# Patient Record
Sex: Male | Born: 1970 | Race: Black or African American | Hispanic: No | Marital: Single | Smoking: Former smoker
Health system: Southern US, Community
[De-identification: ages and names within clinical notes are randomized; demographics above are authoritative.]

## PROBLEM LIST (undated history)

## (undated) ENCOUNTER — Emergency Department (HOSPITAL_COMMUNITY): Admission: EM | Payer: MEDICAID

---

## 1997-06-27 ENCOUNTER — Emergency Department (HOSPITAL_COMMUNITY): Admission: EM | Admit: 1997-06-27 | Discharge: 1997-06-27 | Payer: Self-pay | Admitting: Emergency Medicine

## 1999-05-25 ENCOUNTER — Encounter: Payer: Self-pay | Admitting: Emergency Medicine

## 1999-05-25 ENCOUNTER — Emergency Department (HOSPITAL_COMMUNITY): Admission: EM | Admit: 1999-05-25 | Discharge: 1999-05-25 | Payer: Self-pay | Admitting: Emergency Medicine

## 2000-07-24 ENCOUNTER — Emergency Department (HOSPITAL_COMMUNITY): Admission: EM | Admit: 2000-07-24 | Discharge: 2000-07-24 | Payer: Self-pay | Admitting: Emergency Medicine

## 2000-09-01 ENCOUNTER — Emergency Department (HOSPITAL_COMMUNITY): Admission: EM | Admit: 2000-09-01 | Discharge: 2000-09-01 | Payer: Self-pay | Admitting: Internal Medicine

## 2000-10-16 ENCOUNTER — Emergency Department (HOSPITAL_COMMUNITY): Admission: EM | Admit: 2000-10-16 | Discharge: 2000-10-17 | Payer: Self-pay

## 2001-07-22 ENCOUNTER — Emergency Department (HOSPITAL_COMMUNITY): Admission: EM | Admit: 2001-07-22 | Discharge: 2001-07-22 | Payer: Self-pay | Admitting: Emergency Medicine

## 2002-07-22 ENCOUNTER — Inpatient Hospital Stay (HOSPITAL_COMMUNITY): Admission: EM | Admit: 2002-07-22 | Discharge: 2002-07-23 | Payer: Self-pay | Admitting: Emergency Medicine

## 2002-07-22 ENCOUNTER — Encounter: Payer: Self-pay | Admitting: Emergency Medicine

## 2002-08-06 ENCOUNTER — Emergency Department (HOSPITAL_COMMUNITY): Admission: EM | Admit: 2002-08-06 | Discharge: 2002-08-06 | Payer: Self-pay | Admitting: Emergency Medicine

## 2007-06-12 ENCOUNTER — Encounter: Admission: RE | Admit: 2007-06-12 | Discharge: 2007-06-12 | Payer: Self-pay | Admitting: Internal Medicine

## 2020-05-24 ENCOUNTER — Emergency Department (HOSPITAL_COMMUNITY): Payer: Medicaid Other

## 2020-05-24 ENCOUNTER — Other Ambulatory Visit: Payer: Self-pay

## 2020-05-24 ENCOUNTER — Inpatient Hospital Stay (HOSPITAL_COMMUNITY)
Admission: EM | Admit: 2020-05-24 | Discharge: 2020-06-09 | DRG: 871 | Disposition: A | Discharge status: Home Health | Payer: Medicaid Other | Attending: Internal Medicine | Admitting: Internal Medicine

## 2020-05-24 DIAGNOSIS — J180 Bronchopneumonia, unspecified organism: Secondary | ICD-10-CM | POA: Diagnosis present

## 2020-05-24 DIAGNOSIS — N179 Acute kidney failure, unspecified: Secondary | ICD-10-CM

## 2020-05-24 DIAGNOSIS — A4101 Sepsis due to Methicillin susceptible Staphylococcus aureus: Principal | ICD-10-CM | POA: Diagnosis present

## 2020-05-24 DIAGNOSIS — J869 Pyothorax without fistula: Secondary | ICD-10-CM | POA: Diagnosis present

## 2020-05-24 DIAGNOSIS — E43 Unspecified severe protein-calorie malnutrition: Secondary | ICD-10-CM | POA: Diagnosis present

## 2020-05-24 DIAGNOSIS — Z9889 Other specified postprocedural states: Secondary | ICD-10-CM

## 2020-05-24 DIAGNOSIS — L03114 Cellulitis of left upper limb: Secondary | ICD-10-CM | POA: Diagnosis present

## 2020-05-24 DIAGNOSIS — R509 Fever, unspecified: Secondary | ICD-10-CM

## 2020-05-24 DIAGNOSIS — J918 Pleural effusion in other conditions classified elsewhere: Secondary | ICD-10-CM | POA: Diagnosis present

## 2020-05-24 DIAGNOSIS — J9601 Acute respiratory failure with hypoxia: Secondary | ICD-10-CM

## 2020-05-24 DIAGNOSIS — I76 Septic arterial embolism: Secondary | ICD-10-CM | POA: Diagnosis present

## 2020-05-24 DIAGNOSIS — R6520 Severe sepsis without septic shock: Secondary | ICD-10-CM

## 2020-05-24 DIAGNOSIS — E861 Hypovolemia: Secondary | ICD-10-CM | POA: Diagnosis present

## 2020-05-24 DIAGNOSIS — F17290 Nicotine dependence, other tobacco product, uncomplicated: Secondary | ICD-10-CM | POA: Diagnosis present

## 2020-05-24 DIAGNOSIS — Z4682 Encounter for fitting and adjustment of non-vascular catheter: Secondary | ICD-10-CM

## 2020-05-24 DIAGNOSIS — J15211 Pneumonia due to Methicillin susceptible Staphylococcus aureus: Secondary | ICD-10-CM | POA: Diagnosis present

## 2020-05-24 DIAGNOSIS — E876 Hypokalemia: Secondary | ICD-10-CM | POA: Diagnosis not present

## 2020-05-24 DIAGNOSIS — I82612 Acute embolism and thrombosis of superficial veins of left upper extremity: Secondary | ICD-10-CM | POA: Diagnosis present

## 2020-05-24 DIAGNOSIS — R0902 Hypoxemia: Secondary | ICD-10-CM

## 2020-05-24 DIAGNOSIS — J90 Pleural effusion, not elsewhere classified: Secondary | ICD-10-CM

## 2020-05-24 DIAGNOSIS — Z20822 Contact with and (suspected) exposure to covid-19: Secondary | ICD-10-CM | POA: Diagnosis present

## 2020-05-24 DIAGNOSIS — D649 Anemia, unspecified: Secondary | ICD-10-CM | POA: Diagnosis present

## 2020-05-24 DIAGNOSIS — N1832 Chronic kidney disease, stage 3b: Secondary | ICD-10-CM | POA: Diagnosis present

## 2020-05-24 DIAGNOSIS — F419 Anxiety disorder, unspecified: Secondary | ICD-10-CM | POA: Diagnosis present

## 2020-05-24 DIAGNOSIS — I2690 Septic pulmonary embolism without acute cor pulmonale: Secondary | ICD-10-CM | POA: Diagnosis present

## 2020-05-24 DIAGNOSIS — D631 Anemia in chronic kidney disease: Secondary | ICD-10-CM | POA: Diagnosis present

## 2020-05-24 DIAGNOSIS — E872 Acidosis: Secondary | ICD-10-CM | POA: Diagnosis present

## 2020-05-24 DIAGNOSIS — R7881 Bacteremia: Secondary | ICD-10-CM

## 2020-05-24 DIAGNOSIS — Z681 Body mass index (BMI) 19 or less, adult: Secondary | ICD-10-CM

## 2020-05-24 DIAGNOSIS — E871 Hypo-osmolality and hyponatremia: Secondary | ICD-10-CM | POA: Diagnosis not present

## 2020-05-24 DIAGNOSIS — A419 Sepsis, unspecified organism: Secondary | ICD-10-CM

## 2020-05-24 DIAGNOSIS — I079 Rheumatic tricuspid valve disease, unspecified: Secondary | ICD-10-CM | POA: Diagnosis present

## 2020-05-24 DIAGNOSIS — D696 Thrombocytopenia, unspecified: Secondary | ICD-10-CM | POA: Diagnosis present

## 2020-05-24 DIAGNOSIS — R0602 Shortness of breath: Secondary | ICD-10-CM | POA: Diagnosis present

## 2020-05-24 DIAGNOSIS — B9561 Methicillin susceptible Staphylococcus aureus infection as the cause of diseases classified elsewhere: Secondary | ICD-10-CM

## 2020-05-24 DIAGNOSIS — Z9689 Presence of other specified functional implants: Secondary | ICD-10-CM

## 2020-05-24 DIAGNOSIS — R6521 Severe sepsis with septic shock: Secondary | ICD-10-CM | POA: Diagnosis present

## 2020-05-24 DIAGNOSIS — I39 Endocarditis and heart valve disorders in diseases classified elsewhere: Secondary | ICD-10-CM | POA: Diagnosis present

## 2020-05-24 LAB — RESP PANEL BY RT-PCR (FLU A&B, COVID) ARPGX2
Influenza A by PCR: NEGATIVE
Influenza B by PCR: NEGATIVE
SARS Coronavirus 2 by RT PCR: NEGATIVE

## 2020-05-24 LAB — COMPREHENSIVE METABOLIC PANEL
ALT: 24 U/L (ref 0–44)
AST: 35 U/L (ref 15–41)
Albumin: 2.6 g/dL — ABNORMAL LOW (ref 3.5–5.0)
Alkaline Phosphatase: 56 U/L (ref 38–126)
Anion gap: 13 (ref 5–15)
BUN: 25 mg/dL — ABNORMAL HIGH (ref 6–20)
CO2: 25 mmol/L (ref 22–32)
Calcium: 7.8 mg/dL — ABNORMAL LOW (ref 8.9–10.3)
Chloride: 97 mmol/L — ABNORMAL LOW (ref 98–111)
Creatinine, Ser: 2.68 mg/dL — ABNORMAL HIGH (ref 0.61–1.24)
GFR, Estimated: 28 mL/min — ABNORMAL LOW (ref >60)
Glucose, Bld: 127 mg/dL — ABNORMAL HIGH (ref 70–99)
Potassium: 3.9 mmol/L (ref 3.5–5.1)
Sodium: 135 mmol/L (ref 135–145)
Total Bilirubin: 1.2 mg/dL (ref 0.3–1.2)
Total Protein: 6 g/dL — ABNORMAL LOW (ref 6.5–8.1)

## 2020-05-24 LAB — I-STAT VENOUS BLOOD GAS, ED
Acid-Base Excess: 1 mmol/L (ref 0.0–2.0)
Bicarbonate: 25.1 mmol/L (ref 20.0–28.0)
Calcium, Ion: 0.89 mmol/L — CL (ref 1.15–1.40)
HCT: 49 % (ref 39.0–52.0)
Hemoglobin: 16.7 g/dL (ref 13.0–17.0)
O2 Saturation: 57 %
Potassium: 4.1 mmol/L (ref 3.5–5.1)
Sodium: 131 mmol/L — ABNORMAL LOW (ref 135–145)
TCO2: 26 mmol/L (ref 22–32)
pCO2, Ven: 39 mmHg — ABNORMAL LOW (ref 44.0–60.0)
pH, Ven: 7.417 (ref 7.250–7.430)
pO2, Ven: 29 mmHg — CL (ref 32.0–45.0)

## 2020-05-24 LAB — CBC WITH DIFFERENTIAL/PLATELET
Abs Immature Granulocytes: 0 10*3/uL (ref 0.00–0.07)
Basophils Absolute: 0 10*3/uL (ref 0.0–0.1)
Basophils Relative: 0 %
Eosinophils Absolute: 0 10*3/uL (ref 0.0–0.5)
Eosinophils Relative: 0 %
HCT: 48 % (ref 39.0–52.0)
Hemoglobin: 16.8 g/dL (ref 13.0–17.0)
Lymphocytes Relative: 4 %
Lymphs Abs: 0.3 10*3/uL — ABNORMAL LOW (ref 0.7–4.0)
MCH: 33.3 pg (ref 26.0–34.0)
MCHC: 35 g/dL (ref 30.0–36.0)
MCV: 95.2 fL (ref 80.0–100.0)
Monocytes Absolute: 0.3 10*3/uL (ref 0.1–1.0)
Monocytes Relative: 5 %
Neutro Abs: 5.8 10*3/uL (ref 1.7–7.7)
Neutrophils Relative %: 91 %
Platelets: 207 10*3/uL (ref 150–400)
RBC: 5.04 MIL/uL (ref 4.22–5.81)
RDW: 13.1 % (ref 11.5–15.5)
WBC: 6.4 10*3/uL (ref 4.0–10.5)
nRBC: 0 % (ref 0.0–0.2)
nRBC: 0 /100 WBC

## 2020-05-24 LAB — D-DIMER, QUANTITATIVE: D-Dimer, Quant: 4.66 ug/mL-FEU — ABNORMAL HIGH (ref 0.00–0.50)

## 2020-05-24 LAB — LACTIC ACID, PLASMA
Lactic Acid, Venous: 3.2 mmol/L (ref 0.5–1.9)
Lactic Acid, Venous: 3.7 mmol/L (ref 0.5–1.9)
Lactic Acid, Venous: 3.1 mmol/L (ref 0.5–1.9)

## 2020-05-24 LAB — APTT: aPTT: 28 seconds (ref 24–36)

## 2020-05-24 LAB — TROPONIN I (HIGH SENSITIVITY)
Troponin I (High Sensitivity): 7 ng/L (ref <18)
Troponin I (High Sensitivity): 8 ng/L (ref <18)

## 2020-05-24 LAB — PROTIME-INR
INR: 1.1 (ref 0.8–1.2)
Prothrombin Time: 13.4 seconds (ref 11.4–15.2)

## 2020-05-24 LAB — BRAIN NATRIURETIC PEPTIDE: B Natriuretic Peptide: 104.9 pg/mL — ABNORMAL HIGH (ref 0.0–100.0)

## 2020-05-24 LAB — HIV ANTIBODY (ROUTINE TESTING W REFLEX): HIV Screen 4th Generation wRfx: NONREACTIVE

## 2020-05-24 MED ORDER — POLYETHYLENE GLYCOL 3350 17 G PO PACK: 17.0000 g | PACK | Freq: Every day | ORAL | Status: DC | PRN

## 2020-05-24 MED ORDER — FENTANYL CITRATE (PF) 100 MCG/2ML IJ SOLN: 50.0000 ug | Freq: Once | INTRAMUSCULAR | Status: DC

## 2020-05-24 MED ORDER — SODIUM CHLORIDE 0.9 % IV SOLN
2.0000 g | INTRAVENOUS | Status: DC
  Administered 2020-05-24: 2 g via INTRAVENOUS
  Filled 2020-05-24: qty 2

## 2020-05-24 MED ORDER — HEPARIN SODIUM (PORCINE) 5000 UNIT/ML IJ SOLN
5000.0000 [IU] | Freq: Three times a day (TID) | INTRAMUSCULAR | Status: DC
  Administered 2020-05-25 – 2020-06-09 (×45): 5000 [IU] via SUBCUTANEOUS
  Filled 2020-05-24 (×43): qty 1

## 2020-05-24 MED ORDER — DOCUSATE SODIUM 100 MG PO CAPS: 100.0000 mg | ORAL_CAPSULE | Freq: Two times a day (BID) | ORAL | Status: DC | PRN

## 2020-05-24 MED ORDER — METHYLPREDNISOLONE SODIUM SUCC 125 MG IJ SOLR
125.0000 mg | Freq: Once | INTRAMUSCULAR | Status: AC
  Administered 2020-05-24: 125 mg via INTRAVENOUS
  Filled 2020-05-24: qty 2

## 2020-05-24 MED ORDER — LORAZEPAM 2 MG/ML IJ SOLN
0.5000 mg | Freq: Once | INTRAMUSCULAR | Status: AC
  Administered 2020-05-24: 0.5 mg via INTRAVENOUS
  Filled 2020-05-24: qty 1

## 2020-05-24 MED ORDER — VANCOMYCIN HCL 1250 MG/250ML IV SOLN
1250.0000 mg | Freq: Once | INTRAVENOUS | Status: AC
  Administered 2020-05-24: 1250 mg via INTRAVENOUS
  Filled 2020-05-24: qty 250

## 2020-05-24 MED ORDER — METRONIDAZOLE IN NACL 5-0.79 MG/ML-% IV SOLN
500.0000 mg | Freq: Once | INTRAVENOUS | Status: AC
  Administered 2020-05-24: 500 mg via INTRAVENOUS
  Filled 2020-05-24: qty 100

## 2020-05-24 MED ORDER — SODIUM CHLORIDE 0.9 % IV BOLUS
1000.0000 mL | Freq: Once | INTRAVENOUS | Status: AC
  Administered 2020-05-24: 1000 mL via INTRAVENOUS

## 2020-05-24 MED ORDER — SODIUM CHLORIDE 0.9 % IV SOLN
2.0000 g | Freq: Two times a day (BID) | INTRAVENOUS | Status: DC
  Administered 2020-05-24: 2 g via INTRAVENOUS
  Filled 2020-05-24 (×2): qty 20

## 2020-05-24 MED ORDER — LACTATED RINGERS IV SOLN
INTRAVENOUS | Status: DC
  Administered 2020-05-25: 1 mL via INTRAVENOUS

## 2020-05-24 MED ORDER — LACTATED RINGERS IV BOLUS
2000.0000 mL | Freq: Once | INTRAVENOUS | Status: AC
  Administered 2020-05-24: 2000 mL via INTRAVENOUS

## 2020-05-24 MED ORDER — FENTANYL CITRATE (PF) 100 MCG/2ML IJ SOLN
50.0000 ug | Freq: Once | INTRAMUSCULAR | Status: AC
  Administered 2020-05-24: 50 ug via INTRAVENOUS
  Filled 2020-05-24: qty 2

## 2020-05-24 MED ORDER — ACETAMINOPHEN 325 MG PO TABS
650.0000 mg | ORAL_TABLET | Freq: Four times a day (QID) | ORAL | Status: DC | PRN
  Administered 2020-05-25: 650 mg via ORAL
  Filled 2020-05-24 (×2): qty 2

## 2020-05-24 MED ORDER — RINGERS IV SOLN: INTRAVENOUS | Status: DC

## 2020-05-24 MED ORDER — VANCOMYCIN VARIABLE DOSE PER UNSTABLE RENAL FUNCTION (PHARMACIST DOSING): Status: DC

## 2020-05-24 MED ORDER — DIPHENHYDRAMINE HCL 50 MG/ML IJ SOLN
50.0000 mg | Freq: Once | INTRAMUSCULAR | Status: AC
  Administered 2020-05-24: 50 mg via INTRAVENOUS
  Filled 2020-05-24: qty 1

## 2020-05-24 NOTE — ED Provider Notes (Signed)
  Physical Exam  BP 112/81   Pulse (!) 110   Temp 98.6 F (37 C) (Oral)   Resp (!) 39   Ht 6\' 3"  (1.905 m)   Wt 68 kg   SpO2 100%   BMI 18.75 kg/m   Physical Exam  ED Course/Procedures     Procedures  MDM  Received care from Dr. at 3:30 PM.  Please see his note for prior history, physical and care.  Briefly this is a 50 year old male who presented with concern for shortness of breath.  Presents to the emergency department with normal oxygen saturation, however severe tachypnea.  Labs show lactic acidosis, elevated creatinine.  Bicarb is 25, white blood cell count within normal limits.  pH 7.4 with a PCO2 of 39.  Differential diagnosis given chest x-ray appearance includes multifocal pneumonia or metastatic disease.  Given empiric broad-spectrum antibiotics, however did not order 30 cc/kg of fluid given lactate below 4, blood pressures normal, and suspicion for other possible etiology of lactic acidosis and shortness of breath, including lactic acidosis secondary to respiratory distress with consideration of possible PE.  Consulted critical care given persistent tachypnea and lactic acidosis.  Additional fluids were ordered by critical care with persistent lactic acidosis and tachypnea.  He does not appear to be tiring, I do not see indication for him to [place].  He was admitted for further care.  Given erythema in the left [facility] and appearance of multifocal infection on CT, consider bacteremia possible endocarditis.  Admitted to the ICU for further care     [facility], MD 05/25/20 [phone]

## 2020-05-24 NOTE — ED Triage Notes (Signed)
Pt bib [facility] from home with complaints of mid sternal CP and L arm pain x2 days. Pt donates plasma normally twice a week and last donated on wed this week and noticed redness and pain on Friday. Pt complains of coughing up blood tinged sputum x2 days. Pt presents tachypnic and weak.

## 2020-05-24 NOTE — Consult Note (Signed)
NAME:  Edward Mueller, MRN:  [id number], DOB:  [birth year], LOS: 0 ADMISSION DATE:  05/24/2020, CONSULTATION DATE:  05/24/2020 REFERRING MD: Lockie Mola , CHIEF COMPLAINT: Dyspnea  History of Present Illness:  50 year old man who presents with dyspnea.  Persistent elevated lactate noted.  Patient was in his usual state of health until 48 hours ago (history also confirmed by his sister).  On 4/1, he donated plasma as he has done in the past.  Since then he has had generalized malaise, nausea, vomiting and dyspnea associated with central chest pain on deep breathing.  He is now also coughing up blood tinged sputum.  Pertinent  Medical History  No past medical history on file. Denies significant past medical history.  Significant Hospital Events: Including procedures, antibiotic start and stop dates in addition to other pertinent events   . 3/3-presented to ED  Interim History / Subjective:  Continues to be tachypneic with persistent mild lactic acid elevation despite normotension in the ED after 1 L of fluid.  Objective   Blood pressure 108/76, pulse (!) 113, temperature 98.6 F (37 C), temperature source Oral, resp. rate (!) 38, height 6\' 3"  (1.905 m), weight 68 kg, SpO2 95 %.        Intake/Output Summary (Last 24 hours) at 05/24/2020 2019 Last data filed at 05/24/2020 1930 Gross per 24 hour  Intake 1203.7 ml  Output --  Net 1203.7 ml   Filed Weights   05/24/20 1410  Weight: 68 kg    Examination: General: Thin ill-appearing man. HENT: Pale sclera, no icterus, no subconjunctival hemorrhages.  Mucous membranes are tacky.  Thyroid is normal size and consistency.  No cervical lymphadenopathy. Lungs: Mild tachypnea.  No tracheal tug.  Chest clear to auscultation bilaterally. Cardiovascular: No JVD.  Heart sounds are unremarkable.  Peripheral pulses are intact.  Extremities warm well perfused. Abdomen: Scaphoid abdomen.  No tenderness.  No organomegaly. Extremities: Left antecubital fossa  IV cannulation site without fluctuance.  Diffuse erythema of left upper arm.  No epitrochlear or axillary lymphadenopathy.  Mild tenderness to palpation.  No swollen or tender joints. Neuro: Patient is lethargic and answers in short sentences.  No focal motor deficits. GU: Deferred.  Performed point-of-care echocardiogram: Normal LV size and function.  RV normal.  IVC collapses with inspiration consistent with hypovolemia.  Labs/imaging that I havepersonally reviewed  (right click and "Reselect all SmartList Selections" daily)  Persistent elevated lactate with normal acid-base status. Elevated creatinine with normal electrolytes. No leukocytosis.  CT chest shows multiple circumscribed peripheral infiltrates consistent with embolic phenomena.  Early evidence of cavitation and some of them.  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 Persistent elevated lactate due to fluid under resuscitation. Acute kidney injury due to volume contraction from this as well as possible septic emboli from right-sided endocarditis.  Plan: -Further fluid resuscitation followed by repeat lactate.  If lactate elevation and tachypnea begin to resolve, can admit patient to PCU. -Continue antibiotics at endocarditis dosing-ceftriaxone and vancomycin -Follow blood cultures. -Will need echocardiogram in a.m.  Best practice (right click and "Reselect all SmartList Selections" daily)  Diet:  Oral Pain/Anxiety/Delirium protocol (if indicated): No oral pain meds only VAP protocol (if indicated): Not indicated DVT prophylaxis: LMWH GI prophylaxis: N/A Glucose control:  SSI No Central venous access:  N/A Arterial line:  N/A Foley:  N/A Mobility:  OOB  PT consulted: N/A Last date of multidisciplinary goals of care discussion [n/a] Code Status:  full  code Disposition: PCU  Labs   CBC: Recent Labs  Lab 05/24/20 1417 05/24/20 1512  WBC 6.4  --   NEUTROABS 5.8  --   HGB 16.8 16.7  HCT 48.0 49.0  MCV 95.2  --   PLT 207  --     Basic Metabolic Panel: Recent Labs  Lab 05/24/20 1417 05/24/20 1512  NA 135 131*  K 3.9 4.1  CL 97*  --   CO2 25  --   GLUCOSE 127*  --   BUN 25*  --   CREATININE 2.68*  --   CALCIUM 7.8*  --    GFR: Estimated Creatinine Clearance: 31.7 mL/min (A) (by C-G formula based on SCr of 2.68 mg/dL (H)). Recent Labs  Lab 05/24/20 1417 05/24/20 1630  WBC 6.4  --   LATICACIDVEN 3.2* 3.7*    Liver Function Tests: Recent Labs  Lab 05/24/20 1417  AST 35  ALT 24  ALKPHOS 56  BILITOT 1.2  PROT 6.0*  ALBUMIN 2.6*   No results for input(s): LIPASE, AMYLASE in the last 168 hours. No results for input(s): AMMONIA in the last 168 hours.  ABG    Component Value Date/Time   HCO3 25.1 05/24/2020 1512   TCO2 26 05/24/2020 1512   O2SAT 57.0 05/24/2020 1512     Coagulation Profile: Recent Labs  Lab 05/24/20 1417  INR 1.1    Cardiac Enzymes: No results for input(s): CKTOTAL, CKMB, CKMBINDEX, TROPONINI in the last 168 hours.  HbA1C: No results found for: HGBA1C  CBG: No results for input(s): GLUCAP in the last 168 hours.  Review of Systems:   Review of Systems  Constitutional: Positive for malaise/fatigue. Negative for chills and fever.  HENT: Negative.   Eyes: Negative.   Respiratory: Positive for cough, hemoptysis and shortness of breath.   Cardiovascular: Positive for chest pain.  Gastrointestinal: Positive for nausea and vomiting.  Genitourinary: Negative.   Musculoskeletal: Negative for joint pain.  Skin: Negative.   Neurological: Negative.   Endo/Heme/Allergies: Negative.   Psychiatric/Behavioral: Negative.      Past Medical History:  He,  has no past medical history on file.   Surgical History:   none  Social History:    social alcohol, no cigarettes no drugs.   Works in custodial work.  Family History:  His family history is not on file.   Allergies Not on File   Home Medications  Prior to Admission medications   Not on File    Lynnell Catalan, MD [facility] ICU Physician [facility] Critical Care  Pager: [phone] Or Epic Secure Chat After hours: [phone].  05/24/2020, 8:47 PM

## 2020-05-24 NOTE — Progress Notes (Signed)
Pharmacy Antibiotic Note  Edward Mueller is a 50 y.o. male admitted on 05/24/2020 with sepsis, possible pna source.  Pharmacy has been consulted for vancomycin and cefepime dosing.  Plan: Vancomycin 1250 mg IV x 1, then variable dosing due to unstable renal function Add MRSA PCR Cefepime 2g IV every 24 hours Monitor renal function, Cx/PCR to narrow Vancomycin levels as indicated  Height: 6\' 3"  (190.5 cm) Weight: 68 kg (150 lb) IBW/kg (Calculated) : 84.5  Temp (24hrs), Avg:98.8 F (37.1 C), Min:98.3 F (36.8 C), Max:99.6 F (37.6 C)  Recent Labs  Lab 05/24/20 1417  WBC 6.4  CREATININE 2.68*  LATICACIDVEN 3.2*    Estimated Creatinine Clearance: 31.7 mL/min (A) (by C-G formula based on SCr of 2.68 mg/dL (H)).    Not on File  07/24/20, PharmD Clinical Pharmacist ED Pharmacist Phone # [phone] 05/24/2020 5:12 PM

## 2020-05-24 NOTE — ED Provider Notes (Signed)
[facility] EMERGENCY DEPARTMENT Provider Note   CSN: [id number] Arrival date & time: 05/24/20  1359     History Chief Complaint  Patient presents with  . Shortness of Breath    Edward Mueller is a 50 y.o. male.  Patient states worsening shortness of breath.  Hard to take a deep breath then.  No significant medical history.  No asthma history.  No alcohol or drug use.  Denies IV drug use.  Does give plasma donations twice a week for last bit of time.  Has some redness and pain to the left AC area where he donates plasma.  Denies any urinary symptoms.  Denies any sputum production.  The history is provided by the patient.  Shortness of Breath Severity:  Moderate Onset quality:  Gradual Duration:  2 days Timing:  Constant Progression:  Worsening Chronicity:  New Context: activity   Relieved by:  Nothing Worsened by:  Exertion Associated symptoms: fever   Associated symptoms: no abdominal pain, no chest pain, no cough, no ear pain, no rash, no sore throat and no vomiting   Risk factors: no hx of PE/DVT        No past medical history on file.  There are no problems to display for this patient.      No family history on file.     Home Medications Prior to Admission medications   Not on File    Allergies    Patient has no allergy information on record.  Review of Systems   Review of Systems  Constitutional: Positive for fever. Negative for chills.  HENT: Negative for ear pain and sore throat.   Eyes: Negative for pain and visual disturbance.  Respiratory: Positive for shortness of breath. Negative for cough.   Cardiovascular: Negative for chest pain and palpitations.  Gastrointestinal: Negative for abdominal pain and vomiting.  Genitourinary: Negative for dysuria and hematuria.  Musculoskeletal: Negative for arthralgias and back pain.  Skin: Positive for color change. Negative for rash.  Neurological: Negative for seizures and syncope.   All other systems reviewed and are negative.   Physical Exam Updated Vital Signs BP 136/84 (BP Location: Right Arm)   Pulse 69   Temp 98.3 F (36.8 C) (Oral)   Resp 20   Ht 6\' 3"  (1.905 m)   Wt 68 kg   SpO2 96%   BMI 18.75 kg/m   Physical Exam Vitals and nursing note reviewed.  Constitutional:      General: He is not in acute distress.    Appearance: He is well-developed. He is not ill-appearing.  HENT:     Head: Normocephalic and atraumatic.     Mouth/Throat:     Mouth: Mucous membranes are moist.  Eyes:     Conjunctiva/sclera: Conjunctivae normal.     Pupils: Pupils are equal, round, and reactive to light.  Cardiovascular:     Rate and Rhythm: Regular rhythm. Tachycardia present.     Pulses: Normal pulses.     Heart sounds: Normal heart sounds. No murmur heard.   Pulmonary:     Effort: Tachypnea present. No respiratory distress.     Breath sounds: Decreased breath sounds present.     Comments: Poor air movement throughout Abdominal:     Palpations: Abdomen is soft.     Tenderness: There is no abdominal tenderness.  Musculoskeletal:     Cervical back: Normal range of motion and neck supple.     Right lower leg: No edema.  Left lower leg: No edema.  Skin:    General: Skin is warm and dry.     Capillary Refill: Capillary refill takes less than 2 seconds.     Comments: Some discoloration at left [facility] site but no fluctuance, there appears to be may be some slight streaking from the left [facility] site  Neurological:     General: No focal deficit present.     Mental Status: He is alert.     ED Results / Procedures / Treatments   Labs (all labs ordered are listed, but only abnormal results are displayed) Labs Reviewed  LACTIC ACID, PLASMA - Abnormal; Notable for the following components:      Result Value   Lactic Acid, Venous 3.2 (*)    All other components within normal limits  COMPREHENSIVE METABOLIC PANEL - Abnormal; Notable for the following components:    Chloride 97 (*)    Glucose, Bld 127 (*)    BUN 25 (*)    Creatinine, Ser 2.68 (*)    Calcium 7.8 (*)    Total Protein 6.0 (*)    Albumin 2.6 (*)    GFR, Estimated 28 (*)    All other components within normal limits  CBC WITH DIFFERENTIAL/PLATELET - Abnormal; Notable for the following components:   Lymphs Abs 0.3 (*)    All other components within normal limits  I-STAT VENOUS BLOOD GAS, ED - Abnormal; Notable for the following components:   pCO2, Ven 39.0 (*)    pO2, Ven 29.0 (*)    Sodium 131 (*)    Calcium, Ion 0.89 (*)    All other components within normal limits  URINE CULTURE  CULTURE, BLOOD (ROUTINE X 2)  CULTURE, BLOOD (ROUTINE X 2)  RESP PANEL BY RT-PCR (FLU A&B, COVID) ARPGX2  PROTIME-INR  APTT  LACTIC ACID, PLASMA  URINALYSIS, ROUTINE W REFLEX MICROSCOPIC  BRAIN NATRIURETIC PEPTIDE  D-DIMER, QUANTITATIVE  HIV ANTIBODY (ROUTINE TESTING W REFLEX)    EKG EKG Interpretation  Date/Time:  Sunday May 24 2020 14:07:13 EDT Ventricular Rate:  106 PR Interval:  103 QRS Duration: 102 QT Interval:  315 QTC Calculation: 419 R Axis:   102 Text Interpretation: Sinus tachycardia Right axis deviation Confirmed by Lockie Mola, Jaz Mallick ([id number]) on 05/24/2020 2:15:04 PM   Radiology No results found.  Procedures Procedures   Medications Ordered in ED Medications  fentaNYL (SUBLIMAZE) injection 50 mcg (50 mcg Intravenous Given 05/24/20 1440)    ED Course  I have reviewed the triage vital signs and the nursing notes.  Pertinent labs & imaging results that were available during my care of the patient were reviewed by me and considered in my medical decision making (see chart for details).    MDM Rules/Calculators/A&P                          Edward Mueller is a 50 year old male who presents the ED with shortness of breath, fever.  Vital signs are normal here.  No fever.  Symptoms the last 2 days.  Symptoms get worse.  He is tachypneic, short of breath, diminished breath sounds  throughout.  He does have some discoloration at left [facility] site with some streaking but no fluctuance.  He donates plasma twice a week in this area.  No history of blood clot or ACS.  Denies any alcohol or drug use.  Has been vaccinated and boosted against COVID.  Overall differential is wide.  Could be PE versus pleural  effusion versus pneumonia.  Less likely reactive airway process.  Maybe has a mild cellulitis in his left AC.  No concern for abscess.  Will pursue infectious work-up as well as PE work-up.  EKG shows sinus rhythm.  No ischemic changes.  No chest pain.  No significant leukocytosis.  No significant anemia.  Creatinine is 2.68.  Lactic acid is 3.2.  Blood gas shows no significant acidosis or hypercarbia.  Chest x-ray pending as well as remaining work-up including Covid testing.  Signed out to oncoming ED staff.  Please see their note for further results, evaluation, disposition of the patient.  Suspect may need PE work-up.  This chart was dictated using voice recognition software.  Despite best efforts to proofread,  errors can occur which can change the documentation meaning.    Final Clinical Impression(s) / ED Diagnoses Final diagnoses:  SOB (shortness of breath)    Rx / DC Orders ED Discharge Orders    None       Virgina Norfolk, DO 05/24/20 1530

## 2020-05-24 NOTE — ED Notes (Signed)
RN to room. Patient breaking out in hives on bilateral upper extremities. Provider made aware.

## 2020-05-24 NOTE — ED Notes (Signed)
Lactic Acid 3.2

## 2020-05-25 ENCOUNTER — Inpatient Hospital Stay (HOSPITAL_COMMUNITY): Payer: Medicaid Other

## 2020-05-25 DIAGNOSIS — B9562 Methicillin resistant Staphylococcus aureus infection as the cause of diseases classified elsewhere: Secondary | ICD-10-CM

## 2020-05-25 DIAGNOSIS — L03114 Cellulitis of left upper limb: Secondary | ICD-10-CM

## 2020-05-25 DIAGNOSIS — I39 Endocarditis and heart valve disorders in diseases classified elsewhere: Secondary | ICD-10-CM

## 2020-05-25 DIAGNOSIS — M79602 Pain in left arm: Secondary | ICD-10-CM

## 2020-05-25 DIAGNOSIS — M7989 Other specified soft tissue disorders: Secondary | ICD-10-CM

## 2020-05-25 DIAGNOSIS — R7881 Bacteremia: Secondary | ICD-10-CM

## 2020-05-25 LAB — URINALYSIS, ROUTINE W REFLEX MICROSCOPIC
Bilirubin Urine: NEGATIVE
Glucose, UA: 50 mg/dL — AB
Ketones, ur: NEGATIVE mg/dL
Leukocytes,Ua: NEGATIVE
Nitrite: NEGATIVE
Protein, ur: 100 mg/dL — AB
Specific Gravity, Urine: 1.016 (ref 1.005–1.030)
pH: 5 (ref 5.0–8.0)

## 2020-05-25 LAB — RAPID URINE DRUG SCREEN, HOSP PERFORMED
Amphetamines: NOT DETECTED
Barbiturates: NOT DETECTED
Benzodiazepines: NOT DETECTED
Cocaine: NOT DETECTED
Opiates: NOT DETECTED
Tetrahydrocannabinol: POSITIVE — AB

## 2020-05-25 LAB — BLOOD CULTURE ID PANEL (REFLEXED) - BCID2

## 2020-05-25 LAB — ECHOCARDIOGRAM COMPLETE
AR max vel: 2.6 cm2
AV Area VTI: 2.67 cm2
AV Area mean vel: 2.33 cm2
AV Mean grad: 4.5 mmHg
AV Peak grad: 7.3 mmHg
Ao pk vel: 1.35 m/s
Area-P 1/2: 6.83 cm2
Height: 75 in
S' Lateral: 4.2 cm
Single Plane A4C EF: 33 %
Weight: 2400 oz

## 2020-05-25 LAB — CBC
HCT: 42.2 % (ref 39.0–52.0)
Hemoglobin: 14.5 g/dL (ref 13.0–17.0)
MCH: 33 pg (ref 26.0–34.0)
MCHC: 34.4 g/dL (ref 30.0–36.0)
MCV: 95.9 fL (ref 80.0–100.0)
Platelets: 168 10*3/uL (ref 150–400)
RBC: 4.4 MIL/uL (ref 4.22–5.81)
RDW: 12.9 % (ref 11.5–15.5)
WBC: 3.2 10*3/uL — ABNORMAL LOW (ref 4.0–10.5)
nRBC: 0 % (ref 0.0–0.2)

## 2020-05-25 LAB — BASIC METABOLIC PANEL
Anion gap: 13 (ref 5–15)
BUN: 34 mg/dL — ABNORMAL HIGH (ref 6–20)
CO2: 19 mmol/L — ABNORMAL LOW (ref 22–32)
Calcium: 7.1 mg/dL — ABNORMAL LOW (ref 8.9–10.3)
Chloride: 102 mmol/L (ref 98–111)
Creatinine, Ser: 2.79 mg/dL — ABNORMAL HIGH (ref 0.61–1.24)
GFR, Estimated: 27 mL/min — ABNORMAL LOW (ref >60)
Glucose, Bld: 124 mg/dL — ABNORMAL HIGH (ref 70–99)
Potassium: 4 mmol/L (ref 3.5–5.1)
Sodium: 134 mmol/L — ABNORMAL LOW (ref 135–145)

## 2020-05-25 LAB — MRSA PCR SCREENING: MRSA by PCR: NEGATIVE

## 2020-05-25 LAB — MAGNESIUM: Magnesium: 1.7 mg/dL (ref 1.7–2.4)

## 2020-05-25 MED ORDER — OXYCODONE HCL 5 MG PO TABS
5.0000 mg | ORAL_TABLET | ORAL | Status: DC | PRN
  Administered 2020-05-29 – 2020-06-03 (×12): 5 mg via ORAL
  Filled 2020-05-25 (×12): qty 1

## 2020-05-25 MED ORDER — CALCIUM GLUCONATE-NACL 1-0.675 GM/50ML-% IV SOLN
1.0000 g | Freq: Once | INTRAVENOUS | Status: AC
  Administered 2020-05-25: 1000 mg via INTRAVENOUS
  Filled 2020-05-25: qty 50

## 2020-05-25 MED ORDER — ACETAMINOPHEN 325 MG PO TABS
650.0000 mg | ORAL_TABLET | Freq: Four times a day (QID) | ORAL | Status: DC | PRN
  Administered 2020-05-25 – 2020-06-09 (×25): 650 mg via ORAL
  Filled 2020-05-25 (×26): qty 2

## 2020-05-25 MED ORDER — PERFLUTREN LIPID MICROSPHERE
1.0000 mL | INTRAVENOUS | Status: AC | PRN
  Administered 2020-05-25: 1 mL via INTRAVENOUS
  Filled 2020-05-25: qty 10

## 2020-05-25 MED ORDER — CEFAZOLIN SODIUM-DEXTROSE 2-4 GM/100ML-% IV SOLN
2.0000 g | Freq: Three times a day (TID) | INTRAVENOUS | Status: DC
  Administered 2020-05-25 – 2020-06-09 (×45): 2 g via INTRAVENOUS
  Filled 2020-05-25 (×51): qty 100

## 2020-05-25 MED ORDER — FENTANYL CITRATE (PF) 100 MCG/2ML IJ SOLN
50.0000 ug | INTRAMUSCULAR | Status: DC | PRN
  Administered 2020-05-25: 50 ug via INTRAVENOUS
  Filled 2020-05-25: qty 2

## 2020-05-25 NOTE — Progress Notes (Signed)
Pharmacy Antibiotic Note  Edward Mueller is a 50 y.o. male admitted on 05/24/2020 with sepsis found to have MSSA bacteremia and likely septic pulmonary emboli and concern of bacteremia.  Pharmacy has been consulted for cefazolin dosing.  Scr 2.79 - unknown baseline. CrCl ~31 ml/min. Tmax 101.1. WBC 3.2.   Plan: Start cefazolin 2g IV q8h for CrCl > 30 Monitor renal function, cultures, and clinical progression  Height: 6\' 3"  (190.5 cm) Weight: 68 kg (150 lb) IBW/kg (Calculated) : 84.5  Temp (24hrs), Avg:99.4 F (37.4 C), Min:98.3 F (36.8 C), Max:101.1 F (38.4 C)  Recent Labs  Lab 05/24/20 1417 05/24/20 1630 05/24/20 2146 05/25/20 0218  WBC 6.4  --   --  3.2*  CREATININE 2.68*  --   --  2.79*  LATICACIDVEN 3.2* 3.7* 3.1*  --     Estimated Creatinine Clearance: 30.5 mL/min (A) (by C-G formula based on SCr of 2.79 mg/dL (H)).    No Known Allergies  Antimicrobials this admission: Vancomycin 4/3 >> 4/4  Ceftriaxone 4/3 >> 4/4 Cefepime x1 4/3  Metronidazole x1 4/3 Cefazolin 4/4 >>  Dose adjustments this admission: N/a  Microbiology results: 4/3 Bcx: 2/6 MSSA 4/3 UCx:  Thank you for allowing pharmacy to be a part of this patient's care.  6/3, PharmD Clinical Pharmacist  05/25/2020 9:42 AM

## 2020-05-25 NOTE — Progress Notes (Signed)
   05/25/20 1506  Assess: MEWS Score  Temp 98.5 F (36.9 C)  BP 121/84  Pulse Rate (!) 105  ECG Heart Rate (!) 106  Resp (!) 25  Level of Consciousness Alert  SpO2 95 %  O2 Device Room Air  Assess: MEWS Score  MEWS Temp 0  MEWS Systolic 0  MEWS Pulse 1  MEWS RR 1  MEWS LOC 0  MEWS Score 2  MEWS Score Color Yellow  Assess: if the MEWS score is Yellow or Red  Were vital signs taken at a resting state? Yes  Focused Assessment No change from prior assessment  Early Detection of Sepsis Score *See Row Information* High  MEWS guidelines implemented *See Row Information* Yes  Treat  Pain Scale 0-10  Pain Score 0  Take Vital Signs  Increase Vital Sign Frequency  Yellow: Q 2hr X 2 then Q 4hr X 2, if remains yellow, continue Q 4hrs  Escalate  MEWS: Escalate Yellow: discuss with charge nurse/RN and consider discussing with provider and RRT  Notify: Charge Nurse/RN  Name of Charge Nurse/RN Notified Kristen  Date Charge Nurse/RN Notified 05/25/20  Time Charge Nurse/RN Notified 1521  Document  Patient Outcome Stabilized after interventions  Progress note created (see row info) Yes

## 2020-05-25 NOTE — Consult Note (Addendum)
This is a progress note  NAME:  Edward Mueller, MRN:  [id number], DOB:  09/18/1970, LOS: 1 ADMISSION DATE:  05/24/2020, CONSULTATION DATE:  05/24/2020 REFERRING MD: Lockie Mola , CHIEF COMPLAINT: Dyspnea  History of Present Illness:  50 year old man who presents with dyspnea.  Persistent elevated lactate noted.  Patient was in his usual state of health until 48 hours ago (history also confirmed by his sister).  On 4/1, he donated plasma as he has done in the past.  Since then he has had generalized malaise, nausea, vomiting and dyspnea associated with central chest pain on deep breathing.  He is now also coughing up blood tinged sputum.  Pertinent  Medical History  No past medical history on file. Denies significant past medical history.  Significant Hospital Events: Including procedures, antibiotic start and stop dates in addition to other pertinent events   . 3/3-presented to ED  Interim History / Subjective:  Feeling a bit better this AM. Wants to eat.  Objective   Blood pressure 117/82, pulse (!) 110, temperature 99.8 F (37.7 C), temperature source Oral, resp. rate (!) 40, height 6\' 3"  (1.905 m), weight 68 kg, SpO2 96 %.        Intake/Output Summary (Last 24 hours) at 05/25/2020 0741 Last data filed at 05/25/2020 0002 Gross per 24 hour  Intake 3787.51 ml  Output 400 ml  Net 3387.51 ml   Filed Weights   05/24/20 1410  Weight: 68 kg    Examination: Constitutional: thin ill appearing man laying in bed  Eyes: EOMI, pupils equal Ears, nose, mouth, and throat: trachea midline, MM dry Cardiovascular: tachycardic, ext warm Respiratory: tachypneic, no wheezing Gastrointestinal: soft, +BS Skin: LUE AC appears swollen with obvious phlebitis Neurologic: moves all 4 ext to command Psychiatric: RASS 0  Performed point-of-care echocardiogram: Normal LV size and function.  RV normal.  IVC collapses with inspiration consistent with hypovolemia.  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  Acute kidney injury due to volume contraction from this as well as possible septic emboli from right-sided endocarditis.  - Vanc per pharmacy, appreciate help - Continue IVF, watch Cr - Okay for cardiac diet - f/u echo - Check LUE duplex r/o associated DVT - ID will likely reflex consult but will give them heads up - Fine for PCU, appreciate TRH taking over care  Best practice (right click and "Reselect all SmartList Selections" daily)  Diet:  Oral Pain/Anxiety/Delirium protocol (if indicated): No oral pain meds only VAP protocol (if indicated): Not indicated DVT prophylaxis: LMWH GI prophylaxis: N/A Glucose control:  SSI No Central venous access:  N/A Arterial line:  N/A Foley:  N/A Mobility:  OOB  PT consulted: N/A Last date of multidisciplinary goals of care discussion [n/a] Code Status:  full code Disposition: PCU  07/24/20 MD PCCM

## 2020-05-25 NOTE — ED Notes (Signed)
Patient very tachypneic at baseline. Provider made aware. Recommended BiPap. Provider to reevaluate patient.

## 2020-05-25 NOTE — ED Notes (Signed)
I called lab to add mg and phosphorus

## 2020-05-25 NOTE — Consult Note (Signed)
[facility]    Date of Admission:  05/24/2020     Total days of antibiotics                Reason for Consult: MSSA Bacteremia   Referring Provider: Candy Sledge / [employer] Primary Care Provider: Pcp, No   ASSESSMENT:  Edward Mueller is a 50 y/o male with cellulitis of the left upper extremity complicated by MSSA bacteremia and likely septic pulmonary emboli. Source of infection likely the left upper arm however this is unlikely to explain the septic emboli. Certainly a showering of emboli is possible from endocarditis which could have infected his left upper extremity as well and meets [facility] Criteria. It would be odd to have septic emboli/endocarditis this quickly if the left upper extremity is the nidus of infection. Will need TEE to evaluate further for endocarditis. Narrow antibiotics to Cefazolin. Repeat blood cultures to ensure clearance of bacteremia. Discussed that he will need a prolonged course of antibiotics with duration to be determined by additional test results.   PLAN:  1. Narrow antibiotics to Cefazolin.  2. Repeat blood cultures to determine clearance of bacteremia. 3. TEE when able to rule out endocarditis. 4. Hold central line placement pending clearance of blood cultures.    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 fentaNYL (SUBLIMAZE) injection  50 mcg Intravenous Once  . heparin  5,000 Units Subcutaneous Q8H     HPI: Edward Mueller is a 50 y.o. male with no significant previous medical history presented to the ED with worsening shortness of breath and redness of his left arm after donating plasma on Friday.  Edward Mueller was in his normal state of health until Saturday morning when he began having redness and swelling of his left arm and noted an acute onset cough with centralized chest pain with deep breathing and has blood tinged sputum. No previous attempted treatments prior  to arrival to the ED.   Chest x-ray with multilobular pneumonia and upper left lobe nodularity. CT chest with marked severity left upper lobe and bilateral lower lobe infiltrates with innumberable bilateral nodular appearing areas believed to be in part infectious etiology, pulmonary metastasis could not be excluded. Elevated temperature to 100.3 over the past 24 hours. WBC count of 3.2. Blood cultures now positive for MSSA. Initially received vancomycin, metronidazole, cefepime and ceftriaxone. TTE without evidence of endocarditis.   Edward Mueller is having continued shortness of breath with deep breathing and producing blood tinged sputum. Left arm is red with streaking and has questions of how long it will take this to improve.   Review of Systems: Review of Systems  Constitutional: Positive for malaise/fatigue. Negative for chills, fever and weight loss.  Respiratory: Positive for cough, hemoptysis and sputum production. Negative for shortness of breath and wheezing.   Cardiovascular: Negative for chest pain and leg swelling.  Gastrointestinal: Negative for abdominal pain, constipation, diarrhea, nausea and vomiting.  Skin: Negative for rash.       Redness of left arm     No past medical history on file.     No family history on file.  No Known Allergies  OBJECTIVE: Blood pressure 106/77, pulse (!) 105, temperature 100.3 F (37.9 C), temperature source Oral, resp. rate (!) 29, height 6\' 3"  (1.905 m), weight 68 kg, SpO2 95 %.  Physical Exam Constitutional:      General: He is not in acute distress.  Appearance: He is well-developed. He is not ill-appearing or diaphoretic.  Cardiovascular:     Rate and Rhythm: Normal rate and regular rhythm.     Heart sounds: Normal heart sounds.  Pulmonary:     Effort: Pulmonary effort is normal.     Breath sounds: Normal breath sounds.  Skin:    General: Skin is warm and dry.     Comments: Left arm erythema with streaking proximally.  Puncture wound noted in anticubital fossa.   Neurological:     Mental Status: He is alert and oriented to person, place, and time.  Psychiatric:        Behavior: Behavior normal.        Thought Content: Thought content normal.        Judgment: Judgment normal.       Lab Results Lab Results  Component Value Date   WBC 3.2 (L) 05/25/2020   HGB 14.5 05/25/2020   HCT 42.2 05/25/2020   MCV 95.9 05/25/2020   PLT 168 05/25/2020    Lab Results  Component Value Date   CREATININE 2.79 (H) 05/25/2020   BUN 34 (H) 05/25/2020   NA 134 (L) 05/25/2020   K 4.0 05/25/2020   CL 102 05/25/2020   CO2 19 (L) 05/25/2020    Lab Results  Component Value Date   ALT 24 05/24/2020   AST 35 05/24/2020   ALKPHOS 56 05/24/2020   BILITOT 1.2 05/24/2020     Microbiology: Recent Results (from the past 240 hour(s))  Blood Culture (routine x 2)     Status: None (Preliminary result)   Collection Time: 05/24/20  3:08 PM   Specimen: Left Antecubital; Blood  Result Value Ref Range Status   Specimen Description LEFT ANTECUBITAL  Final   Special Requests   Final    BOTTLES DRAWN AEROBIC AND ANAEROBIC Blood Culture results may not be optimal due to an inadequate volume of blood received in culture bottles   Culture  Setup Time   Final    GRAM POSITIVE COCCI IN CLUSTERS IN BOTH AEROBIC AND ANAEROBIC BOTTLES CRITICAL RESULT CALLED TO, READ BACK BY AND VERIFIED WITH: PHARMD A WOLF [id number] AT 754 AM BY CM Performed at [facility], [address] [place], [place] [address]    Culture GRAM POSITIVE COCCI IN CLUSTERS  Final   Report Status PENDING  Incomplete  Blood Culture ID Panel (Reflexed)     Status: Abnormal   Collection Time: 05/24/20  3:08 PM  Result Value Ref Range Status   Enterococcus faecalis NOT DETECTED NOT DETECTED Final   Enterococcus Faecium NOT DETECTED NOT DETECTED Final   Listeria monocytogenes NOT DETECTED NOT DETECTED Final   Staphylococcus species DETECTED (A) NOT DETECTED  Final    Comment: CRITICAL RESULT CALLED TO, READ BACK BY AND VERIFIED WITH: PHARMD A WOLF [id number] AT 800 BY CM    Staphylococcus aureus (BCID) DETECTED (A) NOT DETECTED Final    Comment: CRITICAL RESULT CALLED TO, READ BACK BY AND VERIFIED WITH: PHARMD A WOLF [id number] AT 800 BY CM    Staphylococcus epidermidis NOT DETECTED NOT DETECTED Final   Staphylococcus lugdunensis NOT DETECTED NOT DETECTED Final   Streptococcus species NOT DETECTED NOT DETECTED Final   Streptococcus agalactiae NOT DETECTED NOT DETECTED Final   Streptococcus pneumoniae NOT DETECTED NOT DETECTED Final   Streptococcus pyogenes NOT DETECTED NOT DETECTED Final   A.calcoaceticus-baumannii NOT DETECTED NOT DETECTED Final   Bacteroides fragilis NOT DETECTED NOT DETECTED Final   Enterobacterales  NOT DETECTED NOT DETECTED Final   Enterobacter cloacae complex NOT DETECTED NOT DETECTED Final   Escherichia coli NOT DETECTED NOT DETECTED Final   Klebsiella aerogenes NOT DETECTED NOT DETECTED Final   Klebsiella oxytoca NOT DETECTED NOT DETECTED Final   Klebsiella pneumoniae NOT DETECTED NOT DETECTED Final   Proteus species NOT DETECTED NOT DETECTED Final   Salmonella species NOT DETECTED NOT DETECTED Final   Serratia marcescens NOT DETECTED NOT DETECTED Final   Haemophilus influenzae NOT DETECTED NOT DETECTED Final   Neisseria meningitidis NOT DETECTED NOT DETECTED Final   Pseudomonas aeruginosa NOT DETECTED NOT DETECTED Final   Stenotrophomonas maltophilia NOT DETECTED NOT DETECTED Final   Candida albicans NOT DETECTED NOT DETECTED Final   Candida auris NOT DETECTED NOT DETECTED Final   Candida glabrata NOT DETECTED NOT DETECTED Final   Candida krusei NOT DETECTED NOT DETECTED Final   Candida parapsilosis NOT DETECTED NOT DETECTED Final   Candida tropicalis NOT DETECTED NOT DETECTED Final   Cryptococcus neoformans/gattii NOT DETECTED NOT DETECTED Final   Meth resistant mecA/C and MREJ NOT DETECTED NOT DETECTED Final     Comment: Performed at [facility], [address] [place], [place] [address]  Resp Panel by RT-PCR (Flu A&B, Covid) Nasopharyngeal Swab     Status: None   Collection Time: 05/24/20  3:14 PM   Specimen: Nasopharyngeal Swab; Nasopharyngeal(NP) swabs in vial transport medium  Result Value Ref Range Status   SARS Coronavirus 2 by RT PCR NEGATIVE NEGATIVE Final    Comment: (NOTE) SARS-CoV-2 target nucleic acids are NOT DETECTED.  The SARS-CoV-2 RNA is generally detectable in upper respiratory specimens during the acute phase of infection. The lowest concentration of SARS-CoV-2 viral copies this assay can detect is 138 copies/mL. A negative result does not preclude SARS-Cov-2 infection and should not be used as the sole basis for treatment or other patient management decisions. A negative result may occur with  improper specimen collection/handling, submission of specimen other than nasopharyngeal swab, presence of viral mutation(s) within the areas targeted by this assay, and inadequate number of viral copies(<138 copies/mL). A negative result must be combined with clinical observations, patient history, and epidemiological information. The expected result is Negative.  Fact Sheet for Patients:  BloggerCourse.com  Fact Sheet for Healthcare Providers:  SeriousBroker.it  This test is no t yet approved or cleared by the [place] FDA and  has been authorized for detection and/or diagnosis of SARS-CoV-2 by FDA under an Emergency Use Authorization (EUA). This EUA will remain  in effect (meaning this test can be used) for the duration of the COVID-19 declaration under Section 564(b)(1) of the Act, 21 U.S.C.section 360bbb-3(b)(1), unless the authorization is terminated  or revoked sooner.       Influenza A by PCR NEGATIVE NEGATIVE Final   Influenza B by PCR NEGATIVE NEGATIVE Final    Comment: (NOTE) The Xpert Xpress  SARS-CoV-2/FLU/RSV plus assay is intended as an aid in the diagnosis of influenza from Nasopharyngeal swab specimens and should not be used as a sole basis for treatment. Nasal washings and aspirates are unacceptable for Xpert Xpress SARS-CoV-2/FLU/RSV testing.  Fact Sheet for Patients: BloggerCourse.com  Fact Sheet for Healthcare Providers: SeriousBroker.it  This test is not yet approved or cleared by the [place] FDA and has been authorized for detection and/or diagnosis of SARS-CoV-2 by FDA under an Emergency Use Authorization (EUA). This EUA will remain in effect (meaning this test can be used) for the duration of the COVID-19  declaration under Section 564(b)(1) of the Act, 21 U.S.C. section 360bbb-3(b)(1), unless the authorization is terminated or revoked.  Performed at [facility], [address] [place], [place] [address]   MRSA PCR Screening     Status: None   Collection Time: 05/24/20 11:46 PM   Specimen: Nasopharyngeal  Result Value Ref Range Status   MRSA by PCR NEGATIVE NEGATIVE Final    Comment:        The GeneXpert MRSA Assay (FDA approved for NASAL specimens only), is one component of a comprehensive MRSA colonization surveillance program. It is not intended to diagnose MRSA infection nor to guide or monitor treatment for MRSA infections. Performed at [facility], [address] [place], [place] [address]      Marcos EkeGreg Johnta Couts, NP [facility]  05/25/2020  10:42 AM

## 2020-05-25 NOTE — Plan of Care (Signed)

## 2020-05-25 NOTE — ED Notes (Signed)
Heart echo in process via echo tech

## 2020-05-25 NOTE — Progress Notes (Signed)
PHARMACY - PHYSICIAN COMMUNICATION CRITICAL VALUE ALERT - BLOOD CULTURE IDENTIFICATION (BCID)  Edward Mueller is an 49 y.o. male who presented to [facility] on 05/24/2020 with a chief complaint of dyspnea. Patient with likely septic pulmonary emboli and concern for R-sided endocarditis. Bcx now growing GPC in clusters in 1 of 3 sets (2 of 6 bottles). BCID detected S. Aureus without resistance.   Assessment: Bcx 1/3 sets MSSA  Name of physician (or Provider) Contacted: Sharin Mons, PharmD  Current antibiotics: Vancomycin + Ceftriaxone  Changes to prescribed antibiotics recommended:  Recommend ID consult and deescalating to cefazolin  No results found for this or any previous visit.  Margarite Gouge, PharmD PGY2 ID Pharmacy Resident Phone between 7 am - 3:30 pm: [phone]  Please check AMION for all [facility] Pharmacy phone numbers After 10:00 PM, call Main Pharmacy [phone]  05/25/2020  7:58 AM

## 2020-05-25 NOTE — ED Notes (Signed)
Moved from Bed 19 to bed 5, LR infusing at 125cc/hr to RFA, on monitor in ST with no ectopy, no acute distress, c/o back pain - which is new for him, left arm with redness and swelling around anticubital area - states it is infected from giving plasma

## 2020-05-25 NOTE — CV Procedure (Signed)
LUE venous duplex completed. Urinal emptied of of urine.  Attempted to given preliminary results to RN, on the phone and would not take a message. Dr. Katrinka Blazing paged and called back immediately at 1430, preliminary findings given.  Results can be found under chart review under CV PROC. 05/25/2020 2:32 PM Kaliyah Gladman RVT, RDMS

## 2020-05-25 NOTE — ED Notes (Signed)
Tried to call report, nurse to call me back, pt just returned from Vasc for upper ext [place]

## 2020-05-25 NOTE — Progress Notes (Signed)
   05/25/20 1745  Assess: MEWS Score  Temp (!) 101.8 F (38.8 C)  BP 122/76  Pulse Rate (!) 114  ECG Heart Rate (!) 115  Resp (!) 22  Level of Consciousness Alert  SpO2 95 %  O2 Device Room Air  Assess: MEWS Score  MEWS Temp 2  MEWS Systolic 0  MEWS Pulse 2  MEWS RR 1  MEWS LOC 0  MEWS Score 5  MEWS Score Color Red  Assess: if the MEWS score is Yellow or Red  Were vital signs taken at a resting state? Yes  Focused Assessment No change from prior assessment  Early Detection of Sepsis Score *See Row Information* High  MEWS guidelines implemented *See Row Information* Yes  Treat  MEWS Interventions Administered prn meds/treatments  Pain Scale 0-10  Pain Score 4  Pain Type Acute pain  Pain Location Chest  Pain Orientation Mid;Upper  Pain Descriptors / Indicators Sharp  Pain Frequency Intermittent  Pain Onset Other (Comment) (with inspiration and coughing)  Patients Stated Pain Goal 2  Pain Intervention(s) RN made aware;Cold applied;Repositioned;Refused;Relaxation;Rest  Take Vital Signs  Increase Vital Sign Frequency  Red: Q 1hr X 4 then Q 4hr X 4, if remains red, continue Q 4hrs  Escalate  MEWS: Escalate Red: discuss with charge nurse/RN and provider, consider discussing with RRT  Notify: Charge Nurse/RN  Name of Charge Nurse/RN Notified Kristen  Date Charge Nurse/RN Notified 05/25/20  Time Charge Nurse/RN Notified 1750  Notify: Provider  Provider Name/Title Levon Hedger  Date Provider Notified 05/25/20  Time Provider Notified 1800  Notification Type Page  Notification Reason Other (Comment) (red MEWS)  Provider response See new orders  Date of Provider Response 05/25/20  Time of Provider Response 1815  Notify: Rapid Response  Name of Rapid Response RN Notified Hellen  Date Rapid Response Notified 05/25/20  Time Rapid Response Notified 1755  Document  Patient Outcome Stabilized after interventions  Progress note created (see row info) Yes

## 2020-05-26 ENCOUNTER — Encounter (HOSPITAL_COMMUNITY): Payer: Self-pay | Admitting: Pulmonary Disease

## 2020-05-26 ENCOUNTER — Inpatient Hospital Stay (HOSPITAL_COMMUNITY): Payer: Medicaid Other

## 2020-05-26 DIAGNOSIS — R0682 Tachypnea, not elsewhere classified: Secondary | ICD-10-CM

## 2020-05-26 LAB — BASIC METABOLIC PANEL
Anion gap: 10 (ref 5–15)
BUN: 42 mg/dL — ABNORMAL HIGH (ref 6–20)
CO2: 23 mmol/L (ref 22–32)
Calcium: 7.5 mg/dL — ABNORMAL LOW (ref 8.9–10.3)
Chloride: 100 mmol/L (ref 98–111)
Creatinine, Ser: 2.53 mg/dL — ABNORMAL HIGH (ref 0.61–1.24)
GFR, Estimated: 30 mL/min — ABNORMAL LOW (ref >60)
Glucose, Bld: 107 mg/dL — ABNORMAL HIGH (ref 70–99)
Potassium: 3.7 mmol/L (ref 3.5–5.1)
Sodium: 133 mmol/L — ABNORMAL LOW (ref 135–145)

## 2020-05-26 LAB — CBC
HCT: 39.1 % (ref 39.0–52.0)
Hemoglobin: 13.7 g/dL (ref 13.0–17.0)
MCH: 32.1 pg (ref 26.0–34.0)
MCHC: 35 g/dL (ref 30.0–36.0)
MCV: 91.6 fL (ref 80.0–100.0)
Platelets: 180 10*3/uL (ref 150–400)
RBC: 4.27 MIL/uL (ref 4.22–5.81)
RDW: 12.8 % (ref 11.5–15.5)
WBC: 5.3 10*3/uL (ref 4.0–10.5)
nRBC: 0 % (ref 0.0–0.2)

## 2020-05-26 LAB — URINE CULTURE: Culture: NO GROWTH

## 2020-05-26 LAB — MAGNESIUM: Magnesium: 2.1 mg/dL (ref 1.7–2.4)

## 2020-05-26 LAB — PHOSPHORUS: Phosphorus: 3.5 mg/dL (ref 2.5–4.6)

## 2020-05-26 MED ORDER — SODIUM CHLORIDE 0.9 % IV SOLN: INTRAVENOUS | Status: DC

## 2020-05-26 NOTE — Plan of Care (Signed)
  Problem: Education: Goal: Knowledge of General Education information will improve Description: Including pain rating scale, medication(s)/side effects and non-pharmacologic comfort measures 05/26/2020 0757 by Durward Fortes, RN Outcome: Progressing 05/26/2020 0756 by Durward Fortes, RN Outcome: Progressing   Problem: Health Behavior/Discharge Planning: Goal: Ability to manage health-related needs will improve 05/26/2020 0757 by Durward Fortes, RN Outcome: Progressing 05/26/2020 0756 by Durward Fortes, RN Outcome: Progressing   Problem: Clinical Measurements: Goal: Ability to maintain clinical measurements within normal limits will improve 05/26/2020 0757 by Durward Fortes, RN Outcome: Progressing 05/26/2020 0756 by Durward Fortes, RN Outcome: Progressing Goal: Will remain free from infection 05/26/2020 0757 by Durward Fortes, RN Outcome: Progressing 05/26/2020 0756 by Durward Fortes, RN Outcome: Progressing Goal: Diagnostic test results will improve 05/26/2020 0757 by Durward Fortes, RN Outcome: Progressing 05/26/2020 0756 by Durward Fortes, RN Outcome: Progressing Goal: Respiratory complications will improve 05/26/2020 0757 by Durward Fortes, RN Outcome: Progressing 05/26/2020 0756 by Durward Fortes, RN Outcome: Progressing Goal: Cardiovascular complication will be avoided 05/26/2020 0757 by Durward Fortes, RN Outcome: Progressing 05/26/2020 0756 by Durward Fortes, RN Outcome: Progressing   Problem: Activity: Goal: Risk for activity intolerance will decrease 05/26/2020 0757 by Durward Fortes, RN Outcome: Progressing 05/26/2020 0756 by Durward Fortes, RN Outcome: Progressing   Problem: Nutrition: Goal: Adequate nutrition will be maintained 05/26/2020 0757 by Durward Fortes, RN Outcome: Progressing 05/26/2020 0756 by Durward Fortes, RN Outcome: Progressing   Problem: Coping: Goal: Level of anxiety will decrease 05/26/2020 0757 by Durward Fortes, RN Outcome: Progressing 05/26/2020 0756 by Durward Fortes, RN Outcome: Progressing   Problem: Elimination: Goal: Will not experience complications related to bowel motility 05/26/2020 0757 by Durward Fortes, RN Outcome: Progressing 05/26/2020 0756 by Durward Fortes, RN Outcome: Progressing Goal: Will not experience complications related to urinary retention 05/26/2020 0757 by Durward Fortes, RN Outcome: Progressing 05/26/2020 0756 by Durward Fortes, RN Outcome: Progressing   Problem: Pain Managment: Goal: General experience of comfort will improve 05/26/2020 0757 by Durward Fortes, RN Outcome: Progressing 05/26/2020 0756 by Durward Fortes, RN Outcome: Progressing   Problem: Safety: Goal: Ability to remain free from injury will improve 05/26/2020 0757 by Durward Fortes, RN Outcome: Progressing 05/26/2020 0756 by Durward Fortes, RN Outcome: Progressing   Problem: Skin Integrity: Goal: Risk for impaired skin integrity will decrease 05/26/2020 0757 by Durward Fortes, RN Outcome: Progressing 05/26/2020 0756 by Durward Fortes, RN Outcome: Progressing   Problem: Fluid Volume: Goal: Hemodynamic stability will improve 05/26/2020 0757 by Durward Fortes, RN Outcome: Progressing 05/26/2020 0756 by Durward Fortes, RN Outcome: Progressing   Problem: Clinical Measurements: Goal: Diagnostic test results will improve 05/26/2020 0757 by Durward Fortes, RN Outcome: Progressing 05/26/2020 0756 by Durward Fortes, RN Outcome: Progressing Goal: Signs and symptoms of infection will decrease 05/26/2020 0757 by Durward Fortes, RN Outcome: Progressing 05/26/2020 0756 by Durward Fortes, RN Outcome: Progressing   Problem: Respiratory: Goal: Ability to maintain adequate ventilation will improve 05/26/2020 0757 by Durward Fortes, RN Outcome: Progressing 05/26/2020 0756 by Durward Fortes, RN Outcome: Progressing

## 2020-05-26 NOTE — Plan of Care (Signed)

## 2020-05-26 NOTE — H&P (View-Only) (Signed)
PROGRESS NOTE    Edward Mueller  ZOX:[id number] DOB: Mar 13, 1970 DOA: 05/24/2020 PCP: Pcp, No    Brief Narrative:  50 year old gentleman with no reported medical problems who donates plasma 2 times a week, donated plasma on 4/1 and after about 24 hours he started not feeling well, generalized malaise, nausea vomiting and shortness of breath and pleuritic chest pain.  Cough with blood-tinged sputum.  Came to the emergency room on 4/3, was found to be febrile and tachypneic with lactic acidosis.  Resuscitated and admitted to ICU then transferred to progressive care unit with stable blood pressures. In the emergency room, tachypneic but on room air.  Temperature one 1.8.  Lactic acid 3.1.  Blood pressures adequate.  CT scan with extensive pneumonia.  Left arm with minimal cellulitis.  Ultimately blood cultures growing MSSA bacteremia.   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  Septic emboli and multilobar pneumonia: Primary source of infection unknown, denies any injectable drug use. He does donate plasma twice a day, symptoms started after needle prick at plasma donation.  Left arm cellulitis is very mild. Initially on broad-spectrum antibiotics, currently remains on Ancef.  Followed by infectious disease.  Repeat blood cultures negative so far. Start mobilizing.  Start doing incentive spirometry and deep breathing exercises.  Start flutter valve therapies. Sputum cultures, Legionella and pneumococcal pending.  Likely MSSA. 2D echocardiogram did not show any evidence of vegetations, discussed with cardiology about doing TEE possibly tomorrow.  Will need prolonged antibiotics.  Left hand cellulitis: He had some redness on the left elbow on presentation, clearing up.  Renal failure: Unknown whether acute or chronic.  Mild improvement in  renal functions.  Urine output is adequate.  850 mL last 24 hours.   Urinalysis was clean.  Urine cultures with no growth.  Will check renal ultrasound to evaluate for any evidence of chronic kidney disease.   DVT prophylaxis: heparin injection 5,000 Units Start: 05/24/20 2330 SCDs Start: 05/24/20 2315   Code Status: Full code. Family Communication: None at the bedside. Called both sisters , they were unable to pick up the phone. Disposition Plan: Status is: Inpatient  Remains inpatient appropriate because:IV treatments appropriate due to intensity of illness or inability to take PO and Inpatient level of care appropriate due to severity of illness   Dispo: The patient is from: Home              Anticipated d/c is to: Home              Patient currently is not medically stable to d/c.   Difficult to place patient No         Consultants:   PCCM  Infectious disease  Procedures:   None  Antimicrobials:   Ancef 4/3---   Subjective: Patient seen and examined.  On my examination, he was tachypneic and was having temperature of 101.3.  He has intermittent sputum production with tinge of blood.  Wants to get out of the bed and go around.  Currently on 2 L oxygen.  Feels like he cannot take deep breaths.  Objective: Vitals:   05/26/20 0300 05/26/20 0807 05/26/20 0955 05/26/20 1129  BP: 112/82     Pulse: (!) 105   (!) 101  Resp: 20     Temp: 98.9 F (37.2 C) 100.1 F (37.8 C) (!) 101.3 F (38.5 C) 100  F (37.8 C)  TempSrc: Axillary Oral Oral Oral  SpO2: 98%   95%  Weight:      Height:        Intake/Output Summary (Last 24 hours) at 05/26/2020 1148 Last data filed at 05/26/2020 0900 Gross per 24 hour  Intake 1943 ml  Output 850 ml  Net 1093 ml   Filed Weights   05/24/20 1410  Weight: 68 kg    Examination:  General exam: Appears in mild respiratory distress, anxious and febrile. Respiratory system: Has occasional coarse crackles all over the lung fields.   No wheezing.  Adequate air entry. Cardiovascular system: S1 & S2 heard, tachycardic. No pedal edema. Gastrointestinal system: Abdomen is nondistended, soft and nontender. No organomegaly or masses felt. Normal bowel sounds heard. Central nervous system: Alert and oriented. No focal neurological deficits. Extremities: Symmetric 5 x 5 power. Skin: No rashes, lesions or ulcers Mild tenderness along the left elbow, no collections or fluctuation. Psychiatry: Judgement and insight appear normal. Mood & affect anxious..     Data Reviewed: I have personally reviewed following labs and imaging studies  CBC: Recent Labs  Lab 05/24/20 1417 05/24/20 1512 05/25/20 0218 05/26/20 0304  WBC 6.4  --  3.2* 5.3  NEUTROABS 5.8  --   --   --   HGB 16.8 16.7 14.5 13.7  HCT 48.0 49.0 42.2 39.1  MCV 95.2  --  95.9 91.6  PLT 207  --  168 180   Basic Metabolic Panel: Recent Labs  Lab 05/24/20 1417 05/24/20 1512 05/25/20 0218 05/26/20 0304  NA 135 131* 134* 133*  K 3.9 4.1 4.0 3.7  CL 97*  --  102 100  CO2 25  --  19* 23  GLUCOSE 127*  --  124* 107*  BUN 25*  --  34* 42*  CREATININE 2.68*  --  2.79* 2.53*  CALCIUM 7.8*  --  7.1* 7.5*  MG  --   --  1.7 2.1  PHOS  --   --   --  3.5   GFR: Estimated Creatinine Clearance: 33.6 mL/min (A) (by C-G formula based on SCr of 2.53 mg/dL (H)). Liver Function Tests: Recent Labs  Lab 05/24/20 1417  AST 35  ALT 24  ALKPHOS 56  BILITOT 1.2  PROT 6.0*  ALBUMIN 2.6*   No results for input(s): LIPASE, AMYLASE in the last 168 hours. No results for input(s): AMMONIA in the last 168 hours. Coagulation Profile: Recent Labs  Lab 05/24/20 1417  INR 1.1   Cardiac Enzymes: No results for input(s): CKTOTAL, CKMB, CKMBINDEX, TROPONINI in the last 168 hours. BNP (last 3 results) No results for input(s): PROBNP in the last 8760 hours. HbA1C: No results for input(s): HGBA1C in the last 72 hours. CBG: No results for input(s): GLUCAP in the last 168  hours. Lipid Profile: No results for input(s): CHOL, HDL, LDLCALC, TRIG, CHOLHDL, LDLDIRECT in the last 72 hours. Thyroid Function Tests: No results for input(s): TSH, T4TOTAL, FREET4, T3FREE, THYROIDAB in the last 72 hours. Anemia Panel: No results for input(s): VITAMINB12, FOLATE, FERRITIN, TIBC, IRON, RETICCTPCT in the last 72 hours. Sepsis Labs: Recent Labs  Lab 05/24/20 1417 05/24/20 1630 05/24/20 2146  LATICACIDVEN 3.2* 3.7* 3.1*    Recent Results (from the past 240 hour(s))  Blood Culture (routine x 2)     Status: Abnormal (Preliminary result)   Collection Time: 05/24/20  3:08 PM   Specimen: Left Antecubital; Blood  Result Value Ref Range Status   Specimen  Description LEFT ANTECUBITAL  Final   Special Requests   Final    BOTTLES DRAWN AEROBIC AND ANAEROBIC Blood Culture results may not be optimal due to an inadequate volume of blood received in culture bottles   Culture  Setup Time   Final    GRAM POSITIVE COCCI IN CLUSTERS IN BOTH AEROBIC AND ANAEROBIC BOTTLES CRITICAL RESULT CALLED TO, READ BACK BY AND VERIFIED WITH: PHARMD A WOLF [id number] AT 754 AM BY CM    Culture (A)  Final    STAPHYLOCOCCUS AUREUS SUSCEPTIBILITIES TO FOLLOW Performed at [facility], [address] [place], [place] [address]    Report Status PENDING  Incomplete  Blood Culture ID Panel (Reflexed)     Status: Abnormal   Collection Time: 05/24/20  3:08 PM  Result Value Ref Range Status   Enterococcus faecalis NOT DETECTED NOT DETECTED Final   Enterococcus Faecium NOT DETECTED NOT DETECTED Final   Listeria monocytogenes NOT DETECTED NOT DETECTED Final   Staphylococcus species DETECTED (A) NOT DETECTED Final    Comment: CRITICAL RESULT CALLED TO, READ BACK BY AND VERIFIED WITH: PHARMD A WOLF [id number] AT 800 BY CM    Staphylococcus aureus (BCID) DETECTED (A) NOT DETECTED Final    Comment: CRITICAL RESULT CALLED TO, READ BACK BY AND VERIFIED WITH: PHARMD A WOLF [id number] AT 800 BY CM     Staphylococcus epidermidis NOT DETECTED NOT DETECTED Final   Staphylococcus lugdunensis NOT DETECTED NOT DETECTED Final   Streptococcus species NOT DETECTED NOT DETECTED Final   Streptococcus agalactiae NOT DETECTED NOT DETECTED Final   Streptococcus pneumoniae NOT DETECTED NOT DETECTED Final   Streptococcus pyogenes NOT DETECTED NOT DETECTED Final   A.calcoaceticus-baumannii NOT DETECTED NOT DETECTED Final   Bacteroides fragilis NOT DETECTED NOT DETECTED Final   Enterobacterales NOT DETECTED NOT DETECTED Final   Enterobacter cloacae complex NOT DETECTED NOT DETECTED Final   Escherichia coli NOT DETECTED NOT DETECTED Final   Klebsiella aerogenes NOT DETECTED NOT DETECTED Final   Klebsiella oxytoca NOT DETECTED NOT DETECTED Final   Klebsiella pneumoniae NOT DETECTED NOT DETECTED Final   Proteus species NOT DETECTED NOT DETECTED Final   Salmonella species NOT DETECTED NOT DETECTED Final   Serratia marcescens NOT DETECTED NOT DETECTED Final   Haemophilus influenzae NOT DETECTED NOT DETECTED Final   Neisseria meningitidis NOT DETECTED NOT DETECTED Final   Pseudomonas aeruginosa NOT DETECTED NOT DETECTED Final   Stenotrophomonas maltophilia NOT DETECTED NOT DETECTED Final   Candida albicans NOT DETECTED NOT DETECTED Final   Candida auris NOT DETECTED NOT DETECTED Final   Candida glabrata NOT DETECTED NOT DETECTED Final   Candida krusei NOT DETECTED NOT DETECTED Final   Candida parapsilosis NOT DETECTED NOT DETECTED Final   Candida tropicalis NOT DETECTED NOT DETECTED Final   Cryptococcus neoformans/gattii NOT DETECTED NOT DETECTED Final   Meth resistant mecA/C and MREJ NOT DETECTED NOT DETECTED Final    Comment: Performed at [facility], [address], [place], [place] [address]  Resp Panel by RT-PCR (Flu A&B, Covid) Nasopharyngeal Swab     Status: None   Collection Time: 05/24/20  3:14 PM   Specimen: Nasopharyngeal Swab; Nasopharyngeal(NP) swabs in vial transport medium  Result  Value Ref Range Status   SARS Coronavirus 2 by RT PCR NEGATIVE NEGATIVE Final    Comment: (NOTE) SARS-CoV-2 target nucleic acids are NOT DETECTED.  The SARS-CoV-2 RNA is generally detectable in upper respiratory specimens during the acute phase of infection. The lowest concentration  of SARS-CoV-2 viral copies this assay can detect is 138 copies/mL. A negative result does not preclude SARS-Cov-2 infection and should not be used as the sole basis for treatment or other patient management decisions. A negative result may occur with  improper specimen collection/handling, submission of specimen other than nasopharyngeal swab, presence of viral mutation(s) within the areas targeted by this assay, and inadequate number of viral copies(<138 copies/mL). A negative result must be combined with clinical observations, patient history, and epidemiological information. The expected result is Negative.  Fact Sheet for Patients:  BloggerCourse.com  Fact Sheet for Healthcare Providers:  SeriousBroker.it  This test is no t yet approved or cleared by the [place] FDA and  has been authorized for detection and/or diagnosis of SARS-CoV-2 by FDA under an Emergency Use Authorization (EUA). This EUA will remain  in effect (meaning this test can be used) for the duration of the COVID-19 declaration under Section 564(b)(1) of the Act, 21 U.S.C.section 360bbb-3(b)(1), unless the authorization is terminated  or revoked sooner.       Influenza A by PCR NEGATIVE NEGATIVE Final   Influenza B by PCR NEGATIVE NEGATIVE Final    Comment: (NOTE) The Xpert Xpress SARS-CoV-2/FLU/RSV plus assay is intended as an aid in the diagnosis of influenza from Nasopharyngeal swab specimens and should not be used as a sole basis for treatment. Nasal washings and aspirates are unacceptable for Xpert Xpress SARS-CoV-2/FLU/RSV testing.  Fact Sheet for  Patients: BloggerCourse.com  Fact Sheet for Healthcare Providers: SeriousBroker.it  This test is not yet approved or cleared by the [place] FDA and has been authorized for detection and/or diagnosis of SARS-CoV-2 by FDA under an Emergency Use Authorization (EUA). This EUA will remain in effect (meaning this test can be used) for the duration of the COVID-19 declaration under Section 564(b)(1) of the Act, 21 U.S.C. section 360bbb-3(b)(1), unless the authorization is terminated or revoked.  Performed at [facility], [address] [place], [place] [address]   Blood Culture (routine x 2)     Status: Abnormal (Preliminary result)   Collection Time: 05/24/20  9:45 PM   Specimen: BLOOD LEFT HAND  Result Value Ref Range Status   Specimen Description BLOOD LEFT HAND  Final   Special Requests   Final    BOTTLES DRAWN AEROBIC AND ANAEROBIC Blood Culture results may not be optimal due to an inadequate volume of blood received in culture bottles   Culture  Setup Time   Final    GRAM POSITIVE COCCI IN CLUSTERS IN BOTH AEROBIC AND ANAEROBIC BOTTLES CRITICAL VALUE NOTED.  VALUE IS CONSISTENT WITH PREVIOUSLY REPORTED AND CALLED VALUE. Performed at [facility], [address], [place], [place] [address]    Culture STAPHYLOCOCCUS AUREUS (A)  Final   Report Status PENDING  Incomplete  MRSA PCR Screening     Status: None   Collection Time: 05/24/20 11:46 PM   Specimen: Nasopharyngeal  Result Value Ref Range Status   MRSA by PCR NEGATIVE NEGATIVE Final    Comment:        The GeneXpert MRSA Assay (FDA approved for NASAL specimens only), is one component of a comprehensive MRSA colonization surveillance program. It is not intended to diagnose MRSA infection nor to guide or monitor treatment for MRSA infections. Performed at [facility], [address], [place], [place] [address]   Urine culture     Status: None    Collection Time: 05/24/20 11:59 PM   Specimen:  In/Out Cath Urine  Result Value Ref Range Status   Specimen Description IN/OUT CATH URINE  Final   Special Requests NONE  Final   Culture   Final    NO GROWTH Performed at [facility], [address] [place], [place] [address]    Report Status 05/26/2020 FINAL  Final  Culture, blood (routine x 2)     Status: None (Preliminary result)   Collection Time: 05/25/20 10:19 PM   Specimen: BLOOD LEFT HAND  Result Value Ref Range Status   Specimen Description BLOOD LEFT HAND  Final   Special Requests   Final    BOTTLES DRAWN AEROBIC AND ANAEROBIC Blood Culture adequate volume   Culture   Final    NO GROWTH < 12 HOURS Performed at [facility], [address] [place], [place] [address]    Report Status PENDING  Incomplete  Culture, blood (routine x 2)     Status: None (Preliminary result)   Collection Time: 05/25/20 10:32 PM   Specimen: BLOOD RIGHT HAND  Result Value Ref Range Status   Specimen Description BLOOD RIGHT HAND  Final   Special Requests AEROBIC BOTTLE ONLY Blood Culture adequate volume  Final   Culture   Final    NO GROWTH < 12 HOURS Performed at [facility], [address] [place], [place] [address]    Report Status PENDING  Incomplete         Radiology Studies: CT Chest Wo Contrast  Result Date: 05/24/2020 CLINICAL DATA:  Chest pain and shortness of breath. EXAM: CT CHEST WITHOUT CONTRAST TECHNIQUE: Multidetector CT imaging of the chest was performed following the standard protocol without IV contrast. COMPARISON:  None. FINDINGS: Cardiovascular: No significant vascular findings. Normal heart size. No pericardial effusion. Mediastinum/Nodes: No enlarged mediastinal or axillary lymph nodes. Thyroid gland, trachea, and esophagus demonstrate no significant findings. Lungs/Pleura: Innumerable ill-defined bilateral noncalcified nodular appearing areas are seen throughout both lungs. Marked severity, patchy  anteromedial left upper lobe and bilateral lower lobe infiltrates are present. A very small left pleural effusion is seen. No pneumothorax is identified. Upper Abdomen: No acute abnormality. Musculoskeletal: No chest wall mass or suspicious bone lesions identified. IMPRESSION: 1. Marked severity left upper lobe and bilateral lower lobe infiltrates with innumerable bilateral nodular appearing areas, as described above. While this may be, in part, infectious in etiology, sequelae associated with pulmonary metastasis cannot be excluded. 2. Very small left pleural effusion. Electronically Signed   By: Aram Candelahaddeus  Houston M.D.   On: 05/24/2020 18:19   DG CHEST PORT 1 VIEW  Result Date: 05/26/2020 CLINICAL DATA:  Shortness of breath EXAM: PORTABLE CHEST 1 VIEW COMPARISON:  May 24, 2020 chest radiograph and chest CT FINDINGS: Multiple nodular opacities are seen throughout the lungs, appreciable on recent CT but not appreciable on chest radiograph from 2 days prior. No areas of cavitation evident. Airspace consolidation is noted in portions of each lung base with equivocal pleural effusions bilaterally. Heart size and pulmonary vascular normal. No adenopathy appreciable by radiography. No bone lesions. IMPRESSION: Airspace opacity in the lung bases consistent with pneumonia. Small pleural effusions bilaterally. Nodular opacities throughout the lungs with increased conspicuity compared to chest radiograph 2 days prior. Question multiple septic emboli, although no cavitation evident. Underlying neoplasm is also possible. Heart size normal.  No adenopathy appreciable by radiography. Electronically Signed   By: Bretta BangWilliam  Woodruff III M.D.   On: 05/26/2020 09:49   DG Chest [facility] 1 View  Result Date: 05/24/2020 CLINICAL DATA:  Sepsis.  LEFT arm plain EXAM: PORTABLE CHEST 1 VIEW COMPARISON:  06/12/2007 FINDINGS: Normal cardiac silhouette. There is bibasilar airspace disease in perihilar distribution. Nodule in the LEFT upper  lobe. Small effusions. No pneumothorax. IMPRESSION: Findings most consistent with multilobar pneumonia. LEFT upper lobe nodularity. Followup PA and lateral chest X-ray is recommended in 3-4 weeks following trial of antibiotic therapy to ensure resolution and exclude underlying malignancy. Electronically Signed   By: Genevive Bi M.D.   On: 05/24/2020 15:53   ECHOCARDIOGRAM COMPLETE  Result Date: 05/25/2020    ECHOCARDIOGRAM REPORT   Patient Name:   COLM LYFORD Date of Exam: 05/25/2020 Medical Rec #:  [id number]      Height:       75.0 in Accession #:    [id number]     Weight:       150.0 lb Date of Birth:  1971-02-07      BSA:          1.942 m Patient Age:    49 years       BP:           117/82 mmHg Patient Gender: M              HR:           99 bpm. Exam Location:  Inpatient Procedure: 2D Echo, Cardiac Doppler, Color Doppler and Intracardiac            Opacification Agent Indications:    Endocarditis  History:        Patient has no prior history of Echocardiogram examinations.  Sonographer:    Neomia Dear RDCS Referring Phys: [id number] RAVI AGARWALA IMPRESSIONS  1. Abnormal septal motion . Left ventricular ejection fraction, by estimation, is 45 to 50%. The left ventricle has mildly decreased function. The left ventricle has no regional wall motion abnormalities. Left ventricular diastolic parameters were normal.  2. Right ventricular systolic function is normal. The right ventricular size is normal.  3. The mitral valve is normal in structure. No evidence of mitral valve regurgitation. No evidence of mitral stenosis.  4. The aortic valve is tricuspid. Aortic valve regurgitation is not visualized. No aortic stenosis is present.  5. The inferior vena cava is normal in size with greater than 50% respiratory variability, suggesting right atrial pressure of 3 mmHg. FINDINGS  Left Ventricle: Abnormal septal motion. Left ventricular ejection fraction, by estimation, is 45 to 50%. The left ventricle has mildly  decreased function. The left ventricle has no regional wall motion abnormalities. Definity contrast agent was given IV  to delineate the left ventricular endocardial borders. The left ventricular internal cavity size was normal in size. There is no left ventricular hypertrophy. Left ventricular diastolic parameters were normal. Right Ventricle: The right ventricular size is normal. No increase in right ventricular wall thickness. Right ventricular systolic function is normal. Left Atrium: Left atrial size was normal in size. Right Atrium: Right atrial size was normal in size. Pericardium: There is no evidence of pericardial effusion. Mitral Valve: The mitral valve is normal in structure. There is mild thickening of the mitral valve leaflet(s). There is mild calcification of the mitral valve leaflet(s). No evidence of mitral valve regurgitation. No evidence of mitral valve stenosis. Tricuspid Valve: The tricuspid valve is normal in structure. Tricuspid valve regurgitation is not demonstrated. No evidence of tricuspid stenosis. Aortic Valve: The aortic valve is tricuspid. Aortic valve regurgitation is not visualized. No aortic stenosis is present. Aortic  valve mean gradient measures 4.5 mmHg. Aortic valve peak gradient measures 7.3 mmHg. Aortic valve area, by VTI measures 2.67 cm. Pulmonic Valve: The pulmonic valve was normal in structure. Pulmonic valve regurgitation is not visualized. No evidence of pulmonic stenosis. Aorta: The aortic root is normal in size and structure. Venous: The inferior vena cava is normal in size with greater than 50% respiratory variability, suggesting right atrial pressure of 3 mmHg. IAS/Shunts: No atrial level shunt detected by color flow Doppler.  LEFT VENTRICLE PLAX 2D LVIDd:         5.10 cm      Diastology LVIDs:         4.20 cm      LV e' medial:    7.72 cm/s LV PW:         0.70 cm      LV E/e' medial:  10.0 LV IVS:        0.60 cm      LV e' lateral:   16.20 cm/s LVOT diam:     2.30  cm      LV E/e' lateral: 4.8 LV SV:         55 LV SV Index:   28 LVOT Area:     4.15 cm  LV Volumes (MOD) LV vol d, MOD A2C: 61.7 ml LV vol d, MOD A4C: 103.0 ml LV vol s, MOD A4C: 69.0 ml LV SV MOD A4C:     103.0 ml RIGHT VENTRICLE RV S prime:     13.40 cm/s  PULMONARY VEINS TAPSE (M-mode): 2.4 cm      A Reversal Duration: 90.00 msec                             A Reversal Velocity: 29.80 cm/s                             Diastolic Velocity:  49.40 cm/s                             S/D Velocity:        0.80                             Systolic Velocity:   40.60 cm/s LEFT ATRIUM             Index       RIGHT ATRIUM           Index LA diam:        3.30 cm 1.70 cm/m  RA Area:     14.10 cm LA Vol (A2C):   45.8 ml 23.59 ml/m RA Volume:   37.50 ml  19.31 ml/m LA Vol (A4C):   57.1 ml 29.40 ml/m LA Biplane Vol: 53.0 ml 27.29 ml/m  AORTIC VALVE                    PULMONIC VALVE AV Area (Vmax):    2.60 cm     PV Vmax:       0.94 m/s AV Area (Vmean):   2.33 cm     PV Vmean:      78.000 cm/s AV Area (VTI):     2.67 cm     PV VTI:        0.157 m AV Vmax:  135.00 cm/s  PV Peak grad:  3.5 mmHg AV Vmean:          101.100 cm/s PV Mean grad:  3.0 mmHg AV VTI:            0.207 m AV Peak Grad:      7.3 mmHg AV Mean Grad:      4.5 mmHg LVOT Vmax:         84.50 cm/s LVOT Vmean:        56.700 cm/s LVOT VTI:          0.133 m LVOT/AV VTI ratio: 0.64  AORTA Ao Root diam: 3.20 cm Ao Asc diam:  2.80 cm MITRAL VALVE MV Area (PHT): 6.83 cm    SHUNTS MV Decel Time: 111 msec    Systemic VTI:  0.13 m MV E velocity: 77.10 cm/s  Systemic Diam: 2.30 cm MV A velocity: 81.20 cm/s MV E/A ratio:  0.95 Charlton Haws MD Electronically signed by Charlton Haws MD Signature Date/Time: 05/25/2020/9:09:26 AM    Final    VAS [place] UPPER EXTREMITY VENOUS DUPLEX  Result Date: 05/25/2020 UPPER VENOUS STUDY  Indications: Pain, swelling, redness (linear pattern) Risk Factors: Donates plasma regularly. Comparison Study: No previous exams Performing  Technologist: Ernestene Mention  Examination Guidelines: A complete evaluation includes B-mode imaging, spectral Doppler, color Doppler, and power Doppler as needed of all accessible portions of each vessel. Bilateral testing is considered an integral part of a complete examination. Limited examinations for reoccurring indications may be performed as noted.  Right Findings: +----------+------------+---------+-----------+----------+-------+ RIGHT     CompressiblePhasicitySpontaneousPropertiesSummary +----------+------------+---------+-----------+----------+-------+ Subclavian    Full       Yes       Yes                      +----------+------------+---------+-----------+----------+-------+  Left Findings: +----------+------------+---------+-----------+----------+-------+ LEFT      CompressiblePhasicitySpontaneousPropertiesSummary +----------+------------+---------+-----------+----------+-------+ IJV           Full       Yes       Yes                      +----------+------------+---------+-----------+----------+-------+ Subclavian    Full       Yes       Yes                      +----------+------------+---------+-----------+----------+-------+ Axillary      Full       Yes       Yes                      +----------+------------+---------+-----------+----------+-------+ Brachial      Full       Yes       Yes                      +----------+------------+---------+-----------+----------+-------+ Radial        Full                                          +----------+------------+---------+-----------+----------+-------+ Ulnar         Full                                          +----------+------------+---------+-----------+----------+-------+  Cephalic      None       No        No                Acute  +----------+------------+---------+-----------+----------+-------+ Basilic       Full       Yes       Yes                       +----------+------------+---------+-----------+----------+-------+  Summary:  Right: No evidence of thrombosis in the subclavian.  Left: No evidence of deep vein thrombosis in the upper extremity. Findings consistent with acute superficial vein thrombosis involving the left cephalic vein.  *See table(s) above for measurements and observations.  Diagnosing physician: Fabienne Bruns MD Electronically signed by Fabienne Bruns MD on 05/25/2020 at 3:00:55 PM.    Final         Scheduled Meds: . fentaNYL (SUBLIMAZE) injection  50 mcg Intravenous Once  . heparin  5,000 Units Subcutaneous Q8H   Continuous Infusions: .  ceFAZolin (ANCEF) IV Stopped (05/26/20 0604)  . lactated ringers 75 mL/hr at 05/26/20 0834     LOS: 2 days    Time spent: 35 minutes    Dorcas Carrow, MD [facility] Pager [phone]

## 2020-05-26 NOTE — H&P (Signed)
See note by Dr Denese Killings , labelled as consult note on 05/24/2020 819 PM

## 2020-05-26 NOTE — Progress Notes (Signed)
PROGRESS NOTE    Edward Mueller  MRN:[id number] DOB: 02/05/1971 DOA: 05/24/2020 PCP: Pcp, No    Brief Narrative:  49-year-old gentleman with no reported medical problems who donates plasma 2 times a week, donated plasma on 4/1 and after about 24 hours he started not feeling well, generalized malaise, nausea vomiting and shortness of breath and pleuritic chest pain.  Cough with blood-tinged sputum.  Came to the emergency room on 4/3, was found to be febrile and tachypneic with lactic acidosis.  Resuscitated and admitted to ICU then transferred to progressive care unit with stable blood pressures. In the emergency room, tachypneic but on room air.  Temperature one 1.8.  Lactic acid 3.1.  Blood pressures adequate.  CT scan with extensive pneumonia.  Left arm with minimal cellulitis.  Ultimately blood cultures growing MSSA bacteremia.   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  Septic emboli and multilobar pneumonia: Primary source of infection unknown, denies any injectable drug use. He does donate plasma twice a day, symptoms started after needle prick at plasma donation.  Left arm cellulitis is very mild. Initially on broad-spectrum antibiotics, currently remains on Ancef.  Followed by infectious disease.  Repeat blood cultures negative so far. Start mobilizing.  Start doing incentive spirometry and deep breathing exercises.  Start flutter valve therapies. Sputum cultures, Legionella and pneumococcal pending.  Likely MSSA. 2D echocardiogram did not show any evidence of vegetations, discussed with cardiology about doing TEE possibly tomorrow.  Will need prolonged antibiotics.  Left hand cellulitis: He had some redness on the left elbow on presentation, clearing up.  Renal failure: Unknown whether acute or chronic.  Mild improvement in  renal functions.  Urine output is adequate.  850 mL last 24 hours.   Urinalysis was clean.  Urine cultures with no growth.  Will check renal ultrasound to evaluate for any evidence of chronic kidney disease.   DVT prophylaxis: heparin injection 5,000 Units Start: 05/24/20 2330 SCDs Start: 05/24/20 2315   Code Status: Full code. Family Communication: None at the bedside. Called both sisters , they were unable to pick up the phone. Disposition Plan: Status is: Inpatient  Remains inpatient appropriate because:IV treatments appropriate due to intensity of illness or inability to take PO and Inpatient level of care appropriate due to severity of illness   Dispo: The patient is from: Home              Anticipated d/c is to: Home              Patient currently is not medically stable to d/c.   Difficult to place patient No         Consultants:   PCCM  Infectious disease  Procedures:   None  Antimicrobials:   Ancef 4/3---   Subjective: Patient seen and examined.  On my examination, he was tachypneic and was having temperature of 101.3.  He has intermittent sputum production with tinge of blood.  Wants to get out of the bed and go around.  Currently on 2 L oxygen.  Feels like he cannot take deep breaths.  Objective: Vitals:   05/26/20 0300 05/26/20 0807 05/26/20 0955 05/26/20 1129  BP: 112/82     Pulse: (!) 105   (!) 101  Resp: 20     Temp: 98.9 F (37.2 C) 100.1 F (37.8 C) (!) 101.3 F (38.5 C) 100   F (37.8 C)  TempSrc: Axillary Oral Oral Oral  SpO2: 98%   95%  Weight:      Height:        Intake/Output Summary (Last 24 hours) at 05/26/2020 1148 Last data filed at 05/26/2020 0900 Gross per 24 hour  Intake 1943 ml  Output 850 ml  Net 1093 ml   Filed Weights   05/24/20 1410  Weight: 68 kg    Examination:  General exam: Appears in mild respiratory distress, anxious and febrile. Respiratory system: Has occasional coarse crackles all over the lung fields.   No wheezing.  Adequate air entry. Cardiovascular system: S1 & S2 heard, tachycardic. No pedal edema. Gastrointestinal system: Abdomen is nondistended, soft and nontender. No organomegaly or masses felt. Normal bowel sounds heard. Central nervous system: Alert and oriented. No focal neurological deficits. Extremities: Symmetric 5 x 5 power. Skin: No rashes, lesions or ulcers Mild tenderness along the left elbow, no collections or fluctuation. Psychiatry: Judgement and insight appear normal. Mood & affect anxious..     Data Reviewed: I have personally reviewed following labs and imaging studies  CBC: Recent Labs  Lab 05/24/20 1417 05/24/20 1512 05/25/20 0218 05/26/20 0304  WBC 6.4  --  3.2* 5.3  NEUTROABS 5.8  --   --   --   HGB 16.8 16.7 14.5 13.7  HCT 48.0 49.0 42.2 39.1  MCV 95.2  --  95.9 91.6  PLT 207  --  168 180   Basic Metabolic Panel: Recent Labs  Lab 05/24/20 1417 05/24/20 1512 05/25/20 0218 05/26/20 0304  NA 135 131* 134* 133*  K 3.9 4.1 4.0 3.7  CL 97*  --  102 100  CO2 25  --  19* 23  GLUCOSE 127*  --  124* 107*  BUN 25*  --  34* 42*  CREATININE 2.68*  --  2.79* 2.53*  CALCIUM 7.8*  --  7.1* 7.5*  MG  --   --  1.7 2.1  PHOS  --   --   --  3.5   GFR: Estimated Creatinine Clearance: 33.6 mL/min (A) (by C-G formula based on SCr of 2.53 mg/dL (H)). Liver Function Tests: Recent Labs  Lab 05/24/20 1417  AST 35  ALT 24  ALKPHOS 56  BILITOT 1.2  PROT 6.0*  ALBUMIN 2.6*   No results for input(s): LIPASE, AMYLASE in the last 168 hours. No results for input(s): AMMONIA in the last 168 hours. Coagulation Profile: Recent Labs  Lab 05/24/20 1417  INR 1.1   Cardiac Enzymes: No results for input(s): CKTOTAL, CKMB, CKMBINDEX, TROPONINI in the last 168 hours. BNP (last 3 results) No results for input(s): PROBNP in the last 8760 hours. HbA1C: No results for input(s): HGBA1C in the last 72 hours. CBG: No results for input(s): GLUCAP in the last 168  hours. Lipid Profile: No results for input(s): CHOL, HDL, LDLCALC, TRIG, CHOLHDL, LDLDIRECT in the last 72 hours. Thyroid Function Tests: No results for input(s): TSH, T4TOTAL, FREET4, T3FREE, THYROIDAB in the last 72 hours. Anemia Panel: No results for input(s): VITAMINB12, FOLATE, FERRITIN, TIBC, IRON, RETICCTPCT in the last 72 hours. Sepsis Labs: Recent Labs  Lab 05/24/20 1417 05/24/20 1630 05/24/20 2146  LATICACIDVEN 3.2* 3.7* 3.1*    Recent Results (from the past 240 hour(s))  Blood Culture (routine x 2)     Status: Abnormal (Preliminary result)   Collection Time: 05/24/20  3:08 PM   Specimen: Left Antecubital; Blood  Result Value Ref Range Status   Specimen   Description LEFT ANTECUBITAL  Final   Special Requests   Final    BOTTLES DRAWN AEROBIC AND ANAEROBIC Blood Culture results may not be optimal due to an inadequate volume of blood received in culture bottles   Culture  Setup Time   Final    GRAM POSITIVE COCCI IN CLUSTERS IN BOTH AEROBIC AND ANAEROBIC BOTTLES CRITICAL RESULT CALLED TO, READ BACK BY AND VERIFIED WITH: PHARMD A WOLF [id number] AT 754 AM BY CM    Culture (A)  Final    STAPHYLOCOCCUS AUREUS SUSCEPTIBILITIES TO FOLLOW Performed at [facility], [address] [place], [place] [address]    Report Status PENDING  Incomplete  Blood Culture ID Panel (Reflexed)     Status: Abnormal   Collection Time: 05/24/20  3:08 PM  Result Value Ref Range Status   Enterococcus faecalis NOT DETECTED NOT DETECTED Final   Enterococcus Faecium NOT DETECTED NOT DETECTED Final   Listeria monocytogenes NOT DETECTED NOT DETECTED Final   Staphylococcus species DETECTED (A) NOT DETECTED Final    Comment: CRITICAL RESULT CALLED TO, READ BACK BY AND VERIFIED WITH: PHARMD A WOLF [id number] AT 800 BY CM    Staphylococcus aureus (BCID) DETECTED (A) NOT DETECTED Final    Comment: CRITICAL RESULT CALLED TO, READ BACK BY AND VERIFIED WITH: PHARMD A WOLF [id number] AT 800 BY CM     Staphylococcus epidermidis NOT DETECTED NOT DETECTED Final   Staphylococcus lugdunensis NOT DETECTED NOT DETECTED Final   Streptococcus species NOT DETECTED NOT DETECTED Final   Streptococcus agalactiae NOT DETECTED NOT DETECTED Final   Streptococcus pneumoniae NOT DETECTED NOT DETECTED Final   Streptococcus pyogenes NOT DETECTED NOT DETECTED Final   A.calcoaceticus-baumannii NOT DETECTED NOT DETECTED Final   Bacteroides fragilis NOT DETECTED NOT DETECTED Final   Enterobacterales NOT DETECTED NOT DETECTED Final   Enterobacter cloacae complex NOT DETECTED NOT DETECTED Final   Escherichia coli NOT DETECTED NOT DETECTED Final   Klebsiella aerogenes NOT DETECTED NOT DETECTED Final   Klebsiella oxytoca NOT DETECTED NOT DETECTED Final   Klebsiella pneumoniae NOT DETECTED NOT DETECTED Final   Proteus species NOT DETECTED NOT DETECTED Final   Salmonella species NOT DETECTED NOT DETECTED Final   Serratia marcescens NOT DETECTED NOT DETECTED Final   Haemophilus influenzae NOT DETECTED NOT DETECTED Final   Neisseria meningitidis NOT DETECTED NOT DETECTED Final   Pseudomonas aeruginosa NOT DETECTED NOT DETECTED Final   Stenotrophomonas maltophilia NOT DETECTED NOT DETECTED Final   Candida albicans NOT DETECTED NOT DETECTED Final   Candida auris NOT DETECTED NOT DETECTED Final   Candida glabrata NOT DETECTED NOT DETECTED Final   Candida krusei NOT DETECTED NOT DETECTED Final   Candida parapsilosis NOT DETECTED NOT DETECTED Final   Candida tropicalis NOT DETECTED NOT DETECTED Final   Cryptococcus neoformans/gattii NOT DETECTED NOT DETECTED Final   Meth resistant mecA/C and MREJ NOT DETECTED NOT DETECTED Final    Comment: Performed at [facility], [address] [place], [place] [address]  Resp Panel by RT-PCR (Flu A&B, Covid) Nasopharyngeal Swab     Status: None   Collection Time: 05/24/20  3:14 PM   Specimen: Nasopharyngeal Swab; Nasopharyngeal(NP) swabs in vial transport medium  Result  Value Ref Range Status   SARS Coronavirus 2 by RT PCR NEGATIVE NEGATIVE Final    Comment: (NOTE) SARS-CoV-2 target nucleic acids are NOT DETECTED.  The SARS-CoV-2 RNA is generally detectable in upper respiratory specimens during the acute phase of infection. The lowest concentration   of SARS-CoV-2 viral copies this assay can detect is 138 copies/mL. A negative result does not preclude SARS-Cov-2 infection and should not be used as the sole basis for treatment or other patient management decisions. A negative result may occur with  improper specimen collection/handling, submission of specimen other than nasopharyngeal swab, presence of viral mutation(s) within the areas targeted by this assay, and inadequate number of viral copies(<138 copies/mL). A negative result must be combined with clinical observations, patient history, and epidemiological information. The expected result is Negative.  Fact Sheet for Patients:  https://www.fda.gov/media/152166/download  Fact Sheet for Healthcare Providers:  https://www.fda.gov/media/152162/download  This test is no t yet approved or cleared by the [place] FDA and  has been authorized for detection and/or diagnosis of SARS-CoV-2 by FDA under an Emergency Use Authorization (EUA). This EUA will remain  in effect (meaning this test can be used) for the duration of the COVID-19 declaration under Section 564(b)(1) of the Act, 21 U.S.C.section 360bbb-3(b)(1), unless the authorization is terminated  or revoked sooner.       Influenza A by PCR NEGATIVE NEGATIVE Final   Influenza B by PCR NEGATIVE NEGATIVE Final    Comment: (NOTE) The Xpert Xpress SARS-CoV-2/FLU/RSV plus assay is intended as an aid in the diagnosis of influenza from Nasopharyngeal swab specimens and should not be used as a sole basis for treatment. Nasal washings and aspirates are unacceptable for Xpert Xpress SARS-CoV-2/FLU/RSV testing.  Fact Sheet for  Patients: https://www.fda.gov/media/152166/download  Fact Sheet for Healthcare Providers: https://www.fda.gov/media/152162/download  This test is not yet approved or cleared by the [place] FDA and has been authorized for detection and/or diagnosis of SARS-CoV-2 by FDA under an Emergency Use Authorization (EUA). This EUA will remain in effect (meaning this test can be used) for the duration of the COVID-19 declaration under Section 564(b)(1) of the Act, 21 U.S.C. section 360bbb-3(b)(1), unless the authorization is terminated or revoked.  Performed at [facility], [address] [place], [place] [address]   Blood Culture (routine x 2)     Status: Abnormal (Preliminary result)   Collection Time: 05/24/20  9:45 PM   Specimen: BLOOD LEFT HAND  Result Value Ref Range Status   Specimen Description BLOOD LEFT HAND  Final   Special Requests   Final    BOTTLES DRAWN AEROBIC AND ANAEROBIC Blood Culture results may not be optimal due to an inadequate volume of blood received in culture bottles   Culture  Setup Time   Final    GRAM POSITIVE COCCI IN CLUSTERS IN BOTH AEROBIC AND ANAEROBIC BOTTLES CRITICAL VALUE NOTED.  VALUE IS CONSISTENT WITH PREVIOUSLY REPORTED AND CALLED VALUE. Performed at [facility], [address] [place], [place] [address]    Culture STAPHYLOCOCCUS AUREUS (A)  Final   Report Status PENDING  Incomplete  MRSA PCR Screening     Status: None   Collection Time: 05/24/20 11:46 PM   Specimen: Nasopharyngeal  Result Value Ref Range Status   MRSA by PCR NEGATIVE NEGATIVE Final    Comment:        The GeneXpert MRSA Assay (FDA approved for NASAL specimens only), is one component of a comprehensive MRSA colonization surveillance program. It is not intended to diagnose MRSA infection nor to guide or monitor treatment for MRSA infections. Performed at [facility], [address] [place], [place] [address]   Urine culture     Status: None    Collection Time: 05/24/20 11:59 PM   Specimen:   In/Out Cath Urine  Result Value Ref Range Status   Specimen Description IN/OUT CATH URINE  Final   Special Requests NONE  Final   Culture   Final    NO GROWTH Performed at [facility], [address] [place], [place] [address]    Report Status 05/26/2020 FINAL  Final  Culture, blood (routine x 2)     Status: None (Preliminary result)   Collection Time: 05/25/20 10:19 PM   Specimen: BLOOD LEFT HAND  Result Value Ref Range Status   Specimen Description BLOOD LEFT HAND  Final   Special Requests   Final    BOTTLES DRAWN AEROBIC AND ANAEROBIC Blood Culture adequate volume   Culture   Final    NO GROWTH < 12 HOURS Performed at [facility], [address] [place],  [address]    Report Status PENDING  Incomplete  Culture, blood (routine x 2)     Status: None (Preliminary result)   Collection Time: 05/25/20 10:32 PM   Specimen: BLOOD RIGHT HAND  Result Value Ref Range Status   Specimen Description BLOOD RIGHT HAND  Final   Special Requests AEROBIC BOTTLE ONLY Blood Culture adequate volume  Final   Culture   Final    NO GROWTH < 12 HOURS Performed at  Hospital Lab, [address] [place],  [address]    Report Status PENDING  Incomplete         Radiology Studies: CT Chest Wo Contrast  Result Date: 05/24/2020 CLINICAL DATA:  Chest pain and shortness of breath. EXAM: CT CHEST WITHOUT CONTRAST TECHNIQUE: Multidetector CT imaging of the chest was performed following the standard protocol without IV contrast. COMPARISON:  None. FINDINGS: Cardiovascular: No significant vascular findings. Normal heart size. No pericardial effusion. Mediastinum/Nodes: No enlarged mediastinal or axillary lymph nodes. Thyroid gland, trachea, and esophagus demonstrate no significant findings. Lungs/Pleura: Innumerable ill-defined bilateral noncalcified nodular appearing areas are seen throughout both lungs. Marked severity, patchy  anteromedial left upper lobe and bilateral lower lobe infiltrates are present. A very small left pleural effusion is seen. No pneumothorax is identified. Upper Abdomen: No acute abnormality. Musculoskeletal: No chest wall mass or suspicious bone lesions identified. IMPRESSION: 1. Marked severity left upper lobe and bilateral lower lobe infiltrates with innumerable bilateral nodular appearing areas, as described above. While this may be, in part, infectious in etiology, sequelae associated with pulmonary metastasis cannot be excluded. 2. Very small left pleural effusion. Electronically Signed   By: Thaddeus  Houston M.D.   On: 05/24/2020 18:19   DG CHEST PORT 1 VIEW  Result Date: 05/26/2020 CLINICAL DATA:  Shortness of breath EXAM: PORTABLE CHEST 1 VIEW COMPARISON:  May 24, 2020 chest radiograph and chest CT FINDINGS: Multiple nodular opacities are seen throughout the lungs, appreciable on recent CT but not appreciable on chest radiograph from 2 days prior. No areas of cavitation evident. Airspace consolidation is noted in portions of each lung base with equivocal pleural effusions bilaterally. Heart size and pulmonary vascular normal. No adenopathy appreciable by radiography. No bone lesions. IMPRESSION: Airspace opacity in the lung bases consistent with pneumonia. Small pleural effusions bilaterally. Nodular opacities throughout the lungs with increased conspicuity compared to chest radiograph 2 days prior. Question multiple septic emboli, although no cavitation evident. Underlying neoplasm is also possible. Heart size normal.  No adenopathy appreciable by radiography. Electronically Signed   By: William  Woodruff III M.D.   On: 05/26/2020 09:49   DG Chest Port 1 View    Result Date: 05/24/2020 CLINICAL DATA:  Sepsis.  LEFT arm plain EXAM: PORTABLE CHEST 1 VIEW COMPARISON:  06/12/2007 FINDINGS: Normal cardiac silhouette. There is bibasilar airspace disease in perihilar distribution. Nodule in the LEFT upper  lobe. Small effusions. No pneumothorax. IMPRESSION: Findings most consistent with multilobar pneumonia. LEFT upper lobe nodularity. Followup PA and lateral chest X-ray is recommended in 3-4 weeks following trial of antibiotic therapy to ensure resolution and exclude underlying malignancy. Electronically Signed   By: Stewart  Edmunds M.D.   On: 05/24/2020 15:53   ECHOCARDIOGRAM COMPLETE  Result Date: 05/25/2020    ECHOCARDIOGRAM REPORT   Patient Name:   Shamir Baudoin Date of Exam: 05/25/2020 Medical Rec #:  [id number]      Height:       75.0 in Accession #:    [id number]     Weight:       150.0 lb Date of Birth:  07/06/1970      BSA:          1.942 m Patient Age:    49 years       BP:           117/82 mmHg Patient Gender: M              HR:           99 bpm. Exam Location:  Inpatient Procedure: 2D Echo, Cardiac Doppler, Color Doppler and Intracardiac            Opacification Agent Indications:    Endocarditis  History:        Patient has no prior history of Echocardiogram examinations.  Sonographer:    TAMARA CROWN RDCS Referring Phys: [id number] RAVI AGARWALA IMPRESSIONS  1. Abnormal septal motion . Left ventricular ejection fraction, by estimation, is 45 to 50%. The left ventricle has mildly decreased function. The left ventricle has no regional wall motion abnormalities. Left ventricular diastolic parameters were normal.  2. Right ventricular systolic function is normal. The right ventricular size is normal.  3. The mitral valve is normal in structure. No evidence of mitral valve regurgitation. No evidence of mitral stenosis.  4. The aortic valve is tricuspid. Aortic valve regurgitation is not visualized. No aortic stenosis is present.  5. The inferior vena cava is normal in size with greater than 50% respiratory variability, suggesting right atrial pressure of 3 mmHg. FINDINGS  Left Ventricle: Abnormal septal motion. Left ventricular ejection fraction, by estimation, is 45 to 50%. The left ventricle has mildly  decreased function. The left ventricle has no regional wall motion abnormalities. Definity contrast agent was given IV  to delineate the left ventricular endocardial borders. The left ventricular internal cavity size was normal in size. There is no left ventricular hypertrophy. Left ventricular diastolic parameters were normal. Right Ventricle: The right ventricular size is normal. No increase in right ventricular wall thickness. Right ventricular systolic function is normal. Left Atrium: Left atrial size was normal in size. Right Atrium: Right atrial size was normal in size. Pericardium: There is no evidence of pericardial effusion. Mitral Valve: The mitral valve is normal in structure. There is mild thickening of the mitral valve leaflet(s). There is mild calcification of the mitral valve leaflet(s). No evidence of mitral valve regurgitation. No evidence of mitral valve stenosis. Tricuspid Valve: The tricuspid valve is normal in structure. Tricuspid valve regurgitation is not demonstrated. No evidence of tricuspid stenosis. Aortic Valve: The aortic valve is tricuspid. Aortic valve regurgitation is not visualized. No aortic stenosis is present. Aortic   valve mean gradient measures 4.5 mmHg. Aortic valve peak gradient measures 7.3 mmHg. Aortic valve area, by VTI measures 2.67 cm. Pulmonic Valve: The pulmonic valve was normal in structure. Pulmonic valve regurgitation is not visualized. No evidence of pulmonic stenosis. Aorta: The aortic root is normal in size and structure. Venous: The inferior vena cava is normal in size with greater than 50% respiratory variability, suggesting right atrial pressure of 3 mmHg. IAS/Shunts: No atrial level shunt detected by color flow Doppler.  LEFT VENTRICLE PLAX 2D LVIDd:         5.10 cm      Diastology LVIDs:         4.20 cm      LV e' medial:    7.72 cm/s LV PW:         0.70 cm      LV E/e' medial:  10.0 LV IVS:        0.60 cm      LV e' lateral:   16.20 cm/s LVOT diam:     2.30  cm      LV E/e' lateral: 4.8 LV SV:         55 LV SV Index:   28 LVOT Area:     4.15 cm  LV Volumes (MOD) LV vol d, MOD A2C: 61.7 ml LV vol d, MOD A4C: 103.0 ml LV vol s, MOD A4C: 69.0 ml LV SV MOD A4C:     103.0 ml RIGHT VENTRICLE RV S prime:     13.40 cm/s  PULMONARY VEINS TAPSE (M-mode): 2.4 cm      A Reversal Duration: 90.00 msec                             A Reversal Velocity: 29.80 cm/s                             Diastolic Velocity:  49.40 cm/s                             S/D Velocity:        0.80                             Systolic Velocity:   40.60 cm/s LEFT ATRIUM             Index       RIGHT ATRIUM           Index LA diam:        3.30 cm 1.70 cm/m  RA Area:     14.10 cm LA Vol (A2C):   45.8 ml 23.59 ml/m RA Volume:   37.50 ml  19.31 ml/m LA Vol (A4C):   57.1 ml 29.40 ml/m LA Biplane Vol: 53.0 ml 27.29 ml/m  AORTIC VALVE                    PULMONIC VALVE AV Area (Vmax):    2.60 cm     PV Vmax:       0.94 m/s AV Area (Vmean):   2.33 cm     PV Vmean:      78.000 cm/s AV Area (VTI):     2.67 cm     PV VTI:        0.157 m AV Vmax:             135.00 cm/s  PV Peak grad:  3.5 mmHg AV Vmean:          101.100 cm/s PV Mean grad:  3.0 mmHg AV VTI:            0.207 m AV Peak Grad:      7.3 mmHg AV Mean Grad:      4.5 mmHg LVOT Vmax:         84.50 cm/s LVOT Vmean:        56.700 cm/s LVOT VTI:          0.133 m LVOT/AV VTI ratio: 0.64  AORTA Ao Root diam: 3.20 cm Ao Asc diam:  2.80 cm MITRAL VALVE MV Area (PHT): 6.83 cm    SHUNTS MV Decel Time: 111 msec    Systemic VTI:  0.13 m MV E velocity: 77.10 cm/s  Systemic Diam: 2.30 cm MV A velocity: 81.20 cm/s MV E/A ratio:  0.95 Peter Nishan MD Electronically signed by Peter Nishan MD Signature Date/Time: 05/25/2020/9:09:26 AM    Final    VAS US UPPER EXTREMITY VENOUS DUPLEX  Result Date: 05/25/2020 UPPER VENOUS STUDY  Indications: Pain, swelling, redness (linear pattern) Risk Factors: Donates plasma regularly. Comparison Study: No previous exams Performing  Technologist: Jody Hill  Examination Guidelines: A complete evaluation includes B-mode imaging, spectral Doppler, color Doppler, and power Doppler as needed of all accessible portions of each vessel. Bilateral testing is considered an integral part of a complete examination. Limited examinations for reoccurring indications may be performed as noted.  Right Findings: +----------+------------+---------+-----------+----------+-------+ RIGHT     CompressiblePhasicitySpontaneousPropertiesSummary +----------+------------+---------+-----------+----------+-------+ Subclavian    Full       Yes       Yes                      +----------+------------+---------+-----------+----------+-------+  Left Findings: +----------+------------+---------+-----------+----------+-------+ LEFT      CompressiblePhasicitySpontaneousPropertiesSummary +----------+------------+---------+-----------+----------+-------+ IJV           Full       Yes       Yes                      +----------+------------+---------+-----------+----------+-------+ Subclavian    Full       Yes       Yes                      +----------+------------+---------+-----------+----------+-------+ Axillary      Full       Yes       Yes                      +----------+------------+---------+-----------+----------+-------+ Brachial      Full       Yes       Yes                      +----------+------------+---------+-----------+----------+-------+ Radial        Full                                          +----------+------------+---------+-----------+----------+-------+ Ulnar         Full                                          +----------+------------+---------+-----------+----------+-------+   Cephalic      None       No        No                Acute  +----------+------------+---------+-----------+----------+-------+ Basilic       Full       Yes       Yes                       +----------+------------+---------+-----------+----------+-------+  Summary:  Right: No evidence of thrombosis in the subclavian.  Left: No evidence of deep vein thrombosis in the upper extremity. Findings consistent with acute superficial vein thrombosis involving the left cephalic vein.  *See table(s) above for measurements and observations.  Diagnosing physician: Charles Fields MD Electronically signed by Charles Fields MD on 05/25/2020 at 3:00:55 PM.    Final         Scheduled Meds: . fentaNYL (SUBLIMAZE) injection  50 mcg Intravenous Once  . heparin  5,000 Units Subcutaneous Q8H   Continuous Infusions: .  ceFAZolin (ANCEF) IV Stopped (05/26/20 0604)  . lactated ringers 75 mL/hr at 05/26/20 0834     LOS: 2 days    Time spent: 35 minutes    Meta Kroenke, MD [facility] Pager [phone] 

## 2020-05-26 NOTE — Care Management (Signed)
1258 05-26-20 Case Manager spoke with patient and he is without insurance and a primary care provider at this time. Case Manager discussed with the patient regarding scheduling a hospital follow up appointment and the patient was agreeable. Case Manager scheduled the appointment with the [facility]- appointment placed on the AVS. Case Manager will follow the patient to see if he needs assistance with medications via MATCH. Gala Lewandowsky, RN,BSN Case Manager

## 2020-05-26 NOTE — Progress Notes (Signed)
[facility]  Date of Admission:  05/24/2020     Total days of antibiotics 2         ASSESSMENT:  Mr. Dareen Pianonderson has increased shortness of breath with increased work of breathing requiring assistance to lean forward. He is tachypnea with diaphoresis on exam. Concern for worsening respiratory status with PE, pneumothorax, pneumonia, or septic emboli as differentials. STAT chest x-ray ordered and primary team notified of events. Left arm cellulitis appears improved. Blood cultures drawn last night and pending for clearance of bacteremia. Continue current dose of Ancef for MSSA bacteremia. Monitor cultures for clearance of bacteremia.    PLAN:  1. Continue Cefazolin 2. STAT chest x-ray secondary to increased work of breathing.  3. Monitor cultures for clearance of bacteremia.   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 fentaNYL (SUBLIMAZE) injection  50 mcg Intravenous Once  . heparin  5,000 Units Subcutaneous Q8H    SUBJECTIVE:  Afebrile overnight. Having increased shortness of breath that is exacerbated by movement.   No Known Allergies   Review of Systems: Review of Systems  Constitutional: Positive for diaphoresis. Negative for chills, fever and weight loss.  Respiratory: Positive for shortness of breath. Negative for cough and wheezing.   Cardiovascular: Negative for chest pain and leg swelling.  Gastrointestinal: Negative for abdominal pain, constipation, diarrhea, nausea and vomiting.  Skin: Negative for rash.      OBJECTIVE: Vitals:   05/25/20 2116 05/25/20 2214 05/26/20 0300 05/26/20 0807  BP: 132/78 120/80 112/82   Pulse: (!) 110 (!) 110 (!) 105   Resp: (!) 22 20 20    Temp: 99.6 F (37.6 C) 99.8 F (37.7 C) 98.9 F (37.2 C) 100.1 F (37.8 C)  TempSrc: Oral Oral Axillary Oral  SpO2: 96% 94% 98%   Weight:      Height:       Body mass index is 18.75 kg/m.  Physical  Exam Constitutional:      General: He is not in acute distress.    Appearance: He is well-developed. He is ill-appearing and diaphoretic.  Cardiovascular:     Rate and Rhythm: Normal rate and regular rhythm.     Heart sounds: Normal heart sounds.  Pulmonary:     Effort: Pulmonary effort is normal. Tachypnea present.     Breath sounds: Normal breath sounds.  Skin:    General: Skin is warm.     Comments: Decreased redness and warmth compared to previous.  Neurological:     Mental Status: He is alert and oriented to person, place, and time.  Psychiatric:        Behavior: Behavior normal.        Thought Content: Thought content normal.        Judgment: Judgment normal.     Lab Results Lab Results  Component Value Date   WBC 5.3 05/26/2020   HGB 13.7 05/26/2020   HCT 39.1 05/26/2020   MCV 91.6 05/26/2020   PLT 180 05/26/2020    Lab Results  Component Value Date   CREATININE 2.53 (H) 05/26/2020   BUN 42 (H) 05/26/2020   NA 133 (L) 05/26/2020   K 3.7 05/26/2020   CL 100 05/26/2020   CO2 23 05/26/2020    Lab Results  Component Value Date   ALT 24 05/24/2020   AST 35 05/24/2020   ALKPHOS 56 05/24/2020   BILITOT 1.2 05/24/2020     Microbiology: Recent  Results (from the past 240 hour(s))  Blood Culture (routine x 2)     Status: Abnormal (Preliminary result)   Collection Time: 05/24/20  3:08 PM   Specimen: Left Antecubital; Blood  Result Value Ref Range Status   Specimen Description LEFT ANTECUBITAL  Final   Special Requests   Final    BOTTLES DRAWN AEROBIC AND ANAEROBIC Blood Culture results may not be optimal due to an inadequate volume of blood received in culture bottles   Culture  Setup Time   Final    GRAM POSITIVE COCCI IN CLUSTERS IN BOTH AEROBIC AND ANAEROBIC BOTTLES CRITICAL RESULT CALLED TO, READ BACK BY AND VERIFIED WITH: PHARMD A WOLF [id number] AT 754 AM BY CM    Culture (A)  Final    STAPHYLOCOCCUS AUREUS SUSCEPTIBILITIES TO FOLLOW Performed at [facility], [address], [place], [place] [address]    Report Status PENDING  Incomplete  Blood Culture ID Panel (Reflexed)     Status: Abnormal   Collection Time: 05/24/20  3:08 PM  Result Value Ref Range Status   Enterococcus faecalis NOT DETECTED NOT DETECTED Final   Enterococcus Faecium NOT DETECTED NOT DETECTED Final   Listeria monocytogenes NOT DETECTED NOT DETECTED Final   Staphylococcus species DETECTED (A) NOT DETECTED Final    Comment: CRITICAL RESULT CALLED TO, READ BACK BY AND VERIFIED WITH: PHARMD A WOLF [id number] AT 800 BY CM    Staphylococcus aureus (BCID) DETECTED (A) NOT DETECTED Final    Comment: CRITICAL RESULT CALLED TO, READ BACK BY AND VERIFIED WITH: PHARMD A WOLF [id number] AT 800 BY CM    Staphylococcus epidermidis NOT DETECTED NOT DETECTED Final   Staphylococcus lugdunensis NOT DETECTED NOT DETECTED Final   Streptococcus species NOT DETECTED NOT DETECTED Final   Streptococcus agalactiae NOT DETECTED NOT DETECTED Final   Streptococcus pneumoniae NOT DETECTED NOT DETECTED Final   Streptococcus pyogenes NOT DETECTED NOT DETECTED Final   A.calcoaceticus-baumannii NOT DETECTED NOT DETECTED Final   Bacteroides fragilis NOT DETECTED NOT DETECTED Final   Enterobacterales NOT DETECTED NOT DETECTED Final   Enterobacter cloacae complex NOT DETECTED NOT DETECTED Final   Escherichia coli NOT DETECTED NOT DETECTED Final   Klebsiella aerogenes NOT DETECTED NOT DETECTED Final   Klebsiella oxytoca NOT DETECTED NOT DETECTED Final   Klebsiella pneumoniae NOT DETECTED NOT DETECTED Final   Proteus species NOT DETECTED NOT DETECTED Final   Salmonella species NOT DETECTED NOT DETECTED Final   Serratia marcescens NOT DETECTED NOT DETECTED Final   Haemophilus influenzae NOT DETECTED NOT DETECTED Final   Neisseria meningitidis NOT DETECTED NOT DETECTED Final   Pseudomonas aeruginosa NOT DETECTED NOT DETECTED Final   Stenotrophomonas maltophilia NOT DETECTED NOT DETECTED Final    Candida albicans NOT DETECTED NOT DETECTED Final   Candida auris NOT DETECTED NOT DETECTED Final   Candida glabrata NOT DETECTED NOT DETECTED Final   Candida krusei NOT DETECTED NOT DETECTED Final   Candida parapsilosis NOT DETECTED NOT DETECTED Final   Candida tropicalis NOT DETECTED NOT DETECTED Final   Cryptococcus neoformans/gattii NOT DETECTED NOT DETECTED Final   Meth resistant mecA/C and MREJ NOT DETECTED NOT DETECTED Final    Comment: Performed at [facility], [address] [place], [place] [address]  Resp Panel by RT-PCR (Flu A&B, Covid) Nasopharyngeal Swab     Status: None   Collection Time: 05/24/20  3:14 PM   Specimen: Nasopharyngeal Swab; Nasopharyngeal(NP) swabs in vial transport medium  Result Value Ref Range Status  SARS Coronavirus 2 by RT PCR NEGATIVE NEGATIVE Final    Comment: (NOTE) SARS-CoV-2 target nucleic acids are NOT DETECTED.  The SARS-CoV-2 RNA is generally detectable in upper respiratory specimens during the acute phase of infection. The lowest concentration of SARS-CoV-2 viral copies this assay can detect is 138 copies/mL. A negative result does not preclude SARS-Cov-2 infection and should not be used as the sole basis for treatment or other patient management decisions. A negative result may occur with  improper specimen collection/handling, submission of specimen other than nasopharyngeal swab, presence of viral mutation(s) within the areas targeted by this assay, and inadequate number of viral copies(<138 copies/mL). A negative result must be combined with clinical observations, patient history, and epidemiological information. The expected result is Negative.  Fact Sheet for Patients:  BloggerCourse.com  Fact Sheet for Healthcare Providers:  SeriousBroker.it  This test is no t yet approved or cleared by the [place] FDA and  has been authorized for detection and/or diagnosis of  SARS-CoV-2 by FDA under an Emergency Use Authorization (EUA). This EUA will remain  in effect (meaning this test can be used) for the duration of the COVID-19 declaration under Section 564(b)(1) of the Act, 21 U.S.C.section 360bbb-3(b)(1), unless the authorization is terminated  or revoked sooner.       Influenza A by PCR NEGATIVE NEGATIVE Final   Influenza B by PCR NEGATIVE NEGATIVE Final    Comment: (NOTE) The Xpert Xpress SARS-CoV-2/FLU/RSV plus assay is intended as an aid in the diagnosis of influenza from Nasopharyngeal swab specimens and should not be used as a sole basis for treatment. Nasal washings and aspirates are unacceptable for Xpert Xpress SARS-CoV-2/FLU/RSV testing.  Fact Sheet for Patients: BloggerCourse.com  Fact Sheet for Healthcare Providers: SeriousBroker.it  This test is not yet approved or cleared by the [place] FDA and has been authorized for detection and/or diagnosis of SARS-CoV-2 by FDA under an Emergency Use Authorization (EUA). This EUA will remain in effect (meaning this test can be used) for the duration of the COVID-19 declaration under Section 564(b)(1) of the Act, 21 U.S.C. section 360bbb-3(b)(1), unless the authorization is terminated or revoked.  Performed at [facility], [address], [place], [place] [address]   Blood Culture (routine x 2)     Status: Abnormal (Preliminary result)   Collection Time: 05/24/20  9:45 PM   Specimen: BLOOD LEFT HAND  Result Value Ref Range Status   Specimen Description BLOOD LEFT HAND  Final   Special Requests   Final    BOTTLES DRAWN AEROBIC AND ANAEROBIC Blood Culture results may not be optimal due to an inadequate volume of blood received in culture bottles   Culture  Setup Time   Final    GRAM POSITIVE COCCI IN CLUSTERS IN BOTH AEROBIC AND ANAEROBIC BOTTLES CRITICAL VALUE NOTED.  VALUE IS CONSISTENT WITH PREVIOUSLY REPORTED AND CALLED  VALUE. Performed at [facility], [address] [place], [place] [address]    Culture STAPHYLOCOCCUS AUREUS (A)  Final   Report Status PENDING  Incomplete  MRSA PCR Screening     Status: None   Collection Time: 05/24/20 11:46 PM   Specimen: Nasopharyngeal  Result Value Ref Range Status   MRSA by PCR NEGATIVE NEGATIVE Final    Comment:        The GeneXpert MRSA Assay (FDA approved for NASAL specimens only), is one component of a comprehensive MRSA colonization surveillance program. It is not intended to diagnose MRSA infection nor  to guide or monitor treatment for MRSA infections. Performed at [facility], [address], [place], [place] [address]   Urine culture     Status: None   Collection Time: 05/24/20 11:59 PM   Specimen: In/Out Cath Urine  Result Value Ref Range Status   Specimen Description IN/OUT CATH URINE  Final   Special Requests NONE  Final   Culture   Final    NO GROWTH Performed at [facility], [address] [place], [place] [address]    Report Status 05/26/2020 FINAL  Final  Culture, blood (routine x 2)     Status: None (Preliminary result)   Collection Time: 05/25/20 10:19 PM   Specimen: BLOOD LEFT HAND  Result Value Ref Range Status   Specimen Description BLOOD LEFT HAND  Final   Special Requests   Final    BOTTLES DRAWN AEROBIC AND ANAEROBIC Blood Culture adequate volume   Culture   Final    NO GROWTH < 12 HOURS Performed at [facility], [address] [place], [place] [address]    Report Status PENDING  Incomplete  Culture, blood (routine x 2)     Status: None (Preliminary result)   Collection Time: 05/25/20 10:32 PM   Specimen: BLOOD RIGHT HAND  Result Value Ref Range Status   Specimen Description BLOOD RIGHT HAND  Final   Special Requests AEROBIC BOTTLE ONLY Blood Culture adequate volume  Final   Culture   Final    NO GROWTH < 12 HOURS Performed at [facility], [address], [place], [place]  [address]    Report Status PENDING  Incomplete     Marcos Eke, NP [facility]  05/26/2020  9:43 AM

## 2020-05-26 NOTE — Progress Notes (Signed)
    [facility] has been requested to perform a transesophageal echocardiogram on Edward Mueller for bacteremia.  After careful review of history and examination, the risks and benefits of transesophageal echocardiogram have been explained including risks of esophageal damage, perforation (1:10,000 risk), bleeding, pharyngeal hematoma as well as other potential complications associated with conscious sedation including aspiration, arrhythmia, respiratory failure and death. Alternatives to treatment were discussed, questions were answered. Patient is willing to proceed.   Pt is scheduled for TEE tomorrow 05/27/20 with Dr. Royann Shivers at 3pm. NPO at [facility] please.  Roe Rutherford Trashaun Streight, [place]  05/26/2020 4:58 PM

## 2020-05-27 ENCOUNTER — Inpatient Hospital Stay (HOSPITAL_COMMUNITY): Payer: Medicaid Other | Admitting: Anesthesiology

## 2020-05-27 ENCOUNTER — Inpatient Hospital Stay (HOSPITAL_COMMUNITY): Payer: Medicaid Other

## 2020-05-27 ENCOUNTER — Encounter (HOSPITAL_COMMUNITY): Payer: Self-pay | Admitting: Pulmonary Disease

## 2020-05-27 ENCOUNTER — Encounter (HOSPITAL_COMMUNITY)
Admission: EM | Disposition: A | Discharge status: Home Health | Payer: Self-pay | Source: Home / Self Care | Attending: Internal Medicine

## 2020-05-27 DIAGNOSIS — R7881 Bacteremia: Secondary | ICD-10-CM

## 2020-05-27 DIAGNOSIS — B9561 Methicillin susceptible Staphylococcus aureus infection as the cause of diseases classified elsewhere: Secondary | ICD-10-CM

## 2020-05-27 HISTORY — PX: TEE WITHOUT CARDIOVERSION: SHX5443

## 2020-05-27 LAB — CULTURE, BLOOD (ROUTINE X 2)

## 2020-05-27 LAB — BASIC METABOLIC PANEL
Anion gap: 7 (ref 5–15)
BUN: 46 mg/dL — ABNORMAL HIGH (ref 6–20)
CO2: 25 mmol/L (ref 22–32)
Calcium: 7.6 mg/dL — ABNORMAL LOW (ref 8.9–10.3)
Chloride: 101 mmol/L (ref 98–111)
Creatinine, Ser: 1.9 mg/dL — ABNORMAL HIGH (ref 0.61–1.24)
GFR, Estimated: 42 mL/min — ABNORMAL LOW (ref >60)
Glucose, Bld: 116 mg/dL — ABNORMAL HIGH (ref 70–99)
Potassium: 3.5 mmol/L (ref 3.5–5.1)
Sodium: 133 mmol/L — ABNORMAL LOW (ref 135–145)

## 2020-05-27 LAB — CBC
HCT: 36.1 % — ABNORMAL LOW (ref 39.0–52.0)
Hemoglobin: 12.7 g/dL — ABNORMAL LOW (ref 13.0–17.0)
MCH: 32.5 pg (ref 26.0–34.0)
MCHC: 35.2 g/dL (ref 30.0–36.0)
MCV: 92.3 fL (ref 80.0–100.0)
Platelets: 160 10*3/uL (ref 150–400)
RBC: 3.91 MIL/uL — ABNORMAL LOW (ref 4.22–5.81)
RDW: 13.1 % (ref 11.5–15.5)
WBC: 10.9 10*3/uL — ABNORMAL HIGH (ref 4.0–10.5)
nRBC: 0 % (ref 0.0–0.2)

## 2020-05-27 LAB — PHOSPHORUS: Phosphorus: 2.4 mg/dL — ABNORMAL LOW (ref 2.5–4.6)

## 2020-05-27 LAB — MAGNESIUM: Magnesium: 2.8 mg/dL — ABNORMAL HIGH (ref 1.7–2.4)

## 2020-05-27 SURGERY — ECHOCARDIOGRAM, TRANSESOPHAGEAL
Anesthesia: Monitor Anesthesia Care

## 2020-05-27 MED ORDER — LACTATED RINGERS IV SOLN: INTRAVENOUS | Status: DC

## 2020-05-27 MED ORDER — PROPOFOL 500 MG/50ML IV EMUL
INTRAVENOUS | Status: DC | PRN
  Administered 2020-05-27: 150 ug/kg/min via INTRAVENOUS

## 2020-05-27 MED ORDER — SODIUM CHLORIDE 0.9 % IV SOLN
INTRAVENOUS | Status: AC | PRN
  Administered 2020-05-27: 500 mL via INTRAVENOUS

## 2020-05-27 NOTE — Anesthesia Postprocedure Evaluation (Signed)
Anesthesia Post Note  Patient: Edward Mueller  Procedure(s) Performed: TRANSESOPHAGEAL ECHOCARDIOGRAM (TEE) (N/A )     Patient location during evaluation: PACU Anesthesia Type: MAC Level of consciousness: awake and alert Pain management: pain level controlled Vital Signs Assessment: post-procedure vital signs reviewed and stable Respiratory status: spontaneous breathing, nonlabored ventilation, respiratory function stable and patient connected to nasal cannula oxygen Cardiovascular status: stable and blood pressure returned to baseline Postop Assessment: no apparent nausea or vomiting Anesthetic complications: no   No complications documented.  Last Vitals:  Vitals:   05/27/20 1225 05/27/20 1616  BP: 123/78 121/81  Pulse:  99  Resp: (!) 46 (!) 40  Temp: 37.1 C (!) 38.2 C  SpO2: 95% 96%    Last Pain:  Vitals:   05/27/20 1616  TempSrc: Oral  PainSc:                  Edward Mueller S

## 2020-05-27 NOTE — Plan of Care (Signed)

## 2020-05-27 NOTE — Interval H&P Note (Signed)
History and Physical Interval Note:  05/27/2020 11:33 AM  [name]  has presented today for surgery, with the diagnosis of BACTEREMIA.  The various methods of treatment have been discussed with the patient and family. After consideration of risks, benefits and other options for treatment, the patient has consented to  Procedure(s): TRANSESOPHAGEAL ECHOCARDIOGRAM (TEE) (N/A) as a surgical intervention.  The patient's history has been reviewed, patient examined, no change in status, stable for surgery.  I have reviewed the patient's chart and labs.  Questions were answered to the patient's satisfaction.     Edward Mueller

## 2020-05-27 NOTE — Progress Notes (Signed)
PROGRESS NOTE  Edward Mueller UUV:[id number] DOB: Dec 03, 1970 DOA: 05/24/2020 PCP: Pcp, No  HPI/Recap of past 24 hours: 50 year old gentleman with no reported medical problems who donates plasma 2 times a week, donated plasma on 4/1 and after about 24 hours he started not feeling well, generalized malaise, nausea vomiting and shortness of breath and pleuritic chest pain.  Cough with blood-tinged sputum.  Came to the emergency room on 4/3, was found to be febrile and tachypneic with lactic acidosis.  Resuscitated and admitted to ICU then transferred to progressive care unit with stable blood pressures. In the emergency room, tachypneic.  Temperature one 1.8.  Lactic acid 3.1.  Blood pressures adequate.  CT scan with extensive pneumonia.  Left arm with minimal cellulitis.  Ultimately blood cultures growing MSSA bacteremia.  Infectious disease consulted and following.  05/27/20: Patient was seen and examined at his bedside.  Reports dyspnea with minimal exertion.  No chest pain at the time of this exam however complains of right upper quadrant abdominal pain worse with inspiration.  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 Appears to have suspected bilateral pulmonary emboli concerning for endocarditis per infectious disease. TTE negative for vegetation, TEE no evidence of septic endocarditis. Currently on cefazolin 2 g IV every 8 hours, day #3 Repeat blood cultures taken on 05/25/2020 - to date. Appreciate infectious disease assistance. Management per infectious disease.  Acute hypoxic respiratory failure secondary to suspected bilateral septic pulmonary emboli D-dimer on presentation greater than 4 Repeat D-dimer on 05/28/2020. Maintain a saturation greater than 90%. He is currently requiring 4 L to maintain O2 saturation greater than 92% Not on ulcerative mentation  at baseline.  Suspected bilateral septic pulmonary emboli Management as stated above. Treat underlying condition Continue IV antibiotics Management per infectious disease.  AKI on CKD 3B Presented with creatinine of 2.7 Creatinine downtrending 1.9 with GFR 42 Avoid nephrotoxic agent Monitor urine output Repeat renal panel in the morning.  Hypovolemic hyponatremia Serum sodium 133 Is currently on LR at 75 cc/h  Hypophosphatemia Encourage oral intake Serum phosphorus 2.4 Replete and repeat level in the morning.  THC use UDS positive for THC on 05/24/2020 Denies use of cocaine or intravenous drug abuse    Code Status: Full code.  Family Communication: Updated his sister via phone on 05/27/2020.  Disposition Plan: Likely will discharge to home once oxygen requirement is improved and infectious disease signs off.   Consultants:  Infectious disease.  Procedures:  2D echo on 05/25/2020  TEE on 05/27/2020.  Antimicrobials:  Cefazolin for 05/25/20>>  DVT prophylaxis: Subcu heparin 3 times daily.  Status is: Inpatient    Dispo: The patient is from: Home.              Anticipated d/c is to: Home.              Patient currently not stable for discharge due to acute hypoxic respiratory failure, management of MSSA bacteremia.   Difficult to place patient: Not applicable.        Objective: Vitals:   05/27/20 1030 05/27/20 1143 05/27/20 1150 05/27/20 1200  BP: 137/79 (!) 116/55 (!) 106/59 (!) 122/58  Pulse: 96 99 93 94  Resp: (!) 44 (!) 30 (!) 25 (!) 55  Temp: 99.4 F (37.4 C) 98.9 F (37.2 C)    TempSrc: Oral Oral    SpO2: 100% 93% 93% 100%  Weight:  Height:        Intake/Output Summary (Last 24 hours) at 05/27/2020 1221 Last data filed at 05/27/2020 0520 Gross per 24 hour  Intake 2336.29 ml  Output 1550 ml  Net 786.29 ml   Filed Weights   05/24/20 1410  Weight: 68 kg    Exam:  . General: 50 y.o. year-old male well developed well nourished in  no acute distress.  Alert and oriented x3. . Cardiovascular: Regular rate and rhythm with no rubs or gallops.  No thyromegaly or JVD noted.   Marland Kitchen Respiratory: Diffuse rales bilaterally no wheezing noted.  Good inspiratory effort.   . Abdomen: Soft nontender nondistended with normal bowel sounds x4 quadrants. . Musculoskeletal: No lower extremity edema. 2/4 pulses in all 4 extremities. . Skin: No ulcerative lesions noted or rashes, . Psychiatry: Mood is appropriate for condition and setting   Data Reviewed: CBC: Recent Labs  Lab 05/24/20 1417 05/24/20 1512 05/25/20 0218 05/26/20 0304 05/27/20 0244  WBC 6.4  --  3.2* 5.3 10.9*  NEUTROABS 5.8  --   --   --   --   HGB 16.8 16.7 14.5 13.7 12.7*  HCT 48.0 49.0 42.2 39.1 36.1*  MCV 95.2  --  95.9 91.6 92.3  PLT 207  --  168 180 160   Basic Metabolic Panel: Recent Labs  Lab 05/24/20 1417 05/24/20 1512 05/25/20 0218 05/26/20 0304 05/27/20 0244  NA 135 131* 134* 133* 133*  K 3.9 4.1 4.0 3.7 3.5  CL 97*  --  102 100 101  CO2 25  --  19* 23 25  GLUCOSE 127*  --  124* 107* 116*  BUN 25*  --  34* 42* 46*  CREATININE 2.68*  --  2.79* 2.53* 1.90*  CALCIUM 7.8*  --  7.1* 7.5* 7.6*  MG  --   --  1.7 2.1 2.8*  PHOS  --   --   --  3.5 2.4*   GFR: Estimated Creatinine Clearance: 44.7 mL/min (A) (by C-G formula based on SCr of 1.9 mg/dL (H)). Liver Function Tests: Recent Labs  Lab 05/24/20 1417  AST 35  ALT 24  ALKPHOS 56  BILITOT 1.2  PROT 6.0*  ALBUMIN 2.6*   No results for input(s): LIPASE, AMYLASE in the last 168 hours. No results for input(s): AMMONIA in the last 168 hours. Coagulation Profile: Recent Labs  Lab 05/24/20 1417  INR 1.1   Cardiac Enzymes: No results for input(s): CKTOTAL, CKMB, CKMBINDEX, TROPONINI in the last 168 hours. BNP (last 3 results) No results for input(s): PROBNP in the last 8760 hours. HbA1C: No results for input(s): HGBA1C in the last 72 hours. CBG: No results for input(s): GLUCAP in the  last 168 hours. Lipid Profile: No results for input(s): CHOL, HDL, LDLCALC, TRIG, CHOLHDL, LDLDIRECT in the last 72 hours. Thyroid Function Tests: No results for input(s): TSH, T4TOTAL, FREET4, T3FREE, THYROIDAB in the last 72 hours. Anemia Panel: No results for input(s): VITAMINB12, FOLATE, FERRITIN, TIBC, IRON, RETICCTPCT in the last 72 hours. Urine analysis:    Component Value Date/Time   COLORURINE YELLOW 05/24/2020 0502   APPEARANCEUR HAZY (A) 05/24/2020 0502   LABSPEC 1.016 05/24/2020 0502   PHURINE 5.0 05/24/2020 0502   GLUCOSEU 50 (A) 05/24/2020 0502   HGBUR MODERATE (A) 05/24/2020 0502   BILIRUBINUR NEGATIVE 05/24/2020 0502   KETONESUR NEGATIVE 05/24/2020 0502   PROTEINUR 100 (A) 05/24/2020 0502   NITRITE NEGATIVE 05/24/2020 0502   LEUKOCYTESUR NEGATIVE 05/24/2020 0502   Sepsis  Labs: @LABRCNTIP (procalcitonin:4,lacticidven:4)  ) Recent Results (from the past 240 hour(s))  Blood Culture (routine x 2)     Status: Abnormal   Collection Time: 05/24/20  3:08 PM   Specimen: Left Antecubital; Blood  Result Value Ref Range Status   Specimen Description LEFT ANTECUBITAL  Final   Special Requests   Final    BOTTLES DRAWN AEROBIC AND ANAEROBIC Blood Culture results may not be optimal due to an inadequate volume of blood received in culture bottles   Culture  Setup Time   Final    GRAM POSITIVE COCCI IN CLUSTERS IN BOTH AEROBIC AND ANAEROBIC BOTTLES CRITICAL RESULT CALLED TO, READ BACK BY AND VERIFIED WITH: PHARMD A WOLF 07/24/20 AT 754 AM BY CM Performed at [facility], [address], [place], [place]    Culture STAPHYLOCOCCUS AUREUS (A)  Final   Report Status 05/27/2020 FINAL  Final   Organism ID, Bacteria STAPHYLOCOCCUS AUREUS  Final      Susceptibility   Staphylococcus aureus - MIC*    CIPROFLOXACIN <=0.5 SENSITIVE Sensitive     ERYTHROMYCIN >=8 RESISTANT Resistant     GENTAMICIN <=0.5 SENSITIVE Sensitive     OXACILLIN 0.5 SENSITIVE Sensitive      TETRACYCLINE <=1 SENSITIVE Sensitive     VANCOMYCIN <=0.5 SENSITIVE Sensitive     TRIMETH/SULFA <=10 SENSITIVE Sensitive     CLINDAMYCIN <=0.25 SENSITIVE Sensitive     RIFAMPIN <=0.5 SENSITIVE Sensitive     Inducible Clindamycin NEGATIVE Sensitive     * STAPHYLOCOCCUS AUREUS  Blood Culture ID Panel (Reflexed)     Status: Abnormal   Collection Time: 05/24/20  3:08 PM  Result Value Ref Range Status   Enterococcus faecalis NOT DETECTED NOT DETECTED Final   Enterococcus Faecium NOT DETECTED NOT DETECTED Final   Listeria monocytogenes NOT DETECTED NOT DETECTED Final   Staphylococcus species DETECTED (A) NOT DETECTED Final    Comment: CRITICAL RESULT CALLED TO, READ BACK BY AND VERIFIED WITH: PHARMD A WOLF 07/24/20 AT 800 BY CM    Staphylococcus aureus (BCID) DETECTED (A) NOT DETECTED Final    Comment: CRITICAL RESULT CALLED TO, READ BACK BY AND VERIFIED WITH: PHARMD A WOLF [id number] AT 800 BY CM    Staphylococcus epidermidis NOT DETECTED NOT DETECTED Final   Staphylococcus lugdunensis NOT DETECTED NOT DETECTED Final   Streptococcus species NOT DETECTED NOT DETECTED Final   Streptococcus agalactiae NOT DETECTED NOT DETECTED Final   Streptococcus pneumoniae NOT DETECTED NOT DETECTED Final   Streptococcus pyogenes NOT DETECTED NOT DETECTED Final   A.calcoaceticus-baumannii NOT DETECTED NOT DETECTED Final   Bacteroides fragilis NOT DETECTED NOT DETECTED Final   Enterobacterales NOT DETECTED NOT DETECTED Final   Enterobacter cloacae complex NOT DETECTED NOT DETECTED Final   Escherichia coli NOT DETECTED NOT DETECTED Final   Klebsiella aerogenes NOT DETECTED NOT DETECTED Final   Klebsiella oxytoca NOT DETECTED NOT DETECTED Final   Klebsiella pneumoniae NOT DETECTED NOT DETECTED Final   Proteus species NOT DETECTED NOT DETECTED Final   Salmonella species NOT DETECTED NOT DETECTED Final   Serratia marcescens NOT DETECTED NOT DETECTED Final   Haemophilus influenzae NOT DETECTED NOT DETECTED Final    Neisseria meningitidis NOT DETECTED NOT DETECTED Final   Pseudomonas aeruginosa NOT DETECTED NOT DETECTED Final   Stenotrophomonas maltophilia NOT DETECTED NOT DETECTED Final   Candida albicans NOT DETECTED NOT DETECTED Final   Candida auris NOT DETECTED NOT DETECTED Final   Candida glabrata NOT DETECTED NOT DETECTED Final  Candida krusei NOT DETECTED NOT DETECTED Final   Candida parapsilosis NOT DETECTED NOT DETECTED Final   Candida tropicalis NOT DETECTED NOT DETECTED Final   Cryptococcus neoformans/gattii NOT DETECTED NOT DETECTED Final   Meth resistant mecA/C and MREJ NOT DETECTED NOT DETECTED Final    Comment: Performed at [facility], [address] [place], [place] [address]  Resp Panel by RT-PCR (Flu A&B, Covid) Nasopharyngeal Swab     Status: None   Collection Time: 05/24/20  3:14 PM   Specimen: Nasopharyngeal Swab; Nasopharyngeal(NP) swabs in vial transport medium  Result Value Ref Range Status   SARS Coronavirus 2 by RT PCR NEGATIVE NEGATIVE Final    Comment: (NOTE) SARS-CoV-2 target nucleic acids are NOT DETECTED.  The SARS-CoV-2 RNA is generally detectable in upper respiratory specimens during the acute phase of infection. The lowest concentration of SARS-CoV-2 viral copies this assay can detect is 138 copies/mL. A negative result does not preclude SARS-Cov-2 infection and should not be used as the sole basis for treatment or other patient management decisions. A negative result may occur with  improper specimen collection/handling, submission of specimen other than nasopharyngeal swab, presence of viral mutation(s) within the areas targeted by this assay, and inadequate number of viral copies(<138 copies/mL). A negative result must be combined with clinical observations, patient history, and epidemiological information. The expected result is Negative.  Fact Sheet for Patients:  BloggerCourse.comhttps://www.fda.gov/media/152166/download  Fact Sheet for Healthcare  Providers:  SeriousBroker.ithttps://www.fda.gov/media/152162/download  This test is no t yet approved or cleared by the [place] FDA and  has been authorized for detection and/or diagnosis of SARS-CoV-2 by FDA under an Emergency Use Authorization (EUA). This EUA will remain  in effect (meaning this test can be used) for the duration of the COVID-19 declaration under Section 564(b)(1) of the Act, 21 U.S.C.section 360bbb-3(b)(1), unless the authorization is terminated  or revoked sooner.       Influenza A by PCR NEGATIVE NEGATIVE Final   Influenza B by PCR NEGATIVE NEGATIVE Final    Comment: (NOTE) The Xpert Xpress SARS-CoV-2/FLU/RSV plus assay is intended as an aid in the diagnosis of influenza from Nasopharyngeal swab specimens and should not be used as a sole basis for treatment. Nasal washings and aspirates are unacceptable for Xpert Xpress SARS-CoV-2/FLU/RSV testing.  Fact Sheet for Patients: BloggerCourse.comhttps://www.fda.gov/media/152166/download  Fact Sheet for Healthcare Providers: SeriousBroker.ithttps://www.fda.gov/media/152162/download  This test is not yet approved or cleared by the [place] FDA and has been authorized for detection and/or diagnosis of SARS-CoV-2 by FDA under an Emergency Use Authorization (EUA). This EUA will remain in effect (meaning this test can be used) for the duration of the COVID-19 declaration under Section 564(b)(1) of the Act, 21 U.S.C. section 360bbb-3(b)(1), unless the authorization is terminated or revoked.  Performed at [facility], [address] [place], [place] [address]   Blood Culture (routine x 2)     Status: Abnormal   Collection Time: 05/24/20  9:45 PM   Specimen: BLOOD LEFT HAND  Result Value Ref Range Status   Specimen Description BLOOD LEFT HAND  Final   Special Requests   Final    BOTTLES DRAWN AEROBIC AND ANAEROBIC Blood Culture results may not be optimal due to an inadequate volume of blood received in culture bottles   Culture  Setup Time    Final    GRAM POSITIVE COCCI IN CLUSTERS IN BOTH AEROBIC AND ANAEROBIC BOTTLES CRITICAL VALUE NOTED.  VALUE IS CONSISTENT WITH PREVIOUSLY REPORTED AND CALLED VALUE.  Culture (A)  Final    STAPHYLOCOCCUS AUREUS SUSCEPTIBILITIES PERFORMED ON PREVIOUS CULTURE WITHIN THE LAST 5 DAYS. Performed at [facility], [address], [place], [place] [address]    Report Status 05/27/2020 FINAL  Final  MRSA PCR Screening     Status: None   Collection Time: 05/24/20 11:46 PM   Specimen: Nasopharyngeal  Result Value Ref Range Status   MRSA by PCR NEGATIVE NEGATIVE Final    Comment:        The GeneXpert MRSA Assay (FDA approved for NASAL specimens only), is one component of a comprehensive MRSA colonization surveillance program. It is not intended to diagnose MRSA infection nor to guide or monitor treatment for MRSA infections. Performed at [facility], [address] [place], [place] [address]   Urine culture     Status: None   Collection Time: 05/24/20 11:59 PM   Specimen: In/Out Cath Urine  Result Value Ref Range Status   Specimen Description IN/OUT CATH URINE  Final   Special Requests NONE  Final   Culture   Final    NO GROWTH Performed at [facility], [address] [place], [place] [address]    Report Status 05/26/2020 FINAL  Final  Culture, blood (routine x 2)     Status: None (Preliminary result)   Collection Time: 05/25/20 10:19 PM   Specimen: BLOOD LEFT HAND  Result Value Ref Range Status   Specimen Description BLOOD LEFT HAND  Final   Special Requests   Final    BOTTLES DRAWN AEROBIC AND ANAEROBIC Blood Culture adequate volume   Culture   Final    NO GROWTH < 12 HOURS Performed at [facility], [address] [place], [place] [address]    Report Status PENDING  Incomplete  Culture, blood (routine x 2)     Status: None (Preliminary result)   Collection Time: 05/25/20 10:32 PM   Specimen: BLOOD RIGHT HAND  Result Value Ref Range Status    Specimen Description BLOOD RIGHT HAND  Final   Special Requests AEROBIC BOTTLE ONLY Blood Culture adequate volume  Final   Culture   Final    NO GROWTH < 12 HOURS Performed at [facility], [address], [place], [place] [address]    Report Status PENDING  Incomplete      Studies: US RENAL  Result Date: 05/26/2020 CLINICAL DATA:  Acute kidney injury EXAM: RENAL / URINARY TRACT ULTRASOUND COMPLETE COMPARISON:  None. FINDINGS: Right Kidney: Renal measurements: 13.1 x 5.1 x 4.7 cm = volume: 166 mL. Echogenicity within normal limits. No mass or hydronephrosis visualized. Left Kidney: Renal measurements: 12.6 x 6.0 x 4.0 cm = volume: 161 mL. Mild hydronephrosis. Normal echotexture. No mass. Bladder: Appears normal for degree of bladder distention. Other: None. IMPRESSION: Mild left hydronephrosis. Electronically Signed   By: Charlett Nose M.D.   On: 05/26/2020 20:30    Scheduled Meds: . fentaNYL (SUBLIMAZE) injection  50 mcg Intravenous Once  . heparin  5,000 Units Subcutaneous Q8H    Continuous Infusions: .  ceFAZolin (ANCEF) IV Stopped (05/27/20 0543)  . lactated ringers 75 mL/hr at 05/27/20 0915     LOS: 3 days     Darlin Drop, MD [facility] Pager [phone]  If 7PM-7AM, please contact night-coverage [email or web address] Password [facility] 05/27/2020, 12:21 PM

## 2020-05-27 NOTE — Anesthesia Preprocedure Evaluation (Addendum)
Anesthesia Evaluation  Patient identified by MRN, date of birth, ID band Patient awake  General Assessment Comment:Sepsis with bacteremia  Reviewed: Allergy & Precautions, NPO status , Patient's Chart, lab work & pertinent test results  Airway Mallampati: II  TM Distance: >3 FB Neck ROM: Full    Dental no notable dental hx.    Pulmonary neg pulmonary ROS,   tachypneic 100% on 3L [place]  breath sounds clear to auscultation       Cardiovascular negative cardio ROS Normal cardiovascular exam Rhythm:Regular Rate:Normal     Neuro/Psych negative neurological ROS  negative psych ROS   GI/Hepatic negative GI ROS, Neg liver ROS,   Endo/Other  negative endocrine ROS  Renal/GU Renal InsufficiencyRenal disease  negative genitourinary   Musculoskeletal negative musculoskeletal ROS (+)   Abdominal   Peds negative pediatric ROS (+)  Hematology  (+) anemia ,   Anesthesia Other Findings   Reproductive/Obstetrics negative OB ROS                            Anesthesia Physical Anesthesia Plan  ASA: III  Anesthesia Plan: MAC   Post-op Pain Management:    Induction: Intravenous  PONV Risk Score and Plan: 1 and Propofol infusion and Treatment may vary due to age or medical condition  Airway Management Planned: Simple Face Mask  Additional Equipment:   Intra-op Plan:   Post-operative Plan:   Informed Consent: I have reviewed the patients History and Physical, chart, labs and discussed the procedure including the risks, benefits and alternatives for the proposed anesthesia with the patient or authorized representative who has indicated his/her understanding and acceptance.     Dental advisory given  Plan Discussed with: CRNA and Surgeon  Anesthesia Plan Comments:         Anesthesia Quick Evaluation

## 2020-05-27 NOTE — Transfer of Care (Signed)
Immediate Anesthesia Transfer of Care Note  Patient: Edward Mueller  Procedure(s) Performed: TRANSESOPHAGEAL ECHOCARDIOGRAM (TEE) (N/A )  Patient Location: Endoscopy Unit  Anesthesia Type:MAC  Level of Consciousness: awake, alert  and sedated  Airway & Oxygen Therapy: Patient connected to nasal cannula oxygen  Post-op Assessment: Post -op Vital signs reviewed and stable  Post vital signs: stable  Last Vitals:  Vitals Value Taken Time  BP    Temp    Pulse    Resp    SpO2      Last Pain:  Vitals:   05/27/20 1030  TempSrc: Oral  PainSc: 0-No pain      Patients Stated Pain Goal: 0 (35/36/14 4315)  Complications: No complications documented.

## 2020-05-27 NOTE — Op Note (Signed)
INDICATIONS: Staph bacteremia  PROCEDURE:   Informed consent was obtained prior to the procedure. The risks, benefits and alternatives for the procedure were discussed and the patient comprehended these risks.  Risks include, but are not limited to, cough, sore throat, vomiting, nausea, somnolence, esophageal and stomach trauma or perforation, bleeding, low blood pressure, aspiration, pneumonia, infection, trauma to the teeth and death.    After a procedural time-out, the oropharynx was anesthetized with 20% benzocaine spray.   During this procedure the patient was administered IV propofol (Anesthesiology, Dr. Okey Dupre).  The transesophageal probe was inserted in the esophagus and stomach without difficulty and multiple views were obtained.  The patient was kept under observation until the patient left the procedure room.  The patient left the procedure room in stable condition.   Agitated microbubble saline contrast was not administered.  COMPLICATIONS:    There were no immediate complications.  FINDINGS:  Normal TEE  RECOMMENDATIONS:     No TEE evidence for endocarditis.  Time Spent Directly with the Patient:  30 minutes   Lenoard Helbert 05/27/2020, 11:32 AM

## 2020-05-27 NOTE — Progress Notes (Signed)
Pts sister verbalized that she would like for whom ever was trying to contact her would please leave a message so she could return their call.

## 2020-05-28 ENCOUNTER — Inpatient Hospital Stay (HOSPITAL_COMMUNITY): Payer: Medicaid Other

## 2020-05-28 ENCOUNTER — Encounter (HOSPITAL_COMMUNITY): Payer: Self-pay | Admitting: Cardiovascular Disease

## 2020-05-28 DIAGNOSIS — I38 Endocarditis, valve unspecified: Secondary | ICD-10-CM

## 2020-05-28 DIAGNOSIS — J9601 Acute respiratory failure with hypoxia: Secondary | ICD-10-CM

## 2020-05-28 LAB — BLOOD GAS, VENOUS
Acid-Base Excess: 2.5 mmol/L — ABNORMAL HIGH (ref 0.0–2.0)
Bicarbonate: 26.6 mmol/L (ref 20.0–28.0)
Drawn by: 6760
FIO2: 97
O2 Saturation: 64 %
Patient temperature: 37
pCO2, Ven: 42 mmHg — ABNORMAL LOW (ref 44.0–60.0)
pH, Ven: 7.418 (ref 7.250–7.430)
pO2, Ven: 35 mmHg (ref 32.0–45.0)

## 2020-05-28 LAB — BASIC METABOLIC PANEL
Anion gap: 12 (ref 5–15)
BUN: 48 mg/dL — ABNORMAL HIGH (ref 6–20)
CO2: 21 mmol/L — ABNORMAL LOW (ref 22–32)
Calcium: 7.4 mg/dL — ABNORMAL LOW (ref 8.9–10.3)
Chloride: 102 mmol/L (ref 98–111)
Creatinine, Ser: 1.5 mg/dL — ABNORMAL HIGH (ref 0.61–1.24)
GFR, Estimated: 56 mL/min — ABNORMAL LOW (ref >60)
Glucose, Bld: 111 mg/dL — ABNORMAL HIGH (ref 70–99)
Potassium: 3.7 mmol/L (ref 3.5–5.1)
Sodium: 135 mmol/L (ref 135–145)

## 2020-05-28 LAB — CBC WITH DIFFERENTIAL/PLATELET
Abs Immature Granulocytes: 1.65 10*3/uL — ABNORMAL HIGH (ref 0.00–0.07)
Basophils Absolute: 0 10*3/uL (ref 0.0–0.1)
Basophils Relative: 0 %
Eosinophils Absolute: 0 10*3/uL (ref 0.0–0.5)
Eosinophils Relative: 0 %
HCT: 35.8 % — ABNORMAL LOW (ref 39.0–52.0)
Hemoglobin: 12.5 g/dL — ABNORMAL LOW (ref 13.0–17.0)
Immature Granulocytes: 10 %
Lymphocytes Relative: 3 %
Lymphs Abs: 0.6 10*3/uL — ABNORMAL LOW (ref 0.7–4.0)
MCH: 32.3 pg (ref 26.0–34.0)
MCHC: 34.9 g/dL (ref 30.0–36.0)
MCV: 92.5 fL (ref 80.0–100.0)
Monocytes Absolute: 1.3 10*3/uL — ABNORMAL HIGH (ref 0.1–1.0)
Monocytes Relative: 8 %
Neutro Abs: 12.9 10*3/uL — ABNORMAL HIGH (ref 1.7–7.7)
Neutrophils Relative %: 79 %
Platelets: UNDETERMINED 10*3/uL (ref 150–400)
RBC: 3.87 MIL/uL — ABNORMAL LOW (ref 4.22–5.81)
RDW: 13.4 % (ref 11.5–15.5)
WBC: 16.4 10*3/uL — ABNORMAL HIGH (ref 4.0–10.5)
nRBC: 0 % (ref 0.0–0.2)

## 2020-05-28 LAB — LACTIC ACID, PLASMA: Lactic Acid, Venous: 1 mmol/L (ref 0.5–1.9)

## 2020-05-28 LAB — MAGNESIUM: Magnesium: 3 mg/dL — ABNORMAL HIGH (ref 1.7–2.4)

## 2020-05-28 LAB — PROCALCITONIN: Procalcitonin: 31.08 ng/mL

## 2020-05-28 LAB — D-DIMER, QUANTITATIVE: D-Dimer, Quant: 3.96 ug/mL-FEU — ABNORMAL HIGH (ref 0.00–0.50)

## 2020-05-28 LAB — PHOSPHORUS: Phosphorus: 2.5 mg/dL (ref 2.5–4.6)

## 2020-05-28 MED ORDER — GUAIFENESIN 200 MG PO TABS
400.0000 mg | ORAL_TABLET | Freq: Two times a day (BID) | ORAL | Status: AC
  Administered 2020-05-28 – 2020-05-29 (×4): 400 mg via ORAL
  Filled 2020-05-28 (×4): qty 2

## 2020-05-28 MED ORDER — ACETAMINOPHEN 325 MG PO TABS
650.0000 mg | ORAL_TABLET | Freq: Four times a day (QID) | ORAL | Status: DC
  Administered 2020-05-28 – 2020-05-29 (×5): 650 mg via ORAL
  Filled 2020-05-28 (×5): qty 2

## 2020-05-28 NOTE — Progress Notes (Signed)
Pt. Refused cpap. 

## 2020-05-28 NOTE — Progress Notes (Signed)
RT attempted x 2 to obtain an ABG without success.  Small sample obtained was mixed venous.

## 2020-05-28 NOTE — Progress Notes (Signed)
Pharmacy Antibiotic Note  Edward Mueller is a 50 y.o. male admitted on 05/24/2020 withMSSA bacteremia (TEE negative for endocarditis)  Pharmacy has been consulted for cefazolin dosing. -WBC= 16.4 (up), tmax= 100.8 -CrCl ~ 55  Plan: cefazolin 2g IV q8h Monitor renal function, cultures, and clinical progression  Height: 6\' 3"  (190.5 cm) Weight: 68 kg (150 lb) IBW/kg (Calculated) : 84.5  Temp (24hrs), Avg:99.6 F (37.6 C), Min:98.7 F (37.1 C), Max:100.8 F (38.2 C)  Recent Labs  Lab 05/24/20 1417 05/24/20 1630 05/24/20 2146 05/25/20 0218 05/26/20 0304 05/27/20 0244 05/28/20 0255  WBC 6.4  --   --  3.2* 5.3 10.9* 16.4*  CREATININE 2.68*  --   --  2.79* 2.53* 1.90* 1.50*  LATICACIDVEN 3.2* 3.7* 3.1*  --   --   --  1.0    Estimated Creatinine Clearance: 56.7 mL/min (A) (by C-G formula based on SCr of 1.5 mg/dL (H)).    No Known Allergies  Antimicrobials this admission: Vancomycin 4/3 >> 4/4  Ceftriaxone 4/3 >> 4/4 Cefepime x1 4/3  Metronidazole x1 4/3 Cefazolin 4/4 >>  Dose adjustments this admission: N/a  Microbiology results: 4/3 Bcx:  MSSA 4/4 blood x2- ngtd  Thank you for allowing pharmacy to be a part of this patient's care.  6/3, PharmD Clinical Pharmacist **Pharmacist phone directory can now be found on [email or web address] ([email or web address]).  Listed under [facility].

## 2020-05-28 NOTE — Plan of Care (Signed)

## 2020-05-28 NOTE — Progress Notes (Signed)
Pt tele alarm alerted to desaturating to 70s, upped O2, to 7 then 6. Saturations resumed WDL.  Maralyn Sago Bubba Vanbenschoten, RN 05/28/2020

## 2020-05-28 NOTE — Progress Notes (Signed)
PROGRESS NOTE  Edward Mueller JIR:[id number] DOB: Jun 27, 1970 DOA: 05/24/2020 PCP: Pcp, No  HPI/Recap of past 60 hours: 50 year old gentleman with no reported medical problems who donates plasma 2 times a week, donated plasma on 4/1 and after about 24 hours he started not feeling well, generalized malaise, nausea vomiting and shortness of breath and pleuritic chest pain.  Cough with blood-tinged sputum.  Came to the emergency room on 4/3, was found to be febrile and tachypneic with lactic acidosis.  Resuscitated and admitted to ICU then transferred to progressive care unit with stable blood pressures. CT chest without contrast with extensive pneumonia, possible septic emboli.    Also presented with left arm cellulitis which has now resolved.  Ultimately blood cultures growing MSSA bacteremia.  Infectious disease consulted and following.  Infective endocarditis has been ruled out with TEE done on 05/27/2020.  05/28/20: Patient was seen and examined at his bedside.  He is alert, oriented x3, weak appearing.  He denies any centrally located chest pain.  He continues to have significant dyspnea with minimal movement, worsening hypoxemia, currently requiring 6 L to maintain O2 saturation greater than 92%.  Stat ABG and stat chest x-ray ordered, will follow results.  PCCM has been consulted due to worsening clinical picture.  Assessment/Plan: Active Problems:   Endocarditis and heart valve disorders in diseases classified elsewhere   Bacteremia  Sepsis secondary to MSSA bacteremia, POA Unclear source of infection. Sepsis as evidenced by leukocytosis, fever with T-max of 100.8, and MSSA bacteremia. Appears to have suspected bilateral septic pulmonary emboli. TTE and TEE negative for infective endocarditis.   Currently on cefazolin 2 g IV every 8 hours, day #4 Repeat blood cultures taken on 05/25/2020 - to date. Appreciate infectious disease assistance. Management per infectious disease.  Worsening acute  hypoxic respiratory failure secondary to suspected bilateral septic pulmonary emboli D-dimer on presentation greater than 4 Repeat D-dimer on 05/28/2020. Maintain a saturation greater than 90%. Oxygen requirement is increasing from 4 L to 6 L to maintain O2 saturation greater than 92% He is not on oxygen supplementation at baseline. PCCM consulted, appreciate assistance.  Non anion gap metabolic acidosis Serum bicarb 21 Follow ABG Repeat renal panel in the morning.  Suspected bilateral septic pulmonary emboli Management as stated above. Treat underlying condition Continue IV antibiotics  Improving nonoliguric AKI on CKD 3B Presented with creatinine of 2.7 Creatinine downtrending 1.5 from 1.9 with GFR 42 Avoid nephrotoxic agents and hypotension. Continue to monitor urine output Repeat renal panel in the morning.  Resolved hypovolemic hyponatremia Serum sodium 133>> 135 IV fluid discontinued on 05/28/2020.  Resolved hypophosphatemia Encourage oral intake Serum phosphorus 2.4>> 2.5. Replete and repeat level in the morning.  THC use UDS positive for THC on 05/24/2020 Denies use of cocaine or intravenous drug abuse    Code Status: Full code.  Family Communication: Updated his sister via phone on 05/27/2020.  Disposition Plan: Likely will discharge to home once oxygen requirement is improved and infectious disease signs off.   Consultants:  Infectious disease.  PCCM  Procedures:  2D echo on 05/25/2020  TEE on 05/27/2020.  Antimicrobials:  Cefazolin for 05/25/20>>  DVT prophylaxis: Subcu heparin 3 times daily.  Status is: Inpatient    Dispo: The patient is from: Home.              Anticipated d/c is to: Home.              Patient currently not stable for discharge due to acute  hypoxic respiratory failure, management of MSSA bacteremia.   Difficult to place patient: Not applicable.        Objective: Vitals:   05/27/20 2200 05/27/20 2300 05/28/20 0500  05/28/20 0951  BP: 121/84 120/80 131/85 124/73  Pulse:   85 88  Resp: (!) 46  Temp: 99.7 F (37.6 C) 99.7 F (37.6 C) 99.6 F (37.6 C) 98.6 F (37 C)  TempSrc: Oral Oral Oral Oral  SpO2:   96% 100%  Weight:      Height:        Intake/Output Summary (Last 24 hours) at 05/28/2020 1051 Last data filed at 05/28/2020 0500 Gross per 24 hour  Intake --  Output 800 ml  Net -800 ml   Filed Weights   05/24/20 1410  Weight: 68 kg    Exam:  . General: 50 y.o. year-old male weak appearing alert and oriented x3.  . Cardiovascular: Regular rate and rhythm no rubs or gallops. Marland Kitchen Respiratory: Diffuse rales bilaterally no wheezing noted.   . Abdomen: Soft nontender normal bowel sounds present. . Musculoskeletal: No lower extremity edema bilaterally. . Skin: No ulcerative lesions noted . Psychiatry: Mood is appropriate for condition and setting.   Data Reviewed: CBC: Recent Labs  Lab 05/24/20 1417 05/24/20 1512 05/25/20 0218 05/26/20 0304 05/27/20 0244 05/28/20 0255  WBC 6.4  --  3.2* 5.3 10.9* 16.4*  NEUTROABS 5.8  --   --   --   --  12.9*  HGB 16.8 16.7 14.5 13.7 12.7* 12.5*  HCT 48.0 49.0 42.2 39.1 36.1* 35.8*  MCV 95.2  --  95.9 91.6 92.3 92.5  PLT 207  --  168 180 160 PLATELET CLUMPS NOTED ON SMEAR, UNABLE TO ESTIMATE   Basic Metabolic Panel: Recent Labs  Lab 05/24/20 1417 05/24/20 1512 05/25/20 0218 05/26/20 0304 05/27/20 0244 05/28/20 0255  NA 135 131* 134* 133* 133* 135  K 3.9 4.1 4.0 3.7 3.5 3.7  CL 97*  --  102 100 101 102  CO2 25  --  19* 23 25 21*  GLUCOSE 127*  --  124* 107* 116* 111*  BUN 25*  --  34* 42* 46* 48*  CREATININE 2.68*  --  2.79* 2.53* 1.90* 1.50*  CALCIUM 7.8*  --  7.1* 7.5* 7.6* 7.4*  MG  --   --  1.7 2.1 2.8* 3.0*  PHOS  --   --   --  3.5 2.4* 2.5   GFR: Estimated Creatinine Clearance: 56.7 mL/min (A) (by C-G formula based on SCr of 1.5 mg/dL (H)). Liver Function Tests: Recent Labs  Lab 05/24/20 1417  AST 35  ALT 24   ALKPHOS 56  BILITOT 1.2  PROT 6.0*  ALBUMIN 2.6*   No results for input(s): LIPASE, AMYLASE in the last 168 hours. No results for input(s): AMMONIA in the last 168 hours. Coagulation Profile: Recent Labs  Lab 05/24/20 1417  INR 1.1   Cardiac Enzymes: No results for input(s): CKTOTAL, CKMB, CKMBINDEX, TROPONINI in the last 168 hours. BNP (last 3 results) No results for input(s): PROBNP in the last 8760 hours. HbA1C: No results for input(s): HGBA1C in the last 72 hours. CBG: No results for input(s): GLUCAP in the last 168 hours. Lipid Profile: No results for input(s): CHOL, HDL, LDLCALC, TRIG, CHOLHDL, LDLDIRECT in the last 72 hours. Thyroid Function Tests: No results for input(s): TSH, T4TOTAL, FREET4, T3FREE, THYROIDAB in the last 72 hours. Anemia Panel: No results for input(s): VITAMINB12, FOLATE, FERRITIN, TIBC, IRON,  RETICCTPCT in the last 72 hours. Urine analysis:    Component Value Date/Time   COLORURINE YELLOW 05/24/2020 0502   APPEARANCEUR HAZY (A) 05/24/2020 0502   LABSPEC 1.016 05/24/2020 0502   PHURINE 5.0 05/24/2020 0502   GLUCOSEU 50 (A) 05/24/2020 0502   HGBUR MODERATE (A) 05/24/2020 0502   BILIRUBINUR NEGATIVE 05/24/2020 0502   KETONESUR NEGATIVE 05/24/2020 0502   PROTEINUR 100 (A) 05/24/2020 0502   NITRITE NEGATIVE 05/24/2020 0502   LEUKOCYTESUR NEGATIVE 05/24/2020 0502   Sepsis Labs: (procalcitonin:4,lacticidven:4)  ) Recent Results (from the past 240 hour(s))  Blood Culture (routine x 2)     Status: Abnormal   Collection Time: 05/24/20  3:08 PM   Specimen: Left Antecubital; Blood  Result Value Ref Range Status   Specimen Description LEFT ANTECUBITAL  Final   Special Requests   Final    BOTTLES DRAWN AEROBIC AND ANAEROBIC Blood Culture results may not be optimal due to an inadequate volume of blood received in culture bottles   Culture  Setup Time   Final    GRAM POSITIVE COCCI IN CLUSTERS IN BOTH AEROBIC AND ANAEROBIC  BOTTLES CRITICAL RESULT CALLED TO, READ BACK BY AND VERIFIED WITH: PHARMD A WOLF [id number] AT 754 AM BY CM Performed at [facility], [address] [place], [place] [address]    Culture STAPHYLOCOCCUS AUREUS (A)  Final   Report Status 05/27/2020 FINAL  Final   Organism ID, Bacteria STAPHYLOCOCCUS AUREUS  Final      Susceptibility   Staphylococcus aureus - MIC*    CIPROFLOXACIN <=0.5 SENSITIVE Sensitive     ERYTHROMYCIN >=8 RESISTANT Resistant     GENTAMICIN <=0.5 SENSITIVE Sensitive     OXACILLIN 0.5 SENSITIVE Sensitive     TETRACYCLINE <=1 SENSITIVE Sensitive     VANCOMYCIN <=0.5 SENSITIVE Sensitive     TRIMETH/SULFA <=10 SENSITIVE Sensitive     CLINDAMYCIN <=0.25 SENSITIVE Sensitive     RIFAMPIN <=0.5 SENSITIVE Sensitive     Inducible Clindamycin NEGATIVE Sensitive     * STAPHYLOCOCCUS AUREUS  Blood Culture ID Panel (Reflexed)     Status: Abnormal   Collection Time: 05/24/20  3:08 PM  Result Value Ref Range Status   Enterococcus faecalis NOT DETECTED NOT DETECTED Final   Enterococcus Faecium NOT DETECTED NOT DETECTED Final   Listeria monocytogenes NOT DETECTED NOT DETECTED Final   Staphylococcus species DETECTED (A) NOT DETECTED Final    Comment: CRITICAL RESULT CALLED TO, READ BACK BY AND VERIFIED WITH: PHARMD A WOLF [id number] AT 800 BY CM    Staphylococcus aureus (BCID) DETECTED (A) NOT DETECTED Final    Comment: CRITICAL RESULT CALLED TO, READ BACK BY AND VERIFIED WITH: PHARMD A WOLF [id number] AT 800 BY CM    Staphylococcus epidermidis NOT DETECTED NOT DETECTED Final   Staphylococcus lugdunensis NOT DETECTED NOT DETECTED Final   Streptococcus species NOT DETECTED NOT DETECTED Final   Streptococcus agalactiae NOT DETECTED NOT DETECTED Final   Streptococcus pneumoniae NOT DETECTED NOT DETECTED Final   Streptococcus pyogenes NOT DETECTED NOT DETECTED Final   A.calcoaceticus-baumannii NOT DETECTED NOT DETECTED Final   Bacteroides fragilis NOT DETECTED NOT DETECTED Final    Enterobacterales NOT DETECTED NOT DETECTED Final   Enterobacter cloacae complex NOT DETECTED NOT DETECTED Final   Escherichia coli NOT DETECTED NOT DETECTED Final   Klebsiella aerogenes NOT DETECTED NOT DETECTED Final   Klebsiella oxytoca NOT DETECTED NOT DETECTED Final   Klebsiella pneumoniae NOT DETECTED NOT DETECTED Final   Proteus species NOT  DETECTED NOT DETECTED Final   Salmonella species NOT DETECTED NOT DETECTED Final   Serratia marcescens NOT DETECTED NOT DETECTED Final   Haemophilus influenzae NOT DETECTED NOT DETECTED Final   Neisseria meningitidis NOT DETECTED NOT DETECTED Final   Pseudomonas aeruginosa NOT DETECTED NOT DETECTED Final   Stenotrophomonas maltophilia NOT DETECTED NOT DETECTED Final   Candida albicans NOT DETECTED NOT DETECTED Final   Candida auris NOT DETECTED NOT DETECTED Final   Candida glabrata NOT DETECTED NOT DETECTED Final   Candida krusei NOT DETECTED NOT DETECTED Final   Candida parapsilosis NOT DETECTED NOT DETECTED Final   Candida tropicalis NOT DETECTED NOT DETECTED Final   Cryptococcus neoformans/gattii NOT DETECTED NOT DETECTED Final   Meth resistant mecA/C and MREJ NOT DETECTED NOT DETECTED Final    Comment: Performed at [facility], [address] [place], [place] [address]  Resp Panel by RT-PCR (Flu A&B, Covid) Nasopharyngeal Swab     Status: None   Collection Time: 05/24/20  3:14 PM   Specimen: Nasopharyngeal Swab; Nasopharyngeal(NP) swabs in vial transport medium  Result Value Ref Range Status   SARS Coronavirus 2 by RT PCR NEGATIVE NEGATIVE Final    Comment: (NOTE) SARS-CoV-2 target nucleic acids are NOT DETECTED.  The SARS-CoV-2 RNA is generally detectable in upper respiratory specimens during the acute phase of infection. The lowest concentration of SARS-CoV-2 viral copies this assay can detect is 138 copies/mL. A negative result does not preclude SARS-Cov-2 infection and should not be used as the sole basis for treatment  or other patient management decisions. A negative result may occur with  improper specimen collection/handling, submission of specimen other than nasopharyngeal swab, presence of viral mutation(s) within the areas targeted by this assay, and inadequate number of viral copies(<138 copies/mL). A negative result must be combined with clinical observations, patient history, and epidemiological information. The expected result is Negative.  Fact Sheet for Patients:  BloggerCourse.comhttps://www.fda.gov/media/152166/download  Fact Sheet for Healthcare Providers:  SeriousBroker.ithttps://www.fda.gov/media/152162/download  This test is no t yet approved or cleared by the [place] FDA and  has been authorized for detection and/or diagnosis of SARS-CoV-2 by FDA under an Emergency Use Authorization (EUA). This EUA will remain  in effect (meaning this test can be used) for the duration of the COVID-19 declaration under Section 564(b)(1) of the Act, 21 U.S.C.section 360bbb-3(b)(1), unless the authorization is terminated  or revoked sooner.       Influenza A by PCR NEGATIVE NEGATIVE Final   Influenza B by PCR NEGATIVE NEGATIVE Final    Comment: (NOTE) The Xpert Xpress SARS-CoV-2/FLU/RSV plus assay is intended as an aid in the diagnosis of influenza from Nasopharyngeal swab specimens and should not be used as a sole basis for treatment. Nasal washings and aspirates are unacceptable for Xpert Xpress SARS-CoV-2/FLU/RSV testing.  Fact Sheet for Patients: BloggerCourse.comhttps://www.fda.gov/media/152166/download  Fact Sheet for Healthcare Providers: SeriousBroker.ithttps://www.fda.gov/media/152162/download  This test is not yet approved or cleared by the [place] FDA and has been authorized for detection and/or diagnosis of SARS-CoV-2 by FDA under an Emergency Use Authorization (EUA). This EUA will remain in effect (meaning this test can be used) for the duration of the COVID-19 declaration under Section 564(b)(1) of the Act, 21 U.S.C. section  360bbb-3(b)(1), unless the authorization is terminated or revoked.  Performed at [facility], [address] [place], [place] [address]   Blood Culture (routine x 2)     Status: Abnormal   Collection Time: 05/24/20  9:45 PM   Specimen:  BLOOD LEFT HAND  Result Value Ref Range Status   Specimen Description BLOOD LEFT HAND  Final   Special Requests   Final    BOTTLES DRAWN AEROBIC AND ANAEROBIC Blood Culture results may not be optimal due to an inadequate volume of blood received in culture bottles   Culture  Setup Time   Final    GRAM POSITIVE COCCI IN CLUSTERS IN BOTH AEROBIC AND ANAEROBIC BOTTLES CRITICAL VALUE NOTED.  VALUE IS CONSISTENT WITH PREVIOUSLY REPORTED AND CALLED VALUE.    Culture (A)  Final    STAPHYLOCOCCUS AUREUS SUSCEPTIBILITIES PERFORMED ON PREVIOUS CULTURE WITHIN THE LAST 5 DAYS. Performed at [facility], [address] [place], [place] [address]    Report Status 05/27/2020 FINAL  Final  MRSA PCR Screening     Status: None   Collection Time: 05/24/20 11:46 PM   Specimen: Nasopharyngeal  Result Value Ref Range Status   MRSA by PCR NEGATIVE NEGATIVE Final    Comment:        The GeneXpert MRSA Assay (FDA approved for NASAL specimens only), is one component of a comprehensive MRSA colonization surveillance program. It is not intended to diagnose MRSA infection nor to guide or monitor treatment for MRSA infections. Performed at [facility], [address] [place], [place] [address]   Urine culture     Status: None   Collection Time: 05/24/20 11:59 PM   Specimen: In/Out Cath Urine  Result Value Ref Range Status   Specimen Description IN/OUT CATH URINE  Final   Special Requests NONE  Final   Culture   Final    NO GROWTH Performed at [facility], [address] [place], [place] [address]    Report Status 05/26/2020 FINAL  Final  Culture, blood (routine x 2)     Status: None (Preliminary result)   Collection Time: 05/25/20  10:19 PM   Specimen: BLOOD LEFT HAND  Result Value Ref Range Status   Specimen Description BLOOD LEFT HAND  Final   Special Requests   Final    BOTTLES DRAWN AEROBIC AND ANAEROBIC Blood Culture adequate volume   Culture   Final    NO GROWTH 2 DAYS Performed at [facility], [address] [place], [place] [address]    Report Status PENDING  Incomplete  Culture, blood (routine x 2)     Status: None (Preliminary result)   Collection Time: 05/25/20 10:32 PM   Specimen: BLOOD RIGHT HAND  Result Value Ref Range Status   Specimen Description BLOOD RIGHT HAND  Final   Special Requests AEROBIC BOTTLE ONLY Blood Culture adequate volume  Final   Culture   Final    NO GROWTH 2 DAYS Performed at [facility], [address], [place], [place] [address]    Report Status PENDING  Incomplete      Studies: CT HEAD WO CONTRAST  Result Date: 05/27/2020 CLINICAL DATA:  Mental status change, CNS infection suspected EXAM: CT HEAD WITHOUT CONTRAST TECHNIQUE: Contiguous axial images were obtained from the base of the skull through the vertex without intravenous contrast. COMPARISON:  None. FINDINGS: Brain: No evidence of large-territorial acute infarction. No parenchymal hemorrhage. No mass lesion. No extra-axial collection. No mass effect or midline shift. No hydrocephalus. Basilar cisterns are patent. Vascular: No hyperdense vessel. Skull: No acute fracture or focal lesion. Sinuses/Orbits: Paranasal sinuses and mastoid air cells are clear. The orbits are unremarkable. Other: None. IMPRESSION: No acute intracranial abnormality. Electronically  Signed   By: Tish Frederickson M.D.   On: 05/27/2020 19:45   ECHO TEE  Result Date: 05/27/2020    TRANSESOPHOGEAL ECHO REPORT   Patient Name:   Edward Mueller Date of Exam: 05/27/2020 Medical Rec #:  [id number]      Height:       75.0 in Accession #:    [id number]     Weight:       150.0 lb Date of Birth:  [birth year]      BSA:          1.942 m Patient Age:    49  years       BP:           106/59 mmHg Patient Gender: M              HR:           104 bpm. Exam Location:  Inpatient Procedure: Transesophageal Echo, Color Doppler and Cardiac Doppler Indications:     Bacteremia R78.81  History:         Patient has prior history of Echocardiogram examinations, most                  recent 05/25/2020.  Sonographer:     Lavenia Atlas Referring Phys:  [id number] Dorcas Carrow Diagnosing Phys: Thurmon Fair MD PROCEDURE: After discussion of the risks and benefits of a TEE, an informed consent was obtained. The transesophogeal probe was passed without difficulty through the esophogus of the patient. Sedation performed by different physician. The patient was monitored while under deep sedation. Anesthestetic sedation was provided intravenously by Anesthesiology: 234.  of Propofol. The patient's vital signs; including heart rate, blood pressure, and oxygen saturation; remained stable throughout the procedure. The patient developed no complications during the procedure. IMPRESSIONS  1. Left ventricular ejection fraction, by estimation, is 60 to 65%. The left ventricle has normal function. The left ventricle has no regional wall motion abnormalities.  2. Right ventricular systolic function is normal. The right ventricular size is normal.  3. No left atrial/left atrial appendage thrombus was detected.  4. The mitral valve is normal in structure. Trivial mitral valve regurgitation. No evidence of mitral stenosis.  5. The aortic valve is normal in structure. Aortic valve regurgitation is not visualized. No aortic stenosis is present.  6. The inferior vena cava is normal in size with greater than 50% respiratory variability, suggesting right atrial pressure of 3 mmHg. Conclusion(s)/Recommendation(s): Normal biventricular function without evidence of hemodynamically significant valvular heart disease. No evidence of vegetation/infective endocarditis on this transesophageal echocardiogram.  FINDINGS  Left Ventricle: Left ventricular ejection fraction, by estimation, is 60 to 65%. The left ventricle has normal function. The left ventricle has no regional wall motion abnormalities. The left ventricular internal cavity size was normal in size. There is  no left ventricular hypertrophy. Right Ventricle: The right ventricular size is normal. No increase in right ventricular wall thickness. Right ventricular systolic function is normal. Left Atrium: Left atrial size was normal in size. No left atrial/left atrial appendage thrombus was detected. Right Atrium: Right atrial size was normal in size. Pericardium: There is no evidence of pericardial effusion. Mitral Valve: The mitral valve is normal in structure. Trivial mitral valve regurgitation. No evidence of mitral valve stenosis. Tricuspid Valve: The tricuspid valve is normal in structure. Tricuspid valve regurgitation is not demonstrated. No evidence of tricuspid stenosis. Aortic Valve: The aortic valve is normal in structure. Aortic valve regurgitation is not visualized. No aortic stenosis  is present. Pulmonic Valve: The pulmonic valve was normal in structure. Pulmonic valve regurgitation is not visualized. No evidence of pulmonic stenosis. Aorta: The aortic root is normal in size and structure. Venous: The inferior vena cava is normal in size with greater than 50% respiratory variability, suggesting right atrial pressure of 3 mmHg. IAS/Shunts: No atrial level shunt detected by color flow Doppler. Mihai Croitoru MD Electronically signed by Thurmon Fair MD Signature Date/Time: 05/27/2020/5:10:51 PM    Final     Scheduled Meds: . fentaNYL (SUBLIMAZE) injection  50 mcg Intravenous Once  . heparin  5,000 Units Subcutaneous Q8H    Continuous Infusions: .  ceFAZolin (ANCEF) IV 2 g (05/28/20 0624)     LOS: 4 days     Darlin Drop, MD [facility] Pager [phone]  If 7PM-7AM, please contact night-coverage [email or web address] Password  [facility] 05/28/2020, 10:51 AM

## 2020-05-28 NOTE — Evaluation (Signed)
Occupational Therapy Evaluation Patient Details Name: Edward Mueller MRN: [id number] DOB: 1970/10/05 Today's Date: 05/28/2020    History of Present Illness Pt is a 50 y.o. 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 Chest CT shows multiple peripheral infiltrates consistent with embolic phenomena; concern for endocarditis. Workup for compensated septic shock due to LUE cellulitis, MSSA bactermia of unclear source.  S/p TEE 4/6 with no evidence of endocarditis. UDS (+) THC. No reported PMH; pt reports donating plasma 2x/wk.   Clinical Impression   Pt admitted to the ED for the above mentioned concerns. PTA pt reported being independent with all ADL's and IADL's, using no DME. At the time of the evaluation pt demonstrated continued independence. Pt is limited by increased breath rate and anxiety, however is still able to complete all ADL's and functional mobility with mod I. Pt does not need skilled OT services at this time and will be discharged from acute OT.    Follow Up Recommendations  No OT follow up    Equipment Recommendations  None recommended by OT    Recommendations for Other Services       Precautions / Restrictions Precautions Precautions: Fall;Other (comment) Precaution Comments: SOB and anxious with minimal activity Restrictions Weight Bearing Restrictions: No      Mobility Bed Mobility Overal bed mobility: Modified Independent Bed Mobility: Supine to Sit     Supine to sit: Mod assist;HOB elevated     General bed mobility comments: Pt in recliner at start of session, pt laid down in bed at end of session using railings to assist with lying in supine.    Transfers Overall transfer level: Independent Equipment used: None Transfers: Sit to/from Stand Sit to Stand: Modified independent (Device/Increase time)         General transfer comment: Pt  required additional time to ambulate in room due to anxiety and increased breathing rate.    Balance Overall balance assessment: Independent   Sitting balance-Leahy Scale: Good       Standing balance-Leahy Scale: Good                             ADL either performed or assessed with clinical judgement   ADL Overall ADL's : Modified independent                                       General ADL Comments: Pt able to complete grooming in standing, toileting, and bathing/dressing in sitting w/ mod I.     Vision Baseline Vision/History: No visual deficits Patient Visual Report: No change from baseline Vision Assessment?: No apparent visual deficits     Perception Perception Perception Tested?: No   Praxis Praxis Praxis tested?: Not tested    Pertinent Vitals/Pain Pain Assessment: No/denies pain Faces Pain Scale: Hurts a little bit Pain Location: Chest with coughing Pain Descriptors / Indicators: Guarding;Grimacing Pain Intervention(s): Monitored during session;Repositioned     Hand Dominance Right   Extremity/Trunk Assessment Upper Extremity Assessment Upper Extremity Assessment: Overall WFL for tasks assessed   Lower Extremity Assessment Lower Extremity Assessment: Defer to PT evaluation   Cervical / Trunk Assessment Cervical / Trunk Assessment: Normal   Communication Communication Communication: No difficulties   Cognition Arousal/Alertness: Awake/alert Behavior During Therapy: Flat affect;Anxious Overall Cognitive Status: Within Functional Limits  for tasks assessed                                 General Comments: Appears anxious regarding SOB; following commands and answering questions appropriately; requires encouragement to perform tasks as independently as possible   General Comments  SpO2 97-100% on 3L O2 [place] with bed-level activity (donning socks); SpO2 down to 72% on 2L with ambulation to bathroom (unsure  reliable reading), up to 89% when increased to 4L    Exercises     Shoulder Instructions      Home Living Family/patient expects to be discharged to:: Private residence Living Arrangements: Parent Available Help at Discharge: Family;Available PRN/intermittently Type of Home: House Home Access: Stairs to enter [employer] of Steps: 3 Entrance Stairs-Rails: Right Home Layout: One level     Bathroom Shower/Tub: [employer]: Standard Bathroom Accessibility: Yes How Accessible: Accessible via walker Home Equipment: None   Additional Comments: Lives with mother      Prior Functioning/Environment Level of Independence: Independent        Comments: Works in housekeeping; enjoys walking for exercise. Drives        OT Problem List: Decreased strength;Decreased activity tolerance;Decreased safety awareness      OT Treatment/Interventions:      OT Goals(Current goals can be found in the care plan section) Acute Rehab OT Goals Patient Stated Goal: Breathe better OT Goal Formulation: With patient  OT Frequency:     Barriers to D/C:            Co-evaluation              AM-PAC OT "6 Clicks" Daily Activity     Outcome Measure Help from another person eating meals?: None Help from another person taking care of personal grooming?: None Help from another person toileting, which includes using toliet, bedpan, or urinal?: None Help from another person bathing (including washing, rinsing, drying)?: None Help from another person to put on and taking off regular upper body clothing?: None Help from another person to put on and taking off regular lower body clothing?: None 6 Click Score: 24   End of Session Equipment Utilized During Treatment: Gait belt;Oxygen Nurse Communication: Mobility status;Other (comment) (O2 sats)  Activity Tolerance: Patient tolerated treatment well;Other (comment) (Pt demonstrated increased anxiety and  increased breath rate with mobility.) Patient left: in bed;with call bell/phone within reach  OT Visit Diagnosis: Unsteadiness on feet (R26.81);Muscle weakness (generalized) (M62.81)                Time: 0454-0981 OT Time Calculation (min): 23 min Charges:  OT General Charges $OT Visit: 1 Visit OT Evaluation $OT Eval Low Complexity: 1 Low OT Treatments $Self Care/Home Management : 8-22 mins  Noach Calvillo H., OTR/L Acute Rehabilitation  Daira Hine Elane Bing Plume 05/28/2020, 12:55 PM

## 2020-05-28 NOTE — Evaluation (Signed)
Physical Therapy Evaluation Patient Details Name: Edward Mueller MRN: [id number] DOB: 1970-11-25 Today's Date: 05/28/2020   History of Present Illness  Pt is a 50 y.o. 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 Chest CT shows multiple peripheral infiltrates consistent with embolic phenomena; concern for endocarditis. Workup for compensated septic shock due to LUE cellulitis, MSSA bactermia of unclear source.  S/p TEE 4/6 with no evidence of endocarditis. UDS (+) THC. No reported PMH; pt reports donating plasma 2x/wk.    Clinical Impression  Pt presents with an overall decrease in functional mobility secondary to above. PTA, pt independent, lives with mother, works active job, enjoys walking for exercise. Today, pt demonstrates good strength with mobility, although limited by significant SOB with associated anxiety, requiring increased time and multiple rest breaks to complete tasks. Pt with rapid, shallow breathing requiring frequent cues for pursed lip breathing technique. Pt would benefit from continued acute PT services to maximize functional mobility and independence prior to d/c home.  SpO2 97-100% on 3L at rest Down to 72% on 2L O2 [place] with activity (unsure reliable reading) Quickly up to 89% when increased to 4L with activity HR 90s-100s    Follow Up Recommendations No PT follow up    Equipment Recommendations  None recommended by PT    Recommendations for Other Services       Precautions / Restrictions Precautions Precautions: Fall;Other (comment) Precaution Comments: SOB and anxious with minimal activity Restrictions Weight Bearing Restrictions: No      Mobility  Bed Mobility Overal bed mobility: Needs Assistance Bed Mobility: Supine to Sit     Supine to sit: Mod assist;HOB elevated     General bed mobility comments: ModA for HHA to elevate trunk; pt  appears anxious regarding significant DOE with minimal activity; could likely have performed with less assist    Transfers Overall transfer level: Needs assistance Equipment used: 1 person hand held assist;None Transfers: Sit to/from Stand Sit to Stand: Min assist;Supervision         General transfer comment: Initial minA for HHA to elevate trunk; pt able to sit<>stand from toilet with supervision  Ambulation/Gait Ambulation/Gait assistance: Min guard Gait Distance (Feet): 40 Feet Assistive device: None Gait Pattern/deviations: Step-through pattern;Decreased stride length Gait velocity: Decreased   General Gait Details: Slow, mostly steady ambulation in room with min guard for balance; pt with DOE 3/4, urgency to use bathroom immediately upon standing; declined further distance secondary to fatigue and SOB  Stairs            Wheelchair Mobility    Modified Rankin (Stroke Patients Only)       Balance Overall balance assessment: Needs assistance   Sitting balance-Leahy Scale: Fair       Standing balance-Leahy Scale: Fair                               Pertinent Vitals/Pain Pain Assessment: Faces Faces Pain Scale: Hurts a little bit Pain Location: Chest with coughing Pain Descriptors / Indicators: Guarding;Grimacing Pain Intervention(s): Monitored during session    Home Living Family/patient expects to be discharged to:: Private residence Living Arrangements: Parent Available Help at Discharge: Family;Available PRN/intermittently Type of Home: House Home Access: Stairs to enter Entrance Stairs-Rails: Right Entrance Stairs-Number of Steps: 3 Home Layout: One level Home Equipment: None Additional Comments: Lives with mother    Prior Function Level  of Independence: Independent         Comments: Works in housekeeping; enjoys walking for exercise. Drives     Hand Dominance        Extremity/Trunk Assessment   Upper Extremity  Assessment Upper Extremity Assessment: Overall WFL for tasks assessed    Lower Extremity Assessment Lower Extremity Assessment: Overall WFL for tasks assessed       Communication   Communication: No difficulties  Cognition Arousal/Alertness: Awake/alert Behavior During Therapy: Flat affect;Anxious Overall Cognitive Status: Within Functional Limits for tasks assessed                                 General Comments: Appears anxious regarding SOB; following commands and answering questions appropriately; requires encouragement to perform tasks as independently as possible      General Comments General comments (skin integrity, edema, etc.): SpO2 97-100% on 3L O2 [place] with bed-level activity (donning socks); SpO2 down to 72% on 2L with ambulation to bathroom (unsure reliable reading), up to 89% when increased to 4L    Exercises     Assessment/Plan    PT Assessment Patient needs continued PT services  PT Problem List Decreased activity tolerance;Decreased mobility;Cardiopulmonary status limiting activity       PT Treatment Interventions DME instruction;Gait training;Stair training;Functional mobility training;Therapeutic activities;Therapeutic exercise;Balance training;Patient/family education    PT Goals (Current goals can be found in the Care Plan section)  Acute Rehab PT Goals Patient Stated Goal: Breathe better PT Goal Formulation: With patient Time For Goal Achievement: 06/11/20 Potential to Achieve Goals: Good    Frequency Min 3X/week   Barriers to discharge        Co-evaluation               AM-PAC PT "6 Clicks" Mobility  Outcome Measure Help needed turning from your back to your side while in a flat bed without using bedrails?: None Help needed moving from lying on your back to sitting on the side of a flat bed without using bedrails?: A Lot Help needed moving to and from a bed to a chair (including a wheelchair)?: A Little Help needed  standing up from a chair using your arms (e.g., wheelchair or bedside chair)?: A Little Help needed to walk in hospital room?: A Little Help needed climbing 3-5 steps with a railing? : A Little 6 Click Score: 18    End of Session Equipment Utilized During Treatment: Oxygen Activity Tolerance: Patient limited by fatigue;Treatment limited secondary to medical complications (Comment) (anxiety regarding SOB) Patient left: in chair;with call bell/phone within reach Nurse Communication: Mobility status (RN ok with no chair alarm) PT Visit Diagnosis: Other abnormalities of gait and mobility (R26.89);Other (comment) (impaired cardiorespiratory status)    Time: 8676-7209 PT Time Calculation (min) (ACUTE ONLY): 30 min   Charges:   PT Evaluation $PT Eval Moderate Complexity: 1 Mod PT Treatments $Therapeutic Activity: 8-22 mins       Ina Homes, PT, DPT Acute Rehabilitation Services  Pager [phone] Office [phone]  Malachy Chamber 05/28/2020, 9:54 AM

## 2020-05-28 NOTE — Progress Notes (Signed)
[facility]  Date of Admission:  05/24/2020     Total days of antibiotics 5         ASSESSMENT:  Mr. Fragoso blood cultures from 4/4 remain without growth to date. TEE was without evidence of endocarditis. Left antecubital cellulitis continues to improve. Having continued issues with work of breathing and being easily fatigable with activity. Supplemental oxygen need has continued to increase slowly and now with leukocytosis with WBC count of 15.4. CCM evaluated with no indication for intubation at present. Continue current dose of Cefazolin. Okay for PICC line if blood cultures remain negative.   PLAN:  1. Continue Cefazolin.  2. Monitor cultures for clearance of bacteremia.  3. Respiratory status per CCM recommendations. 4. PICC line if cultures remain without growth.   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 acetaminophen  650 mg Oral Q6H  . guaiFENesin  400 mg Oral BID  . heparin  5,000 Units Subcutaneous Q8H    SUBJECTIVE:  Max temperature of 100.8 over the last 24 hours with no acute events. Shortness of breath with exertion and activity. Working with Physical Therapy and have to take breaks. Denies fevers.   No Known Allergies   Review of Systems: Review of Systems  Constitutional: Negative for chills, fever and weight loss.  Respiratory: Positive for cough and shortness of breath. Negative for wheezing.   Cardiovascular: Negative for chest pain and leg swelling.  Gastrointestinal: Negative for abdominal pain, constipation, diarrhea, nausea and vomiting.  Skin: Negative for rash.      OBJECTIVE: Vitals:   05/27/20 2300 05/28/20 0500 05/28/20 0951 05/28/20 1200  BP: 120/80 131/85 124/73 129/83  Pulse:  85 88 93  Resp: 20 20 (!) 46 (!) 46  Temp: 99.7 F (37.6 C) 99.6 F (37.6 C) 98.6 F (37 C) 99 F (37.2 C)   TempSrc: Oral Oral Oral Oral  SpO2:  96% 100% 97%  Weight:      Height:       Body mass index is 18.75 kg/m.  Physical Exam Constitutional:      General: He is not in acute distress.    Appearance: He is well-developed.     Interventions: Nasal cannula in place.  Cardiovascular:     Rate and Rhythm: Normal rate and regular rhythm.     Heart sounds: Normal heart sounds.  Pulmonary:     Breath sounds: Normal breath sounds.  Skin:    General: Skin is warm and dry.  Neurological:     Mental Status: He is alert and oriented to person, place, and time.  Psychiatric:        Mood and Affect: Mood is anxious.        Thought Content: Thought content normal.        Judgment: Judgment normal.     Lab Results Lab Results  Component Value Date   WBC 16.4 (H) 05/28/2020   HGB 12.5 (L) 05/28/2020   HCT 35.8 (L) 05/28/2020   MCV 92.5 05/28/2020   PLT PLATELET CLUMPS NOTED ON SMEAR, UNABLE TO ESTIMATE 05/28/2020    Lab Results  Component Value Date   CREATININE 1.50 (H) 05/28/2020   BUN 48 (H) 05/28/2020   NA 135 05/28/2020   K 3.7 05/28/2020   CL 102 05/28/2020   CO2 21 (L) 05/28/2020    Lab Results  Component Value Date   ALT 24 05/24/2020   AST 35 05/24/2020   ALKPHOS 56 05/24/2020   BILITOT 1.2 05/24/2020     Microbiology: Recent Results (from the past 240 hour(s))  Blood Culture (routine x 2)     Status: Abnormal   Collection Time: 05/24/20  3:08 PM   Specimen: Left Antecubital; Blood  Result Value Ref Range Status   Specimen Description LEFT ANTECUBITAL  Final   Special Requests   Final    BOTTLES DRAWN AEROBIC AND ANAEROBIC Blood Culture results may not be optimal due to an inadequate volume of blood received in culture bottles   Culture  Setup Time   Final    GRAM POSITIVE COCCI IN CLUSTERS IN BOTH AEROBIC AND ANAEROBIC BOTTLES CRITICAL RESULT CALLED TO, READ BACK BY AND VERIFIED WITH: PHARMD A WOLF [id number] AT 754 AM BY CM Performed at [facility]  Lab, [address], [place], [place] [address]    Culture STAPHYLOCOCCUS AUREUS (A)  Final   Report Status 05/27/2020 FINAL  Final   Organism ID, Bacteria STAPHYLOCOCCUS AUREUS  Final      Susceptibility   Staphylococcus aureus - MIC*    CIPROFLOXACIN <=0.5 SENSITIVE Sensitive     ERYTHROMYCIN >=8 RESISTANT Resistant     GENTAMICIN <=0.5 SENSITIVE Sensitive     OXACILLIN 0.5 SENSITIVE Sensitive     TETRACYCLINE <=1 SENSITIVE Sensitive     VANCOMYCIN <=0.5 SENSITIVE Sensitive     TRIMETH/SULFA <=10 SENSITIVE Sensitive     CLINDAMYCIN <=0.25 SENSITIVE Sensitive     RIFAMPIN <=0.5 SENSITIVE Sensitive     Inducible Clindamycin NEGATIVE Sensitive     * STAPHYLOCOCCUS AUREUS  Blood Culture ID Panel (Reflexed)     Status: Abnormal   Collection Time: 05/24/20  3:08 PM  Result Value Ref Range Status   Enterococcus faecalis NOT DETECTED NOT DETECTED Final   Enterococcus Faecium NOT DETECTED NOT DETECTED Final   Listeria monocytogenes NOT DETECTED NOT DETECTED Final   Staphylococcus species DETECTED (A) NOT DETECTED Final    Comment: CRITICAL RESULT CALLED TO, READ BACK BY AND VERIFIED WITH: PHARMD A WOLF [id number] AT 800 BY CM    Staphylococcus aureus (BCID) DETECTED (A) NOT DETECTED Final    Comment: CRITICAL RESULT CALLED TO, READ BACK BY AND VERIFIED WITH: PHARMD A WOLF [id number] AT 800 BY CM    Staphylococcus epidermidis NOT DETECTED NOT DETECTED Final   Staphylococcus lugdunensis NOT DETECTED NOT DETECTED Final   Streptococcus species NOT DETECTED NOT DETECTED Final   Streptococcus agalactiae NOT DETECTED NOT DETECTED Final   Streptococcus pneumoniae NOT DETECTED NOT DETECTED Final   Streptococcus pyogenes NOT DETECTED NOT DETECTED Final   A.calcoaceticus-baumannii NOT DETECTED NOT DETECTED Final   Bacteroides fragilis NOT DETECTED NOT DETECTED Final   Enterobacterales NOT DETECTED NOT DETECTED Final   Enterobacter cloacae complex NOT DETECTED NOT DETECTED Final   Escherichia coli NOT  DETECTED NOT DETECTED Final   Klebsiella aerogenes NOT DETECTED NOT DETECTED Final   Klebsiella oxytoca NOT DETECTED NOT DETECTED Final   Klebsiella pneumoniae NOT DETECTED NOT DETECTED Final   Proteus species NOT DETECTED NOT DETECTED Final   Salmonella species NOT DETECTED NOT DETECTED Final   Serratia marcescens NOT DETECTED NOT DETECTED Final   Haemophilus influenzae NOT DETECTED NOT DETECTED Final   Neisseria meningitidis NOT DETECTED NOT DETECTED Final   Pseudomonas aeruginosa NOT DETECTED NOT DETECTED Final   Stenotrophomonas maltophilia NOT DETECTED NOT DETECTED Final   Candida albicans NOT  DETECTED NOT DETECTED Final   Candida auris NOT DETECTED NOT DETECTED Final   Candida glabrata NOT DETECTED NOT DETECTED Final   Candida krusei NOT DETECTED NOT DETECTED Final   Candida parapsilosis NOT DETECTED NOT DETECTED Final   Candida tropicalis NOT DETECTED NOT DETECTED Final   Cryptococcus neoformans/gattii NOT DETECTED NOT DETECTED Final   Meth resistant mecA/C and MREJ NOT DETECTED NOT DETECTED Final    Comment: Performed at [facility], [address], [place], [place] [address]  Resp Panel by RT-PCR (Flu A&B, Covid) Nasopharyngeal Swab     Status: None   Collection Time: 05/24/20  3:14 PM   Specimen: Nasopharyngeal Swab; Nasopharyngeal(NP) swabs in vial transport medium  Result Value Ref Range Status   SARS Coronavirus 2 by RT PCR NEGATIVE NEGATIVE Final    Comment: (NOTE) SARS-CoV-2 target nucleic acids are NOT DETECTED.  The SARS-CoV-2 RNA is generally detectable in upper respiratory specimens during the acute phase of infection. The lowest concentration of SARS-CoV-2 viral copies this assay can detect is 138 copies/mL. A negative result does not preclude SARS-Cov-2 infection and should not be used as the sole basis for treatment or other patient management decisions. A negative result may occur with  improper specimen collection/handling, submission of specimen  other than nasopharyngeal swab, presence of viral mutation(s) within the areas targeted by this assay, and inadequate number of viral copies(<138 copies/mL). A negative result must be combined with clinical observations, patient history, and epidemiological information. The expected result is Negative.  Fact Sheet for Patients:  BloggerCourse.com  Fact Sheet for Healthcare Providers:  SeriousBroker.it  This test is no t yet approved or cleared by the [place] FDA and  has been authorized for detection and/or diagnosis of SARS-CoV-2 by FDA under an Emergency Use Authorization (EUA). This EUA will remain  in effect (meaning this test can be used) for the duration of the COVID-19 declaration under Section 564(b)(1) of the Act, 21 U.S.C.section 360bbb-3(b)(1), unless the authorization is terminated  or revoked sooner.       Influenza A by PCR NEGATIVE NEGATIVE Final   Influenza B by PCR NEGATIVE NEGATIVE Final    Comment: (NOTE) The Xpert Xpress SARS-CoV-2/FLU/RSV plus assay is intended as an aid in the diagnosis of influenza from Nasopharyngeal swab specimens and should not be used as a sole basis for treatment. Nasal washings and aspirates are unacceptable for Xpert Xpress SARS-CoV-2/FLU/RSV testing.  Fact Sheet for Patients: BloggerCourse.com  Fact Sheet for Healthcare Providers: SeriousBroker.it  This test is not yet approved or cleared by the [place] FDA and has been authorized for detection and/or diagnosis of SARS-CoV-2 by FDA under an Emergency Use Authorization (EUA). This EUA will remain in effect (meaning this test can be used) for the duration of the COVID-19 declaration under Section 564(b)(1) of the Act, 21 U.S.C. section 360bbb-3(b)(1), unless the authorization is terminated or revoked.  Performed at [facility], [address], [place],  [place] [address]   Blood Culture (routine x 2)     Status: Abnormal   Collection Time: 05/24/20  9:45 PM   Specimen: BLOOD LEFT HAND  Result Value Ref Range Status   Specimen Description BLOOD LEFT HAND  Final   Special Requests   Final    BOTTLES DRAWN AEROBIC AND ANAEROBIC Blood Culture results may not be optimal due to an inadequate volume of blood received in culture bottles   Culture  Setup Time   Final  GRAM POSITIVE COCCI IN CLUSTERS IN BOTH AEROBIC AND ANAEROBIC BOTTLES CRITICAL VALUE NOTED.  VALUE IS CONSISTENT WITH PREVIOUSLY REPORTED AND CALLED VALUE.    Culture (A)  Final    STAPHYLOCOCCUS AUREUS SUSCEPTIBILITIES PERFORMED ON PREVIOUS CULTURE WITHIN THE LAST 5 DAYS. Performed at [facility], [address] [place], [place] [address]    Report Status 05/27/2020 FINAL  Final  MRSA PCR Screening     Status: None   Collection Time: 05/24/20 11:46 PM   Specimen: Nasopharyngeal  Result Value Ref Range Status   MRSA by PCR NEGATIVE NEGATIVE Final    Comment:        The GeneXpert MRSA Assay (FDA approved for NASAL specimens only), is one component of a comprehensive MRSA colonization surveillance program. It is not intended to diagnose MRSA infection nor to guide or monitor treatment for MRSA infections. Performed at [facility], [address] [place], [place] [address]   Urine culture     Status: None   Collection Time: 05/24/20 11:59 PM   Specimen: In/Out Cath Urine  Result Value Ref Range Status   Specimen Description IN/OUT CATH URINE  Final   Special Requests NONE  Final   Culture   Final    NO GROWTH Performed at [facility], [address] [place], [place] [address]    Report Status 05/26/2020 FINAL  Final  Culture, blood (routine x 2)     Status: None (Preliminary result)   Collection Time: 05/25/20 10:19 PM   Specimen: BLOOD LEFT HAND  Result Value Ref Range Status   Specimen Description BLOOD LEFT HAND  Final   Special Requests    Final    BOTTLES DRAWN AEROBIC AND ANAEROBIC Blood Culture adequate volume   Culture   Final    NO GROWTH 3 DAYS Performed at [facility], [address] [place], [place] [address]    Report Status PENDING  Incomplete  Culture, blood (routine x 2)     Status: None (Preliminary result)   Collection Time: 05/25/20 10:32 PM   Specimen: BLOOD RIGHT HAND  Result Value Ref Range Status   Specimen Description BLOOD RIGHT HAND  Final   Special Requests AEROBIC BOTTLE ONLY Blood Culture adequate volume  Final   Culture   Final    NO GROWTH 3 DAYS Performed at [facility], [address] [place], [place] [address]    Report Status PENDING  Incomplete     Marcos EkeGreg Chinita Schimpf, NP [facility]  05/28/2020  2:55 PM

## 2020-05-28 NOTE — Progress Notes (Signed)
This is a progress note  NAME:  Edward Mueller, MRN:  [id number], DOB:  1971-02-08, LOS: 4 ADMISSION DATE:  05/24/2020, CONSULTATION DATE:  05/24/2020 REFERRING MD: Lockie Mola , CHIEF COMPLAINT: Dyspnea  History of Present Illness:  50 year old man who presented to [facility] on 4/3 with c/o dyspnea.    Reportedly was in his usual state of health until 48 hours prior to admit. 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  On 4/1 he donated plasma.  After that he woke with a "knot on his arm" and felt poorly.  He had malaise, N/V and SOB with associated central chest pain.  He began coughing up bloody sputum.  He was found to have sepsis in the setting of left arm cellulitis, extensive PNA, MSSA bacteremia & AKI.  TEE negative for endocarditis.   Pertinent  Medical History  Tobacco / THC Use   Significant Hospital Events: Including procedures, antibiotic start and stop dates in addition to other pertinent events   . 4/3 Admit with dyspnea, febrile with elevated lactate, imaging consistent with PNA, L arm cellulitis. Negative HIV, COVID, Flu. Cultures positive for MSSA.  Marland Kitchen 4/4 To [facility]  . 4/6 TEE negative for vegetation  . 4/7 PCCM called back for increased O2 needs. O2 weaned to 3L on exam, sats 97%. VBG 7.4 / 42  Interim History / Subjective:  Pt reports he hurts when he coughs / attempts to splint, reports blood mixed in sputum with coughing  Afebrile  O2 at 5L (reduced to 3L on entry to room, sats remained 97%)  Objective   Blood pressure 124/73, pulse 88, temperature 98.6 F (37 C), temperature source Oral, resp. rate (!) 46, height 6\' 3"  (1.905 m), weight 68 kg, SpO2 100 %.        Intake/Output Summary (Last 24 hours) at 05/28/2020 1057 Last data filed at 05/28/2020 0500 Gross per 24 hour  Intake --  Output 800 ml  Net -800 ml   Filed Weights   05/24/20 1410  Weight: 68 kg    Examination: General: thin adult male lying in bed  watching TV HEENT: MM pink/dry, anicteric, [place] O2 Neuro: AAOx4, speech clear, MAE CV: s1s2 RRR, SR on monitor, no m/r/g PULM: tachypnea but no accessory muscle use, lungs bilaterally diminished  GI: soft, bsx4 active  Extremities: warm/dry, no LE edema  Skin: no rashes or lesions       Resolved Hospital Problem list   Septic Shock   Assessment & Plan:   Sepsis secondary to L Arm Cellulitis, MSSA Bacteremia with associated PNA TEE negative for vegetation  -appreciate ID  -continue cefazolin   Acute Hypoxemic Respiratory Failure secondary to MSSA PNA  Tobacco / THC Use  May have some degree of undiagnosed underlying obstructive disease given smoking history.   -wean O2 for sats >90% -anticipate he will have slow recover given extent of infiltrates -pulmonary hygiene - IS / flutter valve as able  -pain control to allow for adequate pulmonary hygiene > scheduled tylenol for 48 hours  -follow intermittent CXR  -may need bipap support to splint respiratory muscles to avoid fatigue  -no acute intubation need at this time -VBG reviewed, pH / pCO2 wnl -guaifenesin for secretions  AKI in setting of Sepsis  Resolved shock  -Trend BMP / urinary output -Replace electrolytes as indicated -Avoid nephrotoxic agents, ensure adequate renal perfusion   LUE Cellulitis with Superficial DVT -  monitor    Best practice (right click and "Reselect all SmartList Selections" daily)  Diet:  Oral Pain/Anxiety/Delirium protocol (if indicated): No oral pain meds only VAP protocol (if indicated): Not indicated DVT prophylaxis: LMWH GI prophylaxis: N/A Glucose control:  SSI No Central venous access:  N/A Arterial line:  N/A Foley:  N/A Mobility:  OOB  PT consulted: N/A Last date of multidisciplinary goals of care discussion [n/a] Code Status:  full code Disposition: PCU    Canary Brim, MSN, APRN, NP-C, AGACNP-BC [facility] 05/28/2020, 12:12 PM   Please see  [email or web address] for pager details.   From 7A-7P if no response, please call [phone] After hours, please call ELink [phone]

## 2020-05-29 ENCOUNTER — Encounter (HOSPITAL_COMMUNITY): Payer: Self-pay | Admitting: Pulmonary Disease

## 2020-05-29 DIAGNOSIS — R0603 Acute respiratory distress: Secondary | ICD-10-CM

## 2020-05-29 LAB — PROCALCITONIN: Procalcitonin: 20.43 ng/mL

## 2020-05-29 LAB — CBC
HCT: 33.8 % — ABNORMAL LOW (ref 39.0–52.0)
Hemoglobin: 11.9 g/dL — ABNORMAL LOW (ref 13.0–17.0)
MCH: 32.6 pg (ref 26.0–34.0)
MCHC: 35.2 g/dL (ref 30.0–36.0)
MCV: 92.6 fL (ref 80.0–100.0)
Platelets: 140 10*3/uL — ABNORMAL LOW (ref 150–400)
RBC: 3.65 MIL/uL — ABNORMAL LOW (ref 4.22–5.81)
RDW: 13.3 % (ref 11.5–15.5)
WBC: 25 10*3/uL — ABNORMAL HIGH (ref 4.0–10.5)
nRBC: 0 % (ref 0.0–0.2)

## 2020-05-29 LAB — BASIC METABOLIC PANEL
Anion gap: 7 (ref 5–15)
BUN: 44 mg/dL — ABNORMAL HIGH (ref 6–20)
CO2: 26 mmol/L (ref 22–32)
Calcium: 7.4 mg/dL — ABNORMAL LOW (ref 8.9–10.3)
Chloride: 101 mmol/L (ref 98–111)
Creatinine, Ser: 1.51 mg/dL — ABNORMAL HIGH (ref 0.61–1.24)
GFR, Estimated: 56 mL/min — ABNORMAL LOW (ref >60)
Glucose, Bld: 122 mg/dL — ABNORMAL HIGH (ref 70–99)
Potassium: 3.4 mmol/L — ABNORMAL LOW (ref 3.5–5.1)
Sodium: 134 mmol/L — ABNORMAL LOW (ref 135–145)

## 2020-05-29 LAB — EXPECTORATED SPUTUM ASSESSMENT W GRAM STAIN, RFLX TO RESP C: Special Requests: NORMAL

## 2020-05-29 LAB — PHOSPHORUS: Phosphorus: 2.5 mg/dL (ref 2.5–4.6)

## 2020-05-29 LAB — BRAIN NATRIURETIC PEPTIDE: B Natriuretic Peptide: 140.4 pg/mL — ABNORMAL HIGH (ref 0.0–100.0)

## 2020-05-29 LAB — D-DIMER, QUANTITATIVE: D-Dimer, Quant: 3.31 ug/mL-FEU — ABNORMAL HIGH (ref 0.00–0.50)

## 2020-05-29 LAB — MAGNESIUM: Magnesium: 2.8 mg/dL — ABNORMAL HIGH (ref 1.7–2.4)

## 2020-05-29 MED ORDER — OXYCODONE-ACETAMINOPHEN 5-325 MG PO TABS
1.0000 | ORAL_TABLET | Freq: Four times a day (QID) | ORAL | Status: DC | PRN
  Administered 2020-05-30 (×2): 1 via ORAL
  Filled 2020-05-29 (×3): qty 1

## 2020-05-29 MED ORDER — POTASSIUM CHLORIDE CRYS ER 20 MEQ PO TBCR
40.0000 meq | EXTENDED_RELEASE_TABLET | Freq: Once | ORAL | Status: AC
  Administered 2020-05-29: 40 meq via ORAL
  Filled 2020-05-29: qty 2

## 2020-05-29 MED ORDER — ALBUTEROL SULFATE (2.5 MG/3ML) 0.083% IN NEBU: 2.5000 mg | INHALATION_SOLUTION | RESPIRATORY_TRACT | Status: DC | PRN

## 2020-05-29 NOTE — Progress Notes (Signed)
PROGRESS NOTE  Edward Mueller TLX:[id number] DOB: September 08, 1970 DOA: 05/24/2020 PCP: Pcp, No  HPI/Recap of past 90 hours: 50 year old gentleman with no reported medical problems who donates plasma 2 times a week, donated plasma on 4/1 and after about 24 hours he started not feeling well, generalized malaise, nausea vomiting and shortness of breath and pleuritic chest pain.  Cough with blood-tinged sputum.  Came to the emergency room on 4/3, was found to be febrile and tachypneic with lactic acidosis.  Resuscitated and admitted to ICU then transferred to progressive care unit with stable blood pressures. CT chest without contrast with extensive pneumonia, possible septic emboli.    Also presented with left arm cellulitis which has now resolved.  Ultimately blood cultures grew MSSA bacteremia.  Infectious disease consulted and following.  Infective endocarditis has been ruled out with TEE done on 05/27/2020.  Due to worsening clinical picture PCCM was consulted.  Hospital course complicated by tachypnea, productive cough with blood tinged mucus, and dyspnea with minimal exertion.  Chest x-ray done on 05/28/2020 personally reviewed showing diffuse bilateral pulmonary infiltrates.  Sputum culture ordered.  He is currently on cefazolin for MSSA bacteremia.  05/29/20: Patient was seen and examined at his bedside.  States his breathing and cough are unchanged.  Fever this a.m. with T-max of 100.9, worsening leukocytosis up to 25K.  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 Sepsis as evidenced by leukocytosis, fever with T-max of 100.8, and MSSA bacteremia. Negative TEE for infective endocarditis. Blood cultures negative to date for all 4 bottles. Currently on cefazolin 2 g IV every 8 hours, day #5. Plan for 2  weeks IV cefazolin through 06/07/2020 and then continuation with oral treatment with pulmonary septic emboli findings as recommended by infectious disease.  Appreciate Dr. Ephriam Knuckles assistance.   Acute hypoxic respiratory failure secondary to suspected bilateral septic pulmonary emboli D-dimer on presentation greater than 4 D-dimer is downtrending. Continue to maintain O2 saturation greater than 90% Appreciate pulmonary's assistance. Wean off oxygen supplementation as tolerated He was not on oxygen supplementation at baseline.  Resolved Non anion gap metabolic acidosis Serum bicarb 21>> 26.  Suspected bilateral septic pulmonary emboli Management as stated above. Obtain sputum culture and follow results. Continue IV antibiotics as recommended by infectious disease.  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 650cc urine output recorded in the last 24 hours. Continue to monitor urine output Repeat renal panel in the morning.  Mild hypovolemic hyponatremia Encourage increase in oral intake Serum sodium 134 Repeat BMP in the morning  Hypokalemia Serum potassium 3.4 Repleted orally Serum magnesium 2.8 Repeat BMP in the morning  Resolved hypophosphatemia Encourage oral intake Serum phosphorus 2.4>> 2.5. Replete and repeat level in the morning.  Resolved left upper extremity cellulitis Initially presented with left upper extremity cellulitis which has now resolved  Left upper extremity superficial DVT Continue to monitor  THC/tobacco use UDS positive for THC on 05/24/2020 Denies use of cocaine or intravenous drug abuse Provide cessation counseling      Code Status: Full code.  Family Communication: Updated his sister via phone on 05/27/2020, called on 05/28/2020 no answer.  Disposition Plan: Likely will discharge to home once oxygen requirement is improved and infectious disease/PCCM sign  off.   Consultants:  Infectious disease.  PCCM  Procedures:  2D echo on 05/25/2020  TEE on 05/27/2020.  Antimicrobials:  Cefazolin for 05/25/20>>  DVT prophylaxis: Subcu heparin 3 times daily.  Status is: Inpatient    Dispo: The patient is from: Home.              Anticipated d/c is to: Home.              Patient currently not stable for discharge due to acute hypoxic respiratory failure, management of MSSA bacteremia.   Difficult to place patient: Not applicable.        Objective: Vitals:   05/29/20 0403 05/29/20 0500 05/29/20 0732 05/29/20 1133  BP: 140/86  127/79 (!) 146/88  Pulse: 81  83 90  Resp:   18 19  Temp: 99.4 F (37.4 C)  99.1 F (37.3 C) (!) 100.9 F (38.3 C)  TempSrc: Oral  Axillary Oral  SpO2:   96% 95%  Weight:  68.9 kg    Height:        Intake/Output Summary (Last 24 hours) at 05/29/2020 1231 Last data filed at 05/29/2020 0800 Gross per 24 hour  Intake --  Output 980 ml  Net -980 ml   Filed Weights   05/24/20 1410 05/29/20 0500  Weight: 68 kg 68.9 kg    Exam:  . General: 50 y.o. year-old male frail/weak appearing in no acute distress.  Tachypneic.  Alert oriented x3. . Cardiovascular: Regular rate and rhythm no rubs or gallops.  Marland Kitchen. Respiratory: Diffuse rales bilaterally no wheezing noted.   . Abdomen: Soft Nontender Normal Bowel Sounds Present. . Musculoskeletal: No lower extremity edema bilaterally.   . Skin: No ulcerative lesions noted. Marland Kitchen. Psychiatry: Mood is appropriate for condition and setting.   Data Reviewed: CBC: Recent Labs  Lab 05/24/20 1417 05/24/20 1512 05/25/20 0218 05/26/20 0304 05/27/20 0244 05/28/20 0255 05/29/20 0308  WBC 6.4  --  3.2* 5.3 10.9* 16.4* 25.0*  NEUTROABS 5.8  --   --   --   --  12.9*  --   HGB 16.8   < > 14.5 13.7 12.7* 12.5* 11.9*  HCT 48.0   < > 42.2 39.1 36.1* 35.8* 33.8*  MCV 95.2  --  95.9 91.6 92.3 92.5 92.6  PLT 207  --  168 180 160 PLATELET CLUMPS NOTED ON SMEAR, UNABLE TO ESTIMATE  140*   < > = values in this interval not displayed.   Basic Metabolic Panel: Recent Labs  Lab 05/25/20 0218 05/26/20 0304 05/27/20 0244 05/28/20 0255 05/29/20 0308  NA 134* 133* 133* 135 134*  K 4.0 3.7 3.5 3.7 3.4*  CL 102 100 101 102 101  CO2 19* 23 25 21* 26  GLUCOSE 124* 107* 116* 111* 122*  BUN 34* 42* 46* 48* 44*  CREATININE 2.79* 2.53* 1.90* 1.50* 1.51*  CALCIUM 7.1* 7.5* 7.6* 7.4* 7.4*  MG 1.7 2.1 2.8* 3.0* 2.8*  PHOS  --  3.5 2.4* 2.5 2.5   GFR: Estimated Creatinine Clearance: 57 mL/min (A) (by C-G formula based on SCr of 1.51 mg/dL (H)). Liver Function Tests: Recent Labs  Lab 05/24/20 1417  AST 35  ALT 24  ALKPHOS 56  BILITOT 1.2  PROT 6.0*  ALBUMIN 2.6*   No results for input(s): LIPASE, AMYLASE in the last 168 hours. No results for input(s): AMMONIA in the last 168 hours. Coagulation Profile: Recent Labs  Lab 05/24/20 1417  INR 1.1   Cardiac Enzymes: No results for input(s): CKTOTAL, CKMB, CKMBINDEX, TROPONINI in the  last 168 hours. BNP (last 3 results) No results for input(s): PROBNP in the last 8760 hours. HbA1C: No results for input(s): HGBA1C in the last 72 hours. CBG: No results for input(s): GLUCAP in the last 168 hours. Lipid Profile: No results for input(s): CHOL, HDL, LDLCALC, TRIG, CHOLHDL, LDLDIRECT in the last 72 hours. Thyroid Function Tests: No results for input(s): TSH, T4TOTAL, FREET4, T3FREE, THYROIDAB in the last 72 hours. Anemia Panel: No results for input(s): VITAMINB12, FOLATE, FERRITIN, TIBC, IRON, RETICCTPCT in the last 72 hours. Urine analysis:    Component Value Date/Time   COLORURINE YELLOW 05/24/2020 0502   APPEARANCEUR HAZY (A) 05/24/2020 0502   LABSPEC 1.016 05/24/2020 0502   PHURINE 5.0 05/24/2020 0502   GLUCOSEU 50 (A) 05/24/2020 0502   HGBUR MODERATE (A) 05/24/2020 0502   BILIRUBINUR NEGATIVE 05/24/2020 0502   KETONESUR NEGATIVE 05/24/2020 0502   PROTEINUR 100 (A) 05/24/2020 0502   NITRITE NEGATIVE  05/24/2020 0502   LEUKOCYTESUR NEGATIVE 05/24/2020 0502   Sepsis Labs: (procalcitonin:4,lacticidven:4)  ) Recent Results (from the past 240 hour(s))  Blood Culture (routine x 2)     Status: Abnormal   Collection Time: 05/24/20  3:08 PM   Specimen: Left Antecubital; Blood  Result Value Ref Range Status   Specimen Description LEFT ANTECUBITAL  Final   Special Requests   Final    BOTTLES DRAWN AEROBIC AND ANAEROBIC Blood Culture results may not be optimal due to an inadequate volume of blood received in culture bottles   Culture  Setup Time   Final    GRAM POSITIVE COCCI IN CLUSTERS IN BOTH AEROBIC AND ANAEROBIC BOTTLES CRITICAL RESULT CALLED TO, READ BACK BY AND VERIFIED WITH: PHARMD A WOLF [id number] AT 754 AM BY CM Performed at [facility], [address], [place], [place] [address]    Culture STAPHYLOCOCCUS AUREUS (A)  Final   Report Status 05/27/2020 FINAL  Final   Organism ID, Bacteria STAPHYLOCOCCUS AUREUS  Final      Susceptibility   Staphylococcus aureus - MIC*    CIPROFLOXACIN <=0.5 SENSITIVE Sensitive     ERYTHROMYCIN >=8 RESISTANT Resistant     GENTAMICIN <=0.5 SENSITIVE Sensitive     OXACILLIN 0.5 SENSITIVE Sensitive     TETRACYCLINE <=1 SENSITIVE Sensitive     VANCOMYCIN <=0.5 SENSITIVE Sensitive     TRIMETH/SULFA <=10 SENSITIVE Sensitive     CLINDAMYCIN <=0.25 SENSITIVE Sensitive     RIFAMPIN <=0.5 SENSITIVE Sensitive     Inducible Clindamycin NEGATIVE Sensitive     * STAPHYLOCOCCUS AUREUS  Blood Culture ID Panel (Reflexed)     Status: Abnormal   Collection Time: 05/24/20  3:08 PM  Result Value Ref Range Status   Enterococcus faecalis NOT DETECTED NOT DETECTED Final   Enterococcus Faecium NOT DETECTED NOT DETECTED Final   Listeria monocytogenes NOT DETECTED NOT DETECTED Final   Staphylococcus species DETECTED (A) NOT DETECTED Final    Comment: CRITICAL RESULT CALLED TO, READ BACK BY AND VERIFIED WITH: PHARMD A WOLF [id number] AT 800 BY CM     Staphylococcus aureus (BCID) DETECTED (A) NOT DETECTED Final    Comment: CRITICAL RESULT CALLED TO, READ BACK BY AND VERIFIED WITH: PHARMD A WOLF [id number] AT 800 BY CM    Staphylococcus epidermidis NOT DETECTED NOT DETECTED Final   Staphylococcus lugdunensis NOT DETECTED NOT DETECTED Final   Streptococcus species NOT DETECTED NOT DETECTED Final   Streptococcus agalactiae NOT DETECTED NOT DETECTED Final   Streptococcus pneumoniae NOT DETECTED NOT DETECTED Final  Streptococcus pyogenes NOT DETECTED NOT DETECTED Final   A.calcoaceticus-baumannii NOT DETECTED NOT DETECTED Final   Bacteroides fragilis NOT DETECTED NOT DETECTED Final   Enterobacterales NOT DETECTED NOT DETECTED Final   Enterobacter cloacae complex NOT DETECTED NOT DETECTED Final   Escherichia coli NOT DETECTED NOT DETECTED Final   Klebsiella aerogenes NOT DETECTED NOT DETECTED Final   Klebsiella oxytoca NOT DETECTED NOT DETECTED Final   Klebsiella pneumoniae NOT DETECTED NOT DETECTED Final   Proteus species NOT DETECTED NOT DETECTED Final   Salmonella species NOT DETECTED NOT DETECTED Final   Serratia marcescens NOT DETECTED NOT DETECTED Final   Haemophilus influenzae NOT DETECTED NOT DETECTED Final   Neisseria meningitidis NOT DETECTED NOT DETECTED Final   Pseudomonas aeruginosa NOT DETECTED NOT DETECTED Final   Stenotrophomonas maltophilia NOT DETECTED NOT DETECTED Final   Candida albicans NOT DETECTED NOT DETECTED Final   Candida auris NOT DETECTED NOT DETECTED Final   Candida glabrata NOT DETECTED NOT DETECTED Final   Candida krusei NOT DETECTED NOT DETECTED Final   Candida parapsilosis NOT DETECTED NOT DETECTED Final   Candida tropicalis NOT DETECTED NOT DETECTED Final   Cryptococcus neoformans/gattii NOT DETECTED NOT DETECTED Final   Meth resistant mecA/C and MREJ NOT DETECTED NOT DETECTED Final    Comment: Performed at [facility], [address], [place], [place] [address]  Resp Panel by RT-PCR (Flu A&B,  Covid) Nasopharyngeal Swab     Status: None   Collection Time: 05/24/20  3:14 PM   Specimen: Nasopharyngeal Swab; Nasopharyngeal(NP) swabs in vial transport medium  Result Value Ref Range Status   SARS Coronavirus 2 by RT PCR NEGATIVE NEGATIVE Final    Comment: (NOTE) SARS-CoV-2 target nucleic acids are NOT DETECTED.  The SARS-CoV-2 RNA is generally detectable in upper respiratory specimens during the acute phase of infection. The lowest concentration of SARS-CoV-2 viral copies this assay can detect is 138 copies/mL. A negative result does not preclude SARS-Cov-2 infection and should not be used as the sole basis for treatment or other patient management decisions. A negative result may occur with  improper specimen collection/handling, submission of specimen other than nasopharyngeal swab, presence of viral mutation(s) within the areas targeted by this assay, and inadequate number of viral copies(<138 copies/mL). A negative result must be combined with clinical observations, patient history, and epidemiological information. The expected result is Negative.  Fact Sheet for Patients:  BloggerCourse.com  Fact Sheet for Healthcare Providers:  SeriousBroker.it  This test is no t yet approved or cleared by the [place] FDA and  has been authorized for detection and/or diagnosis of SARS-CoV-2 by FDA under an Emergency Use Authorization (EUA). This EUA will remain  in effect (meaning this test can be used) for the duration of the COVID-19 declaration under Section 564(b)(1) of the Act, 21 U.S.C.section 360bbb-3(b)(1), unless the authorization is terminated  or revoked sooner.       Influenza A by PCR NEGATIVE NEGATIVE Final   Influenza B by PCR NEGATIVE NEGATIVE Final    Comment: (NOTE) The Xpert Xpress SARS-CoV-2/FLU/RSV plus assay is intended as an aid in the diagnosis of influenza from Nasopharyngeal swab specimens and should  not be used as a sole basis for treatment. Nasal washings and aspirates are unacceptable for Xpert Xpress SARS-CoV-2/FLU/RSV testing.  Fact Sheet for Patients: BloggerCourse.com  Fact Sheet for Healthcare Providers: SeriousBroker.it  This test is not yet approved or cleared by the [place] FDA and has been authorized for detection and/or diagnosis of  SARS-CoV-2 by FDA under an Emergency Use Authorization (EUA). This EUA will remain in effect (meaning this test can be used) for the duration of the COVID-19 declaration under Section 564(b)(1) of the Act, 21 U.S.C. section 360bbb-3(b)(1), unless the authorization is terminated or revoked.  Performed at [facility], [address] [place], [place] [address]   Blood Culture (routine x 2)     Status: Abnormal   Collection Time: 05/24/20  9:45 PM   Specimen: BLOOD LEFT HAND  Result Value Ref Range Status   Specimen Description BLOOD LEFT HAND  Final   Special Requests   Final    BOTTLES DRAWN AEROBIC AND ANAEROBIC Blood Culture results may not be optimal due to an inadequate volume of blood received in culture bottles   Culture  Setup Time   Final    GRAM POSITIVE COCCI IN CLUSTERS IN BOTH AEROBIC AND ANAEROBIC BOTTLES CRITICAL VALUE NOTED.  VALUE IS CONSISTENT WITH PREVIOUSLY REPORTED AND CALLED VALUE.    Culture (A)  Final    STAPHYLOCOCCUS AUREUS SUSCEPTIBILITIES PERFORMED ON PREVIOUS CULTURE WITHIN THE LAST 5 DAYS. Performed at [facility], [address], [place], [place] [address]    Report Status 05/27/2020 FINAL  Final  MRSA PCR Screening     Status: None   Collection Time: 05/24/20 11:46 PM   Specimen: Nasopharyngeal  Result Value Ref Range Status   MRSA by PCR NEGATIVE NEGATIVE Final    Comment:        The GeneXpert MRSA Assay (FDA approved for NASAL specimens only), is one component of a comprehensive MRSA colonization surveillance program. It is  not intended to diagnose MRSA infection nor to guide or monitor treatment for MRSA infections. Performed at [facility], [address], [place], [place] [address]   Urine culture     Status: None   Collection Time: 05/24/20 11:59 PM   Specimen: In/Out Cath Urine  Result Value Ref Range Status   Specimen Description IN/OUT CATH URINE  Final   Special Requests NONE  Final   Culture   Final    NO GROWTH Performed at [facility], [address], [place], [place] [address]    Report Status 05/26/2020 FINAL  Final  Culture, blood (routine x 2)     Status: None (Preliminary result)   Collection Time: 05/25/20 10:19 PM   Specimen: BLOOD LEFT HAND  Result Value Ref Range Status   Specimen Description BLOOD LEFT HAND  Final   Special Requests   Final    BOTTLES DRAWN AEROBIC AND ANAEROBIC Blood Culture adequate volume   Culture   Final    NO GROWTH 4 DAYS Performed at [facility], [address] [place], [place] [address]    Report Status PENDING  Incomplete  Culture, blood (routine x 2)     Status: None (Preliminary result)   Collection Time: 05/25/20 10:32 PM   Specimen: BLOOD RIGHT HAND  Result Value Ref Range Status   Specimen Description BLOOD RIGHT HAND  Final   Special Requests AEROBIC BOTTLE ONLY Blood Culture adequate volume  Final   Culture   Final    NO GROWTH 4 DAYS Performed at [facility], [address], [place], [place] [address]    Report Status PENDING  Incomplete      Studies: No results found.  Scheduled Meds: . acetaminophen  650 mg Oral Q6H  . guaiFENesin  400 mg Oral BID  .  heparin  5,000 Units Subcutaneous Q8H    Continuous Infusions: .  ceFAZolin (ANCEF) IV 2 g (05/29/20 0540)     LOS: 5 days     Darlin Drop, MD [facility] Pager [phone]  If 7PM-7AM, please contact night-coverage [email or web address] Password [facility] 05/29/2020, 12:31 PM

## 2020-05-29 NOTE — Progress Notes (Signed)
[facility]   Reason for visit: Follow up on bacteremia  Interval History: continues to be short of breath, hypoxic, WBC up to 25.  He has been coughing but not able to get much up.  Some blood tinged sputum noted this am.  Concerned about  Day 6 total antibiotics Day 5 cefazolin  Blood cultures 05/25/20 NGTD 4/4 bottles  Physical Exam: Constitutional:  Vitals:   05/29/20 0403 05/29/20 0732  BP: 140/86 127/79  Pulse: 81 83  Resp:  18  Temp: 99.4 F (37.4 C) 99.1 F (37.3 C)  SpO2:  96%   patient appears in mild respiratory distress Respiratory: shallow breath sounds, CTA B, no wheezes, increased respiratory effort Cardiovascular: RRR GI: soft, nt, nd  Review of Systems: Constitutional: negative for fevers and chills Gastrointestinal: negative for nausea and diarrhea Integument/breast: negative for rash  Lab Results  Component Value Date   WBC 25.0 (H) 05/29/2020   HGB 11.9 (L) 05/29/2020   HCT 33.8 (L) 05/29/2020   MCV 92.6 05/29/2020   PLT 140 (L) 05/29/2020    Lab Results  Component Value Date   CREATININE 1.51 (H) 05/29/2020   BUN 44 (H) 05/29/2020   NA 134 (L) 05/29/2020   K 3.4 (L) 05/29/2020   CL 101 05/29/2020   CO2 26 05/29/2020    Lab Results  Component Value Date   ALT 24 05/24/2020   AST 35 05/24/2020   ALKPHOS 56 05/24/2020     Microbiology: Recent Results (from the past 240 hour(s))  Blood Culture (routine x 2)     Status: Abnormal   Collection Time: 05/24/20  3:08 PM   Specimen: Left Antecubital; Blood  Result Value Ref Range Status   Specimen Description LEFT ANTECUBITAL  Final   Special Requests   Final    BOTTLES DRAWN AEROBIC AND ANAEROBIC Blood Culture results may not be optimal due to an inadequate volume of blood received in culture bottles   Culture  Setup Time   Final    GRAM POSITIVE COCCI IN CLUSTERS IN BOTH AEROBIC AND ANAEROBIC BOTTLES CRITICAL RESULT CALLED TO, READ BACK BY AND VERIFIED WITH:  PHARMD A WOLF [id number] AT 754 AM BY CM Performed at [facility], [address], [place], [place] [address]    Culture STAPHYLOCOCCUS AUREUS (A)  Final   Report Status 05/27/2020 FINAL  Final   Organism ID, Bacteria STAPHYLOCOCCUS AUREUS  Final      Susceptibility   Staphylococcus aureus - MIC*    CIPROFLOXACIN <=0.5 SENSITIVE Sensitive     ERYTHROMYCIN >=8 RESISTANT Resistant     GENTAMICIN <=0.5 SENSITIVE Sensitive     OXACILLIN 0.5 SENSITIVE Sensitive     TETRACYCLINE <=1 SENSITIVE Sensitive     VANCOMYCIN <=0.5 SENSITIVE Sensitive     TRIMETH/SULFA <=10 SENSITIVE Sensitive     CLINDAMYCIN <=0.25 SENSITIVE Sensitive     RIFAMPIN <=0.5 SENSITIVE Sensitive     Inducible Clindamycin NEGATIVE Sensitive     * STAPHYLOCOCCUS AUREUS  Blood Culture ID Panel (Reflexed)     Status: Abnormal   Collection Time: 05/24/20  3:08 PM  Result Value Ref Range Status   Enterococcus faecalis NOT DETECTED NOT DETECTED Final   Enterococcus Faecium NOT DETECTED NOT DETECTED Final   Listeria monocytogenes NOT DETECTED NOT DETECTED Final   Staphylococcus species DETECTED (A) NOT DETECTED Final    Comment: CRITICAL RESULT CALLED TO, READ BACK BY AND VERIFIED WITH: PHARMD A WOLF [id number] AT  800 BY CM    Staphylococcus aureus (BCID) DETECTED (A) NOT DETECTED Final    Comment: CRITICAL RESULT CALLED TO, READ BACK BY AND VERIFIED WITH: PHARMD A WOLF [phone] AT 800 BY CM    Staphylococcus epidermidis NOT DETECTED NOT DETECTED Final   Staphylococcus lugdunensis NOT DETECTED NOT DETECTED Final   Streptococcus species NOT DETECTED NOT DETECTED Final   Streptococcus agalactiae NOT DETECTED NOT DETECTED Final   Streptococcus pneumoniae NOT DETECTED NOT DETECTED Final   Streptococcus pyogenes NOT DETECTED NOT DETECTED Final   A.calcoaceticus-baumannii NOT DETECTED NOT DETECTED Final   Bacteroides fragilis NOT DETECTED NOT DETECTED Final   Enterobacterales NOT DETECTED NOT DETECTED Final   Enterobacter  cloacae complex NOT DETECTED NOT DETECTED Final   Escherichia coli NOT DETECTED NOT DETECTED Final   Klebsiella aerogenes NOT DETECTED NOT DETECTED Final   Klebsiella oxytoca NOT DETECTED NOT DETECTED Final   Klebsiella pneumoniae NOT DETECTED NOT DETECTED Final   Proteus species NOT DETECTED NOT DETECTED Final   Salmonella species NOT DETECTED NOT DETECTED Final   Serratia marcescens NOT DETECTED NOT DETECTED Final   Haemophilus influenzae NOT DETECTED NOT DETECTED Final   Neisseria meningitidis NOT DETECTED NOT DETECTED Final   Pseudomonas aeruginosa NOT DETECTED NOT DETECTED Final   Stenotrophomonas maltophilia NOT DETECTED NOT DETECTED Final   Candida albicans NOT DETECTED NOT DETECTED Final   Candida auris NOT DETECTED NOT DETECTED Final   Candida glabrata NOT DETECTED NOT DETECTED Final   Candida krusei NOT DETECTED NOT DETECTED Final   Candida parapsilosis NOT DETECTED NOT DETECTED Final   Candida tropicalis NOT DETECTED NOT DETECTED Final   Cryptococcus neoformans/gattii NOT DETECTED NOT DETECTED Final   Meth resistant mecA/C and MREJ NOT DETECTED NOT DETECTED Final    Comment: Performed at [facility], [address] [place], [place] [address]  Resp Panel by RT-PCR (Flu A&B, Covid) Nasopharyngeal Swab     Status: None   Collection Time: 05/24/20  3:14 PM   Specimen: Nasopharyngeal Swab; Nasopharyngeal(NP) swabs in vial transport medium  Result Value Ref Range Status   SARS Coronavirus 2 by RT PCR NEGATIVE NEGATIVE Final    Comment: (NOTE) SARS-CoV-2 target nucleic acids are NOT DETECTED.  The SARS-CoV-2 RNA is generally detectable in upper respiratory specimens during the acute phase of infection. The lowest concentration of SARS-CoV-2 viral copies this assay can detect is 138 copies/mL. A negative result does not preclude SARS-Cov-2 infection and should not be used as the sole basis for treatment or other patient management decisions. A negative result may occur  with  improper specimen collection/handling, submission of specimen other than nasopharyngeal swab, presence of viral mutation(s) within the areas targeted by this assay, and inadequate number of viral copies(<138 copies/mL). A negative result must be combined with clinical observations, patient history, and epidemiological information. The expected result is Negative.  Fact Sheet for Patients:  BloggerCourse.comhttps://www.fda.gov/media/152166/download  Fact Sheet for Healthcare Providers:  SeriousBroker.ithttps://www.fda.gov/media/152162/download  This test is no t yet approved or cleared by the [place] FDA and  has been authorized for detection and/or diagnosis of SARS-CoV-2 by FDA under an Emergency Use Authorization (EUA). This EUA will remain  in effect (meaning this test can be used) for the duration of the COVID-19 declaration under Section 564(b)(1) of the Act, 21 U.S.C.section 360bbb-3(b)(1), unless the authorization is terminated  or revoked sooner.       Influenza A by PCR NEGATIVE NEGATIVE Final   Influenza B by PCR NEGATIVE NEGATIVE  Final    Comment: (NOTE) The Xpert Xpress SARS-CoV-2/FLU/RSV plus assay is intended as an aid in the diagnosis of influenza from Nasopharyngeal swab specimens and should not be used as a sole basis for treatment. Nasal washings and aspirates are unacceptable for Xpert Xpress SARS-CoV-2/FLU/RSV testing.  Fact Sheet for Patients: BloggerCourse.com  Fact Sheet for Healthcare Providers: SeriousBroker.it  This test is not yet approved or cleared by the [place] FDA and has been authorized for detection and/or diagnosis of SARS-CoV-2 by FDA under an Emergency Use Authorization (EUA). This EUA will remain in effect (meaning this test can be used) for the duration of the COVID-19 declaration under Section 564(b)(1) of the Act, 21 U.S.C. section 360bbb-3(b)(1), unless the authorization is terminated  or revoked.  Performed at [facility], [address] [place], [place] [address]   Blood Culture (routine x 2)     Status: Abnormal   Collection Time: 05/24/20  9:45 PM   Specimen: BLOOD LEFT HAND  Result Value Ref Range Status   Specimen Description BLOOD LEFT HAND  Final   Special Requests   Final    BOTTLES DRAWN AEROBIC AND ANAEROBIC Blood Culture results may not be optimal due to an inadequate volume of blood received in culture bottles   Culture  Setup Time   Final    GRAM POSITIVE COCCI IN CLUSTERS IN BOTH AEROBIC AND ANAEROBIC BOTTLES CRITICAL VALUE NOTED.  VALUE IS CONSISTENT WITH PREVIOUSLY REPORTED AND CALLED VALUE.    Culture (A)  Final    STAPHYLOCOCCUS AUREUS SUSCEPTIBILITIES PERFORMED ON PREVIOUS CULTURE WITHIN THE LAST 5 DAYS. Performed at [facility], [address], [place], [place] [address]    Report Status 05/27/2020 FINAL  Final  MRSA PCR Screening     Status: None   Collection Time: 05/24/20 11:46 PM   Specimen: Nasopharyngeal  Result Value Ref Range Status   MRSA by PCR NEGATIVE NEGATIVE Final    Comment:        The GeneXpert MRSA Assay (FDA approved for NASAL specimens only), is one component of a comprehensive MRSA colonization surveillance program. It is not intended to diagnose MRSA infection nor to guide or monitor treatment for MRSA infections. Performed at [facility], [address], [place], [place] [address]   Urine culture     Status: None   Collection Time: 05/24/20 11:59 PM   Specimen: In/Out Cath Urine  Result Value Ref Range Status   Specimen Description IN/OUT CATH URINE  Final   Special Requests NONE  Final   Culture   Final    NO GROWTH Performed at [facility], [address] [place], [place] [address]    Report Status 05/26/2020 FINAL  Final  Culture, blood (routine x 2)     Status: None (Preliminary result)   Collection Time: 05/25/20 10:19 PM   Specimen: BLOOD LEFT HAND  Result Value Ref  Range Status   Specimen Description BLOOD LEFT HAND  Final   Special Requests   Final    BOTTLES DRAWN AEROBIC AND ANAEROBIC Blood Culture adequate volume   Culture   Final    NO GROWTH 4 DAYS Performed at [facility], [address], [place], [place] [address]    Report Status PENDING  Incomplete  Culture, blood (routine x 2)     Status: None (Preliminary result)   Collection Time: 05/25/20 10:32 PM   Specimen: BLOOD RIGHT HAND  Result Value Ref  Range Status   Specimen Description BLOOD RIGHT HAND  Final   Special Requests AEROBIC BOTTLE ONLY Blood Culture adequate volume  Final   Culture   Final    NO GROWTH 4 DAYS Performed at [facility], [address], [place], [place] [address]    Report Status PENDING  Incomplete    Impression/Plan:  1. MSSA bacteremia - negative TEE and repeated blood cultures ngtd from 4/4.  Will plan for 2 weeks of IV cefazolin through 06/07/20 and then consider continuation with oral treatment with pulmonary septic emboli findings.   2.  Respiratory distress - it continues with him being hypoxic and dyspneic with exertion with pulmonary findings as above.  Some blood tinged sputum noted by me this am while he was collecting a sample.  This may be nasal from the nasal cannula but unclear.   Pulmonary following. I am not sure what is causing such respiratory distress.  Certainly some element of anxiety.  May need further evaluation.   3.  access - antibiotics as above.  OK from ID standpoint for picc line if appropriate.

## 2020-05-29 NOTE — Progress Notes (Signed)
This is a progress note  NAME:  Razi Hickle, MRN:  [id number], DOB:  [birth year], LOS: 5 ADMISSION DATE:  05/24/2020, CONSULTATION DATE:  05/24/2020 REFERRING MD: Lockie Mola , CHIEF COMPLAINT: Dyspnea  History of Present Illness:  50 year old man who presented to [facility] on 4/3 with c/o dyspnea.    Reportedly was in his usual state of health until 48 hours prior to admit. 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  On 4/1 he donated plasma.  After that he woke with a "knot on his arm" and felt poorly.  He had malaise, N/V and SOB with associated central chest pain.  He began coughing up bloody sputum.  He was found to have sepsis in the setting of left arm cellulitis, extensive PNA, MSSA bacteremia & AKI.  TEE negative for endocarditis.   Pertinent  Medical History  Tobacco / THC Use   Significant Hospital Events: Including procedures, antibiotic start and stop dates in addition to other pertinent events   . 4/3 Admit with dyspnea, febrile with elevated lactate, imaging consistent with PNA, L arm cellulitis. Negative HIV, COVID, Flu. Cultures positive for MSSA.  Marland Kitchen 4/4 To [facility]  . 4/6 TEE negative for vegetation  . 4/7 PCCM called back for increased O2 needs. O2 weaned to 3L on exam, sats 97%. VBG 7.4 / 42 CXR 4/7>> Persistent diffuse bilateral nodular and geographic areas of airspace infiltrate. Imaging findings most suggestive of septic emboli Small bilateral pleural effusions. 4/8 WBC up to 25 K, febrile Pro calcitonin has dropped from 31 to 20.43 over night Net + 5 L Pt. Has not got OOB to chair today. He is taking shallow rapid breaths because he states it hurts to take a deep breath.   States he has been coughing up blood,.   Interim History / Subjective:   T max 100.9 Remains on 3 L [place] with sats of 95% He remains tachypneaic  WBC up to 25,000 PCT 31 on  4/7 PCT 20.43 on 4/8   05/25/2020>> Blood Cx>> pending 05/24/2020>> Blood  Cx>> No growth Staph Aureus reported  05/24/2020>> Urine Cx>> No growth   Ancef 4/4>>  ID Consulted 4/4 MSSA bacteremia  Objective   Blood pressure 127/79, pulse 83, temperature 99.1 F (37.3 C), temperature source Axillary, resp. rate 18, height 6\' 3"  (1.905 m), weight 68.9 kg, SpO2 96 %.        Intake/Output Summary (Last 24 hours) at 05/29/2020 1212 Last data filed at 05/29/2020 0800 Gross per 24 hour  Intake --  Output 980 ml  Net -980 ml   Filed Weights   05/24/20 1410 05/29/20 0500  Weight: 68 kg 68.9 kg    Examination: General: thin adult male lying in bed watching TV. Mild respiratory distress HEENT:NCAT,  MM pink/dry, anicteric, [place] O2 Neuro: AAOx4, speech clear, MAE, appropriate CV: s1s2 RRR, ST on monitor, no m/r/g PULM: tachypnea but no accessory muscle use, lungs bilaterally diminished , shallow rapid respirations, No wheeze appreciated GI: soft, bsx4 active  Extremities: warm/dry, no LE edema  Skin: no rashes or lesions       Resolved Hospital Problem list   Septic Shock   Assessment & Plan:   Sepsis secondary to L Arm Cellulitis, MSSA Bacteremia with associated PNA TEE negative for vegetation  -appreciate ID  -continue cefazolin   Acute Hypoxemic Respiratory Failure secondary to MSSA PNA  Tobacco / [facility] Use  May  have some degree of undiagnosed underlying obstructive disease given smoking history.   -wean O2 for sats >90% -anticipate he will have slow recover given extent of infiltrates - Will add Albuterol nebs prn to see if he gets any relief -Aggressive pulmonary hygiene - IS / flutter valve Q 1 while awake  -pain control to allow for adequate pulmonary hygiene > added Oxycodone Q 4 prn -follow intermittent CXR  -may need bipap support to splint respiratory muscles to avoid fatigue  -no acute intubation need at this time, but needs careful monitoring -VBG reviewed, pH / pCO2 wnl -guaifenesin for as mucolytic - Consider diuresis once renal  function improves  AKI in setting of Sepsis  Resolved shock  -Trend BMP / urinary output -Replace electrolytes as indicated -Avoid nephrotoxic agents, ensure adequate renal perfusion   LUE Cellulitis with Superficial DVT -monitor   Pt needs to participate in active aggressive pulmonary Toilet, get OOB to chair, and use Flutter valve and IS Q 1 while awake.    Best practice (right click and "Reselect all SmartList Selections" daily)  Diet:  Oral Pain/Anxiety/Delirium protocol (if indicated): No oral pain meds only VAP protocol (if indicated): Not indicated DVT prophylaxis: LMWH GI prophylaxis: N/A Glucose control:  SSI No Central venous access:  N/A Arterial line:  N/A Foley:  N/A Mobility:  OOB  PT consulted: N/A Last date of multidisciplinary goals of care discussion [n/a] Code Status:  full code Disposition: PCU   Bevelyn Ngo, MSN, AGACNP-BC [facility] See Amion for personal pager details 05/29/2020, 12:12 PM  From 7A-7P if no response, please call [phone] hospital use only, not for office patient use. After hours, please call ELink [phone]>> hospital use only, not for office patient use

## 2020-05-29 NOTE — Progress Notes (Signed)
PHARMACY CONSULT NOTE FOR:  OUTPATIENT  PARENTERAL ANTIBIOTIC THERAPY (OPAT)  Indication: Bacteremia Regimen: Cefazolin  End date: 06/07/2020  IV antibiotic discharge orders are pended. To discharging provider:  please sign these orders via discharge navigator,  Select New Orders & click on the button choice - Manage This Unsigned Work.    Thank you for allowing pharmacy to be a part of this patient's care.  Yvetta Coder, PharmD PGY1 Acute Care Pharmacy Resident

## 2020-05-29 NOTE — Progress Notes (Signed)
Physical Therapy Treatment Patient Details Name: Edward Mueller MRN: [id number] DOB: Apr 11, 1970 Today's Date: 05/29/2020    History of Present Illness Pt is a 50 y.o. 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 Chest CT shows multiple peripheral infiltrates consistent with embolic phenomena; concern for endocarditis. Workup for compensated septic shock due to LUE cellulitis, MSSA bactermia of unclear source.  S/p TEE 4/6 with no evidence of endocarditis. UDS (+) THC. No reported PMH; pt reports donating plasma 2x/wk.   PT Comments    Pt progressing with mobility, remains limited by significant SOB, productive cough and some anxiety with minimal activity. Pt educ on importance of more frequent activity throughout the day (reports he has only been "up" 1x today to void in urinal). Educ pt on seated/standing therex to complete within confines of lines/O2. Encouraged ambulation with nursing staff to assist with lines/O2 set-up. Will continue to follow acutely.  SpO2 down to 82% on 2L O2 with activity, up to 88% on 4L with activity Maintaining >/88% on 3L at rest   Follow Up Recommendations  No PT follow up     Equipment Recommendations  None recommended by PT    Recommendations for Other Services       Precautions / Restrictions Precautions Precautions: Fall;Other (comment) Precaution Comments: SOB and anxious with minimal activity Restrictions Weight Bearing Restrictions: No    Mobility  Bed Mobility Overal bed mobility: Modified Independent Bed Mobility: Supine to Sit                Transfers Overall transfer level: Independent Equipment used: None Transfers: Sit to/from [employer] transfer comment: Able to stand from bed, low toilet height and recliner indep without DME  Ambulation/Gait Ambulation/Gait assistance: Supervision Gait  Distance (Feet): 40 Feet Assistive device: None Gait Pattern/deviations: Step-through pattern;Decreased stride length Gait velocity: Decreased   General Gait Details: Slow, steady ambulation in room with supervision for safety/lines/SpO2; pt with DOE 3/4; declined further distance secondary to fatigue and SOB, pt states "I need a breakAnimator    Modified Rankin (Stroke Patients Only)       Balance Overall balance assessment: No apparent balance deficits (not formally assessed)                                          Cognition Arousal/Alertness: Awake/alert Behavior During Therapy: WFL for tasks assessed/performed;Anxious;Flat affect Overall Cognitive Status: Within Functional Limits for tasks assessed                                 General Comments: Appears anxious regarding SOB; following commands and answering questions appropriately; affect improved this session      Exercises Other Exercises Other Exercises: IS x4 (pt pulling <500 mL), flutter valve x5 Other Exercises: Educ on seated/standing LE therex to perform frequent throguhout day - seated marching, LAQ, repeated sit<>stand, marching in place, steps forwards/backwards within confines of lines/O2    General Comments General comments (skin integrity, edema, etc.): SpO2 down to 82% on 2L O2 Lane (when pleth reliable), up to 88% on 4L O2 with activity; returning to >/88%  on 3L with seated rest. Encouraged more frequent OOB activity as pt only getting up 1x today to use urinal      Pertinent Vitals/Pain Pain Assessment: Faces Faces Pain Scale: Hurts a little bit Pain Location: Chest with coughing Pain Descriptors / Indicators: Guarding;Grimacing Pain Intervention(s): Monitored during session    Home Living                      Prior Function            PT Goals (current goals can now be found in the care plan section) Progress  towards PT goals: Progressing toward goals (slowly)    Frequency    Min 3X/week      PT Plan Current plan remains appropriate    Co-evaluation              AM-PAC PT "6 Clicks" Mobility   Outcome Measure  Help needed turning from your back to your side while in a flat bed without using bedrails?: None Help needed moving from lying on your back to sitting on the side of a flat bed without using bedrails?: None Help needed moving to and from a bed to a chair (including a wheelchair)?: None Help needed standing up from a chair using your arms (e.g., wheelchair or bedside chair)?: None Help needed to walk in hospital room?: A Little Help needed climbing 3-5 steps with a railing? : A Little 6 Click Score: 22    End of Session Equipment Utilized During Treatment: Oxygen Activity Tolerance: Patient tolerated treatment well;Patient limited by fatigue Patient left: in chair;with call bell/phone within reach Nurse Communication: Mobility status PT Visit Diagnosis: Other abnormalities of gait and mobility (R26.89);Other (comment)     Time: 3875-6433 PT Time Calculation (min) (ACUTE ONLY): 27 min  Charges:  $Therapeutic Exercise: 8-22 mins $Therapeutic Activity: 8-22 mins                     Ina Homes, PT, DPT Acute Rehabilitation Services  Pager [phone] Office [phone]  Malachy Chamber 05/29/2020, 3:45 PM

## 2020-05-29 NOTE — Progress Notes (Signed)
Pt complaining of pain in lungs when breathing and holding sides/splinting . Already attempted oxycodone. Vss. MD paged for stronger pain medication. Pt is visabaly anxious and holding his breath and cough. New orders received. Will intiate and cont to monitor pt closely.

## 2020-05-29 NOTE — Progress Notes (Incomplete)
Pt complaing of pain in lungs when breathing and holding sides/splinting . Already atempted oxycodone. Vss. MD paged for stronger pain medication. Pt is visabally anxious and holding

## 2020-05-30 ENCOUNTER — Inpatient Hospital Stay (HOSPITAL_COMMUNITY): Payer: Medicaid Other

## 2020-05-30 DIAGNOSIS — J189 Pneumonia, unspecified organism: Secondary | ICD-10-CM

## 2020-05-30 DIAGNOSIS — J918 Pleural effusion in other conditions classified elsewhere: Secondary | ICD-10-CM

## 2020-05-30 LAB — CULTURE, BLOOD (ROUTINE X 2)
Culture: NO GROWTH
Culture: NO GROWTH
Special Requests: ADEQUATE
Special Requests: ADEQUATE

## 2020-05-30 LAB — CBC
HCT: 36.5 % — ABNORMAL LOW (ref 39.0–52.0)
Hemoglobin: 12.5 g/dL — ABNORMAL LOW (ref 13.0–17.0)
MCH: 32.4 pg (ref 26.0–34.0)
MCHC: 34.2 g/dL (ref 30.0–36.0)
MCV: 94.6 fL (ref 80.0–100.0)
Platelets: 194 10*3/uL (ref 150–400)
RBC: 3.86 MIL/uL — ABNORMAL LOW (ref 4.22–5.81)
RDW: 13.7 % (ref 11.5–15.5)
WBC: 34.8 10*3/uL — ABNORMAL HIGH (ref 4.0–10.5)
nRBC: 0 % (ref 0.0–0.2)

## 2020-05-30 LAB — BASIC METABOLIC PANEL
Anion gap: 6 (ref 5–15)
BUN: 38 mg/dL — ABNORMAL HIGH (ref 6–20)
CO2: 28 mmol/L (ref 22–32)
Calcium: 7.4 mg/dL — ABNORMAL LOW (ref 8.9–10.3)
Chloride: 102 mmol/L (ref 98–111)
Creatinine, Ser: 1.41 mg/dL — ABNORMAL HIGH (ref 0.61–1.24)
GFR, Estimated: >60 mL/min (ref >60)
Glucose, Bld: 111 mg/dL — ABNORMAL HIGH (ref 70–99)
Potassium: 3.5 mmol/L (ref 3.5–5.1)
Sodium: 136 mmol/L (ref 135–145)

## 2020-05-30 LAB — PHOSPHORUS: Phosphorus: 2.4 mg/dL — ABNORMAL LOW (ref 2.5–4.6)

## 2020-05-30 LAB — MAGNESIUM: Magnesium: 2.7 mg/dL — ABNORMAL HIGH (ref 1.7–2.4)

## 2020-05-30 MED ORDER — POTASSIUM & SODIUM PHOSPHATES 280-160-250 MG PO PACK
1.0000 | PACK | Freq: Three times a day (TID) | ORAL | Status: AC
  Administered 2020-05-30 – 2020-05-31 (×4): 1 via ORAL
  Filled 2020-05-30 (×5): qty 1

## 2020-05-30 MED ORDER — STERILE WATER FOR INJECTION IJ SOLN
5.0000 mg | Freq: Every day | RESPIRATORY_TRACT | Status: AC
  Administered 2020-05-30 – 2020-05-31 (×2): 5 mg via INTRAPLEURAL
  Filled 2020-05-30 (×4): qty 5

## 2020-05-30 MED ORDER — SODIUM CHLORIDE (PF) 0.9 % IJ SOLN
10.0000 mg | Freq: Every day | INTRAMUSCULAR | Status: AC
  Administered 2020-05-30 – 2020-05-31 (×2): 10 mg via INTRAPLEURAL
  Filled 2020-05-30 (×4): qty 10

## 2020-05-30 MED ORDER — LORAZEPAM 2 MG/ML IJ SOLN
1.0000 mg | Freq: Once | INTRAMUSCULAR | Status: AC
  Administered 2020-05-30: 1 mg via INTRAVENOUS
  Filled 2020-05-30: qty 1

## 2020-05-30 MED ORDER — SODIUM CHLORIDE 0.9% FLUSH
10.0000 mL | Freq: Three times a day (TID) | INTRAVENOUS | Status: DC
  Administered 2020-05-30 – 2020-06-09 (×22): 10 mL

## 2020-05-30 MED ORDER — FUROSEMIDE 10 MG/ML IJ SOLN
60.0000 mg | Freq: Once | INTRAMUSCULAR | Status: AC
  Administered 2020-05-30: 60 mg via INTRAVENOUS
  Filled 2020-05-30: qty 6

## 2020-05-30 MED ORDER — SODIUM CHLORIDE 0.9 % IV SOLN
1.0000 g | Freq: Three times a day (TID) | INTRAVENOUS | Status: DC
  Administered 2020-05-30 (×2): 1 g via INTRAVENOUS
  Filled 2020-05-30 (×3): qty 1

## 2020-05-30 MED ORDER — CALCIUM GLUCONATE-NACL 1-0.675 GM/50ML-% IV SOLN
1.0000 g | Freq: Once | INTRAVENOUS | Status: AC
  Administered 2020-05-30: 1000 mg via INTRAVENOUS
  Filled 2020-05-30: qty 50

## 2020-05-30 MED ORDER — LINEZOLID 600 MG/300ML IV SOLN
600.0000 mg | Freq: Two times a day (BID) | INTRAVENOUS | Status: DC
  Administered 2020-05-30: 600 mg via INTRAVENOUS
  Filled 2020-05-30 (×2): qty 300

## 2020-05-30 NOTE — Progress Notes (Signed)
      INFECTIOUS DISEASE ATTENDING ADDENDUM:   Date: 05/30/2020  Patient name: Edward Mueller  Medical record number: [id number]  Date of birth: Apr 28, 1970   Patient with MSSA bactermeia with evidence of septic emboli to lungs with  negative TEE now found to have loculated effusion. He was broadened overnight due to his leukocytosis worsening clinical picture he was broadened to meropenem and zyvox. I was alerted to this by pharmacy.  I think we should switch back to cefazolin. MSSA is plenty capable of wreaking havoc and destruction by itself. Note mortality rate for Staphylococcus aureus bacteremia is 20-25% for all comers which should inform just how lethal this pathogen is. Source control and control of metastatic infections is key.     Paulette Blanch Dam 05/30/2020, 5:59 PM

## 2020-05-30 NOTE — Progress Notes (Signed)
This is a progress note  NAME:  Edward Mueller, MRN:  [id number], DOB:  [birth year], LOS: 6 ADMISSION DATE:  05/24/2020, CONSULTATION DATE:  05/24/2020 REFERRING MD: Lockie Mola , CHIEF COMPLAINT: Dyspnea  History of Present Illness:  50 year old man who presented to [facility] on 4/3 with c/o dyspnea.    Reportedly was in his usual state of health until 48 hours prior to admit. 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  On 4/1 he donated plasma.  After that he woke with a "knot on his arm" and felt poorly.  He had malaise, N/V and SOB with associated central chest pain.  He began coughing up bloody sputum.  He was found to have sepsis in the setting of left arm cellulitis, extensive PNA, MSSA bacteremia & AKI.  TEE negative for endocarditis.   Pertinent  Medical History  Tobacco / THC Use   Significant Hospital Events: Including procedures, antibiotic start and stop dates in addition to other pertinent events   . 4/3 Admit with dyspnea, febrile with elevated lactate, imaging consistent with PNA, L arm cellulitis. Negative HIV, COVID, Flu. Cultures positive for MSSA.  Marland Kitchen 4/4 To [facility]  . 4/6 TEE negative for vegetation  . 4/7 PCCM called back for increased O2 needs. O2 weaned to 3L on exam, sats 97%. VBG 7.4 / 42s  Interim History / Subjective:  Seen lying in bed and mild respiratory discomfort and with tachypnea comparable to day prior T-max 4/8 100.9, afebrile as of today Continues to remain on 3 L nasal cannula with adequate oxygenation  Objective   Blood pressure (!) 144/78, pulse (!) 101, temperature 99.1 F (37.3 C), resp. rate (!) 45, height 6\' 3"  (1.905 m), weight 68.9 kg, SpO2 91 %.        Intake/Output Summary (Last 24 hours) at 05/30/2020 1231 Last data filed at 05/30/2020 0400 Gross per 24 hour  Intake 1221.8 ml  Output --  Net 1221.8 ml   Filed Weights   05/24/20 1410 05/29/20 0500  Weight: 68 kg 68.9 kg     Examination: General: Acute on chronic been middle-age male lying in bed in no acute distress HEENT: Crisp/AT, MM pink/moist, PERRL,  Neuro: Alert and oriented x3, nonfocal CV: s1s2 regular rate and rhythm, no murmur, rubs, or gallops,  PULM: Slightly diminished air entry bilaterally, posterior basilar crackles, tachypnea with shallow respirations due to pulmonary splinting GI: soft, bowel sounds active in all 4 quadrants, non-tender, non-distended Extremities: warm/dry, no edema  Skin: no rashes or lesions  Labs/imaging that I Have  . 2CXR 4/7>> Persistent diffuse bilateral nodular and geographic areas of airspace infiltrate. Imaging findings most suggestive of septic emboli Small bilateral pleural effusions.\ . 4/8 WBC up to 25 K, febrile.Pro calcitonin has dropped from 31 to 20.43 over night Net + 5 L . 4/9 chest CT > worsening of aeration of lungs with numerous prominently peripheral areas of nodularity and confluent airspace consolidation interval development of cavitations within multiple areas of airspace consolidation, moderate-sized bilateral pleural effusions  Resolved Hospital Problem list   Septic Shock   Assessment & Plan:   Sepsis secondary to L Arm Cellulitis, MSSA Bacteremia with associated PNA -TEE negative for vegetation  P: ID consulted, appreciate assistance Continue IV cefazolin Supportive care  Acute Hypoxemic Respiratory Failure secondary to MSSA PNA  Tobacco / THC Use  -May have some degree of undiagnosed underlying obstructive disease given smoking  history.   P: Continue supplemental oxygen for SPO2 goal greater than 90 Diuresis given effusion seen on CT Signs of possible loculated effusions as well, will consult IR for possible need of chest tube Continue bronchodilators Aggressive pulmonary hygiene including I-S and flutter valve Adequate pain control to prevent further pulmonary splinting May utilize BiPAP if needed currently stable on nasal  cannula Obtain VBG No acute indications for transfer to ICU or intubation currently Mobilize as able  AKI in setting of Sepsis  -Resolved shock  P: Follow renal function / urine output Trend Bmet Avoid nephrotoxins Ensure adequate renal perfusion  Optimize electrolytes   LUE Cellulitis with Superficial DVT P: Supportive care  Best practice   Diet:  Oral Pain/Anxiety/Delirium protocol (if indicated): No oral pain meds only VAP protocol (if indicated): Not indicated DVT prophylaxis: LMWH GI prophylaxis: N/A Glucose control:  SSI No Central venous access:  N/A Arterial line:  N/A Foley:  N/A Mobility:  OOB  PT consulted: N/A Last date of multidisciplinary goals of care discussion [n/a] Code Status:  full code Disposition: PCU   Delfin Gant, NP-C [facility] Personal contact information can be found on Amion  05/30/2020, 12:43 PM

## 2020-05-30 NOTE — Progress Notes (Addendum)
PROGRESS NOTE  Parag Dorton ONG:[id number] DOB: 10-Nov-1970 DOA: 05/24/2020 PCP: Pcp, No  HPI/Recap of past 37 hours: 50 year old gentleman with no reported medical problems who donates plasma 2 times a week, donated plasma on 05/22/20 and after about 24 hours he started not feeling well.  Reported symptoms included generalized malaise, nausea, vomiting, shortness of breath, pleuritic chest pain, cough with blood-tinged sputum.  Came to the emergency room on 05/24/20, was febrile, tachypneic with lactic acidosis.  Resuscitated and admitted to ICU for closer monitoring then later transferred to progressive care unit. CT chest without contrast done on 05/24/2020 revealed extensive pneumonia, possible septic emboli.  The patient also presented with left arm cellulitis which was treated and has now resolved.  Left upper extremity Doppler ultrasound positive for superficial venous thrombosis.  Work-up revealed MSSA bacteremia for which infectious disease was consulted.  Infective endocarditis was ruled out with TEE done on 05/27/2020.  Due to worsening clinical picture PCCM was consulted.  Chest x-ray done on 05/28/2020 revealed diffuse bilateral pulmonary infiltrates.  Sputum culture collected on 05/29/2020 growing abundant gram-negative rods, abundant gram-positive cocci with few yeast, culture reincubated for better growth.  He has been on cefazolin for MSSA bacteremia.    On the morning of 05/30/2020 due to worsening clinical picture ordered IV Merrem and IV Zyvox however this was discontinued by infectious disease per [facility].  IV cefazolin has been restarted.    CT chest without contrast done on 05/30/2020 showing interval worsening of the aeration of the lungs with numerous predominantly peripheral areas of nodular and confluent airspace consolidation.  Interval development of cavitations within multiple of these areas of airspace consolidation.  These findings are most consistent with worsening multifocal  pneumonia and numerous septic emboli.  Moderate in size bilateral pleural effusions.  Borderline enlarged pre-carinal lymph node,  likely reactive.  Mild cardiomegaly.   Seen by PCCM with plan for thoracentesis and possibly chest tube placement if empyema or loculated pleural effusion is present.  05/30/20: Patient was seen and examined at his bedside this morning.  States he had severe pleuritic pain last night, worse with coughing.  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 Recurrent fever with T-max of 102.5 on 05/30/2020 Worsening leukocytosis 34,000 from 25,000 Negative TEE for infective endocarditis. Repeated blood cultures taken on 05/25/2020 negative, final.  Currently on cefazolin 2 g IV every 8 hours, day #6. Per infectious disease plan for 2 weeks IV cefazolin through 06/07/2020 and then continuation with oral treatment with pulmonary septic emboli findings. Ordered IV Zyvox and Merrem on 05/30/2020 due to worsening clinical picture, discontinued by ID per [facility]. ID and PCCM following.  Worsening acute hypoxic respiratory failure secondary to multifocal pneumonia, bilateral pleural effusions, and extensive bilateral septic pulmonary emboli D-dimer on presentation greater than 4 On IV antibiotics as stated above. Continue to maintain O2 saturation greater than 90% Pulmonary toilette in place  Thoracentesis by IR and possibly chest tube placement if empyema or loculated pleural effusion is present.  Bilateral pleural effusions post right thoracentesis and right thoracostomy tube placement by IR on 05/30/2020. Approximately 300 cc translucent, serosanguineous fluid removed. Management per IR and PCCM. Plan for left  thoracentesis tomorrow 05/31/20.  Hypocalcemia Calcium corrected for albumin 8.5 1 dose calcium  gluconate 1 g x 1. Repeat renal panel in the morning  Resolved Non anion gap metabolic acidosis Serum bicarb 21>> 26.  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 Continue to monitor urine output Repeat renal panel in the morning.  Resolved mild hypovolemic hyponatremia Encourage increase in oral intake Serum sodium 134>> 136. Repeat BMP in the morning  Resolved post repletion: Hypokalemia Serum potassium 3.4>> 3.5. Repleted orally Serum magnesium 2.7 Repeat BMP in the morning  Refractory hypophosphatemia Encourage oral intake Serum phosphorus 2.4>> 2.5>2.4. Replete and repeat level in the morning.  Resolved left upper extremity cellulitis Initially presented with left upper extremity cellulitis which has been treated and has now resolved  Left upper extremity acute superficial vein thrombosis Continue to monitor  THC/tobacco use UDS positive for THC on 05/24/2020 Denies use of cocaine or intravenous drug abuse Cessation counseling when hemodynamically stable.      Code Status: Full code.  Family Communication: Updated his sister at bedside on 05/30/2020.  Disposition Plan: Likely will discharge to home once oxygen requirement is improved and infectious disease/PCCM sign off.   Consultants:  Infectious disease.  PCCM  Procedures:  2D echo on 05/25/2020  TEE on 05/27/2020.  Antimicrobials:  Cefazolin for 05/25/20>>  Merrem/Zyvox 05/30/20, Dced 05/30/20.  DVT prophylaxis: Subcu heparin 3 times daily.  Status is: Inpatient    Dispo: The patient is from: Home.              Anticipated d/c is to: Home.              Patient currently not stable for discharge due to acute hypoxic respiratory failure, management of MSSA bacteremia, worsening multifocal  pneumonia, bilateral pleural effusion, acute hypoxic respiratory failure.   Difficult to place patient: Not applicable.        Objective: Vitals:   05/30/20 1100 05/30/20 1430 05/30/20 1437 05/30/20 1455  BP: (!) 144/78 (!) 149/94 125/75 (!) 142/88  Pulse:    (!) 103  Resp: (!) 45   (!) 40  Temp:    (!) 102.5 F (39.2 C)  TempSrc:    Oral  SpO2:    90%  Weight:      Height:        Intake/Output Summary (Last 24 hours) at 05/30/2020 1548 Last data filed at 05/30/2020 1507 Gross per 24 hour  Intake 1461.8 ml  Output 600 ml  Net 861.8 ml   Filed Weights   05/24/20 1410 05/29/20 0500  Weight: 68 kg 68.9 kg    Exam:  . General: 51 y.o. year-old male weak appearing tachypneic.  Alert oriented x3.   . Cardiovascular: Tachycardic with no rubs or gallops. Marland Kitchen Respiratory: Diffuse rales bilaterally with no wheezing noted.  Poor inspiratory effort.   . Abdomen: Soft nontender no bowel sounds present. . Musculoskeletal: No lower extremity edema bilaterally. . Skin: No ulcerative lesions noted. Marland Kitchen Psychiatry: Mood is appropriate for condition setting.  Data Reviewed: CBC: Recent Labs  Lab 05/24/20 1417 05/24/20 1512 05/26/20 0304 05/27/20 0244 05/28/20 0255 05/29/20 0308 05/30/20 0209  WBC 6.4   < > 5.3 10.9* 16.4* 25.0* 34.8*  NEUTROABS 5.8  --   --   --  12.9*  --   --   HGB 16.8   < > 13.7 12.7* 12.5* 11.9* 12.5*  HCT 48.0   < > 39.1 36.1* 35.8* 33.8* 36.5*  MCV 95.2   < >  91.6 92.3 92.5 92.6 94.6  PLT 207   < > 180 160 PLATELET CLUMPS NOTED ON SMEAR, UNABLE TO ESTIMATE 140* 194   < > = values in this interval not displayed.   Basic Metabolic Panel: Recent Labs  Lab 05/26/20 0304 05/27/20 0244 05/28/20 0255 05/29/20 0308 05/30/20 0209  NA 133* 133* 135 134* 136  K 3.7 3.5 3.7 3.4* 3.5  CL 100 101 102 101 102  CO2 23 25 21* 26 28  GLUCOSE 107* 116* 111* 122* 111*  BUN 42* 46* 48* 44* 38*  CREATININE 2.53* 1.90* 1.50* 1.51* 1.41*  CALCIUM 7.5* 7.6* 7.4*  7.4* 7.4*  MG 2.1 2.8* 3.0* 2.8* 2.7*  PHOS 3.5 2.4* 2.5 2.5 2.4*   GFR: Estimated Creatinine Clearance: 61.1 mL/min (A) (by C-G formula based on SCr of 1.41 mg/dL (H)). Liver Function Tests: Recent Labs  Lab 05/24/20 1417  AST 35  ALT 24  ALKPHOS 56  BILITOT 1.2  PROT 6.0*  ALBUMIN 2.6*   No results for input(s): LIPASE, AMYLASE in the last 168 hours. No results for input(s): AMMONIA in the last 168 hours. Coagulation Profile: Recent Labs  Lab 05/24/20 1417  INR 1.1   Cardiac Enzymes: No results for input(s): CKTOTAL, CKMB, CKMBINDEX, TROPONINI in the last 168 hours. BNP (last 3 results) No results for input(s): PROBNP in the last 8760 hours. HbA1C: No results for input(s): HGBA1C in the last 72 hours. CBG: No results for input(s): GLUCAP in the last 168 hours. Lipid Profile: No results for input(s): CHOL, HDL, LDLCALC, TRIG, CHOLHDL, LDLDIRECT in the last 72 hours. Thyroid Function Tests: No results for input(s): TSH, T4TOTAL, FREET4, T3FREE, THYROIDAB in the last 72 hours. Anemia Panel: No results for input(s): VITAMINB12, FOLATE, FERRITIN, TIBC, IRON, RETICCTPCT in the last 72 hours. Urine analysis:    Component Value Date/Time   COLORURINE YELLOW 05/24/2020 0502   APPEARANCEUR HAZY (A) 05/24/2020 0502   LABSPEC 1.016 05/24/2020 0502   PHURINE 5.0 05/24/2020 0502   GLUCOSEU 50 (A) 05/24/2020 0502   HGBUR MODERATE (A) 05/24/2020 0502   BILIRUBINUR NEGATIVE 05/24/2020 0502   KETONESUR NEGATIVE 05/24/2020 0502   PROTEINUR 100 (A) 05/24/2020 0502   NITRITE NEGATIVE 05/24/2020 0502   LEUKOCYTESUR NEGATIVE 05/24/2020 0502   Sepsis Labs: @LABRCNTIP (procalcitonin:4,lacticidven:4)  ) Recent Results (from the past 240 hour(s))  Blood Culture (routine x 2)     Status: Abnormal   Collection Time: 05/24/20  3:08 PM   Specimen: Left Antecubital; Blood  Result Value Ref Range Status   Specimen Description LEFT ANTECUBITAL  Final   Special Requests   Final     BOTTLES DRAWN AEROBIC AND ANAEROBIC Blood Culture results may not be optimal due to an inadequate volume of blood received in culture bottles   Culture  Setup Time   Final    GRAM POSITIVE COCCI IN CLUSTERS IN BOTH AEROBIC AND ANAEROBIC BOTTLES CRITICAL RESULT CALLED TO, READ BACK BY AND VERIFIED WITH: PHARMD A WOLF 07/24/20 AT 754 AM BY CM Performed at [facility], [address], [place], [place]    Culture STAPHYLOCOCCUS AUREUS (A)  Final   Report Status 05/27/2020 FINAL  Final   Organism ID, Bacteria STAPHYLOCOCCUS AUREUS  Final      Susceptibility   Staphylococcus aureus - MIC*    CIPROFLOXACIN <=0.5 SENSITIVE Sensitive     ERYTHROMYCIN >=8 RESISTANT Resistant     GENTAMICIN <=0.5 SENSITIVE Sensitive     OXACILLIN 0.5 SENSITIVE Sensitive  TETRACYCLINE <=1 SENSITIVE Sensitive     VANCOMYCIN <=0.5 SENSITIVE Sensitive     TRIMETH/SULFA <=10 SENSITIVE Sensitive     CLINDAMYCIN <=0.25 SENSITIVE Sensitive     RIFAMPIN <=0.5 SENSITIVE Sensitive     Inducible Clindamycin NEGATIVE Sensitive     * STAPHYLOCOCCUS AUREUS  Blood Culture ID Panel (Reflexed)     Status: Abnormal   Collection Time: 05/24/20  3:08 PM  Result Value Ref Range Status   Enterococcus faecalis NOT DETECTED NOT DETECTED Final   Enterococcus Faecium NOT DETECTED NOT DETECTED Final   Listeria monocytogenes NOT DETECTED NOT DETECTED Final   Staphylococcus species DETECTED (A) NOT DETECTED Final    Comment: CRITICAL RESULT CALLED TO, READ BACK BY AND VERIFIED WITH: PHARMD A WOLF [id number] AT 800 BY CM    Staphylococcus aureus (BCID) DETECTED (A) NOT DETECTED Final    Comment: CRITICAL RESULT CALLED TO, READ BACK BY AND VERIFIED WITH: PHARMD A WOLF [id number] AT 800 BY CM    Staphylococcus epidermidis NOT DETECTED NOT DETECTED Final   Staphylococcus lugdunensis NOT DETECTED NOT DETECTED Final   Streptococcus species NOT DETECTED NOT DETECTED Final   Streptococcus agalactiae NOT DETECTED NOT DETECTED Final    Streptococcus pneumoniae NOT DETECTED NOT DETECTED Final   Streptococcus pyogenes NOT DETECTED NOT DETECTED Final   A.calcoaceticus-baumannii NOT DETECTED NOT DETECTED Final   Bacteroides fragilis NOT DETECTED NOT DETECTED Final   Enterobacterales NOT DETECTED NOT DETECTED Final   Enterobacter cloacae complex NOT DETECTED NOT DETECTED Final   Escherichia coli NOT DETECTED NOT DETECTED Final   Klebsiella aerogenes NOT DETECTED NOT DETECTED Final   Klebsiella oxytoca NOT DETECTED NOT DETECTED Final   Klebsiella pneumoniae NOT DETECTED NOT DETECTED Final   Proteus species NOT DETECTED NOT DETECTED Final   Salmonella species NOT DETECTED NOT DETECTED Final   Serratia marcescens NOT DETECTED NOT DETECTED Final   Haemophilus influenzae NOT DETECTED NOT DETECTED Final   Neisseria meningitidis NOT DETECTED NOT DETECTED Final   Pseudomonas aeruginosa NOT DETECTED NOT DETECTED Final   Stenotrophomonas maltophilia NOT DETECTED NOT DETECTED Final   Candida albicans NOT DETECTED NOT DETECTED Final   Candida auris NOT DETECTED NOT DETECTED Final   Candida glabrata NOT DETECTED NOT DETECTED Final   Candida krusei NOT DETECTED NOT DETECTED Final   Candida parapsilosis NOT DETECTED NOT DETECTED Final   Candida tropicalis NOT DETECTED NOT DETECTED Final   Cryptococcus neoformans/gattii NOT DETECTED NOT DETECTED Final   Meth resistant mecA/C and MREJ NOT DETECTED NOT DETECTED Final    Comment: Performed at [facility], [address] [place], [place] [address]  Resp Panel by RT-PCR (Flu A&B, Covid) Nasopharyngeal Swab     Status: None   Collection Time: 05/24/20  3:14 PM   Specimen: Nasopharyngeal Swab; Nasopharyngeal(NP) swabs in vial transport medium  Result Value Ref Range Status   SARS Coronavirus 2 by RT PCR NEGATIVE NEGATIVE Final    Comment: (NOTE) SARS-CoV-2 target nucleic acids are NOT DETECTED.  The SARS-CoV-2 RNA is generally detectable in upper respiratory specimens during the  acute phase of infection. The lowest concentration of SARS-CoV-2 viral copies this assay can detect is 138 copies/mL. A negative result does not preclude SARS-Cov-2 infection and should not be used as the sole basis for treatment or other patient management decisions. A negative result may occur with  improper specimen collection/handling, submission of specimen other than nasopharyngeal swab, presence of viral mutation(s) within the areas targeted by this assay, and inadequate  number of viral copies(<138 copies/mL). A negative result must be combined with clinical observations, patient history, and epidemiological information. The expected result is Negative.  Fact Sheet for Patients:  BloggerCourse.com  Fact Sheet for Healthcare Providers:  SeriousBroker.it  This test is no t yet approved or cleared by the [place] FDA and  has been authorized for detection and/or diagnosis of SARS-CoV-2 by FDA under an Emergency Use Authorization (EUA). This EUA will remain  in effect (meaning this test can be used) for the duration of the COVID-19 declaration under Section 564(b)(1) of the Act, 21 U.S.C.section 360bbb-3(b)(1), unless the authorization is terminated  or revoked sooner.       Influenza A by PCR NEGATIVE NEGATIVE Final   Influenza B by PCR NEGATIVE NEGATIVE Final    Comment: (NOTE) The Xpert Xpress SARS-CoV-2/FLU/RSV plus assay is intended as an aid in the diagnosis of influenza from Nasopharyngeal swab specimens and should not be used as a sole basis for treatment. Nasal washings and aspirates are unacceptable for Xpert Xpress SARS-CoV-2/FLU/RSV testing.  Fact Sheet for Patients: BloggerCourse.com  Fact Sheet for Healthcare Providers: SeriousBroker.it  This test is not yet approved or cleared by the [place] FDA and has been authorized for detection and/or  diagnosis of SARS-CoV-2 by FDA under an Emergency Use Authorization (EUA). This EUA will remain in effect (meaning this test can be used) for the duration of the COVID-19 declaration under Section 564(b)(1) of the Act, 21 U.S.C. section 360bbb-3(b)(1), unless the authorization is terminated or revoked.  Performed at [facility], [address], [place], [place] [address]   Blood Culture (routine x 2)     Status: Abnormal   Collection Time: 05/24/20  9:45 PM   Specimen: BLOOD LEFT HAND  Result Value Ref Range Status   Specimen Description BLOOD LEFT HAND  Final   Special Requests   Final    BOTTLES DRAWN AEROBIC AND ANAEROBIC Blood Culture results may not be optimal due to an inadequate volume of blood received in culture bottles   Culture  Setup Time   Final    GRAM POSITIVE COCCI IN CLUSTERS IN BOTH AEROBIC AND ANAEROBIC BOTTLES CRITICAL VALUE NOTED.  VALUE IS CONSISTENT WITH PREVIOUSLY REPORTED AND CALLED VALUE.    Culture (A)  Final    STAPHYLOCOCCUS AUREUS SUSCEPTIBILITIES PERFORMED ON PREVIOUS CULTURE WITHIN THE LAST 5 DAYS. Performed at [facility], [address] [place], [place] [address]    Report Status 05/27/2020 FINAL  Final  MRSA PCR Screening     Status: None   Collection Time: 05/24/20 11:46 PM   Specimen: Nasopharyngeal  Result Value Ref Range Status   MRSA by PCR NEGATIVE NEGATIVE Final    Comment:        The GeneXpert MRSA Assay (FDA approved for NASAL specimens only), is one component of a comprehensive MRSA colonization surveillance program. It is not intended to diagnose MRSA infection nor to guide or monitor treatment for MRSA infections. Performed at [facility], [address], [place], [place] [address]   Urine culture     Status: None   Collection Time: 05/24/20 11:59 PM   Specimen: In/Out Cath Urine  Result Value Ref Range Status   Specimen Description IN/OUT CATH URINE  Final   Special Requests NONE  Final   Culture    Final    NO GROWTH Performed at [facility], [address] [place], [place] [address]  Report Status 05/26/2020 FINAL  Final  Culture, blood (routine x 2)     Status: None   Collection Time: 05/25/20 10:19 PM   Specimen: BLOOD LEFT HAND  Result Value Ref Range Status   Specimen Description BLOOD LEFT HAND  Final   Special Requests   Final    BOTTLES DRAWN AEROBIC AND ANAEROBIC Blood Culture adequate volume   Culture   Final    NO GROWTH 5 DAYS Performed at [facility], [address], [place], [place] [address]    Report Status 05/30/2020 FINAL  Final  Culture, blood (routine x 2)     Status: None   Collection Time: 05/25/20 10:32 PM   Specimen: BLOOD RIGHT HAND  Result Value Ref Range Status   Specimen Description BLOOD RIGHT HAND  Final   Special Requests AEROBIC BOTTLE ONLY Blood Culture adequate volume  Final   Culture   Final    NO GROWTH 5 DAYS Performed at [facility], [address] [place], [place] [address]    Report Status 05/30/2020 FINAL  Final  Expectorated Sputum Assessment w Gram Stain, Rflx to Resp Cult     Status: None   Collection Time: 05/29/20  7:11 AM   Specimen: Expectorated Sputum  Result Value Ref Range Status   Specimen Description EXPECTORATED SPUTUM  Final   Special Requests Normal  Final   Sputum evaluation   Final    THIS SPECIMEN IS ACCEPTABLE FOR SPUTUM CULTURE Performed at [facility], [address] [place], [place] [address]    Report Status 05/29/2020 FINAL  Final  Culture, Respiratory w Gram Stain     Status: None (Preliminary result)   Collection Time: 05/29/20  7:11 AM  Result Value Ref Range Status   Specimen Description EXPECTORATED SPUTUM  Final   Special Requests Normal Reflexed from [id number]  Final   Gram Stain   Final    ABUNDANT WBC PRESENT,BOTH PMN AND MONONUCLEAR FEW SQUAMOUS EPITHELIAL CELLS PRESENT ABUNDANT GRAM NEGATIVE RODS ABUNDANT GRAM POSITIVE COCCI FEW YEAST    Culture   Final     CULTURE REINCUBATED FOR BETTER GROWTH Performed at [facility], [address] [place], [place] [address]    Report Status PENDING  Incomplete      Studies: CT CHEST WO CONTRAST  Result Date: 05/30/2020 CLINICAL DATA:  Respiratory failure. Staph aureus bacteremia. EXAM: CT CHEST WITHOUT CONTRAST TECHNIQUE: Multidetector CT imaging of the chest was performed following the standard protocol without IV contrast. COMPARISON:  May 24, 2020 FINDINGS: Cardiovascular: Mildly enlarged cardiac silhouette. No sizable pericardial effusion. Mediastinum/Nodes: Borderline enlarged precarinal lymph node measures 10 mm in short axis, likely reactive. Other shotty mediastinal lymph nodes are present. Trachea and main bronchi are patent. The esophagus is normal. Lungs/Pleura: Interval worsening of the aeration of the lungs with numerous predominantly peripheral areas of nodular airspace consolidation, some confluent in the right upper lobe, lingula and bilateral lower lobes. There has been interval development of cavitations within multiple of these areas of airspace consolidation. There are moderate in size bilateral pleural effusions. Overall the aeration of the lungs has worsened. Upper Abdomen: No acute abnormality. Musculoskeletal: No chest wall mass or suspicious bone lesions identified. IMPRESSION: 1. Interval worsening of the aeration of the lungs with numerous predominantly peripheral areas of nodular and confluent airspace consolidation. Interval development of cavitations within multiple of these areas of airspace consolidation. These findings are most consistent with worsening multifocal pneumonia and  numerous septic emboli. 2. Moderate in size bilateral pleural effusions. 3. Borderline enlarged precarinal lymph node, likely reactive. 4. Mild cardiomegaly. Electronically Signed   By: Ted Mcalpine M.D.   On: 05/30/2020 12:28   DG CHEST PORT 1 VIEW  Result Date: 05/30/2020 CLINICAL DATA:   Shortness of breath. EXAM: PORTABLE CHEST 1 VIEW COMPARISON:  05/28/2020 FINDINGS: Diffuse patchy and nodular bilateral airspace disease is stable. Some of the nodules are cavitated. The cardiopericardial silhouette is within normal limits for size. Pleural drain noted over the right lower hemithorax. Telemetry leads overlie the chest. IMPRESSION: No substantial interval change diffuse bilateral patchy and nodular airspace disease bilaterally. Electronically Signed   By: Kennith Center M.D.   On: 05/30/2020 15:29    Scheduled Meds: . alteplase (TPA) for intrapleural administration  10 mg Intrapleural Daily   And  . pulmozyme (DORNASE) for intrapleural administration  5 mg Intrapleural Daily  . heparin  5,000 Units Subcutaneous Q8H  . sodium chloride flush  10 mL Intracatheter Q8H    Continuous Infusions: .  ceFAZolin (ANCEF) IV 2 g (05/30/20 1507)     LOS: 6 days     Darlin Drop, MD [facility] Pager [phone]  If 7PM-7AM, please contact night-coverage [email or web address] Password [facility] 05/30/2020, 3:48 PM

## 2020-05-30 NOTE — Progress Notes (Signed)
Pharmacy Antibiotic Note  Edward Mueller is a 50 y.o. male admitted on 05/24/2020 with shortness of breath.  Pharmacy has been consulted for Merrem dosing in setting of increasing WBC. Tmax 100.9. Renal function is improving. Already on Ancef for MSSA bacteremia. Sputum culture from yesterday also showing GNR/GPC.   4/3 Blood>>MSSA 4/8 Sputum>>Abundant GNR, GPC  Plan: Merrem 1g IV q8h Trend WBC, temp, renal function F/U speciation on sputum culture ID is following   Height: 6\' 3"  (190.5 cm) Weight: 68.9 kg (151 lb 14.4 oz) IBW/kg (Calculated) : 84.5  Temp (24hrs), Avg:100 F (37.8 C), Min:99.1 F (37.3 C), Max:100.9 F (38.3 C)  Recent Labs  Lab 05/24/20 1417 05/24/20 1630 05/24/20 2146 05/25/20 0218 05/26/20 0304 05/27/20 0244 05/28/20 0255 05/29/20 0308 05/30/20 0209  WBC 6.4  --   --    < > 5.3 10.9* 16.4* 25.0* 34.8*  CREATININE 2.68*  --   --    < > 2.53* 1.90* 1.50* 1.51* 1.41*  LATICACIDVEN 3.2* 3.7* 3.1*  --   --   --  1.0  --   --    < > = values in this interval not displayed.    Estimated Creatinine Clearance: 61.1 mL/min (A) (by C-G formula based on SCr of 1.41 mg/dL (H)).    No Known Allergies  07/30/20, PharmD, BCPS Clinical Pharmacist Phone: [phone]

## 2020-05-30 NOTE — Progress Notes (Signed)
   05/30/20 0945  Assess: MEWS Score  Temp 99.1 F (37.3 C)  BP 133/84  Pulse Rate (!) 101  ECG Heart Rate (!) 101  Resp (!) 44  Level of Consciousness Alert  SpO2 91 %  O2 Device Nasal Cannula  Patient Activity (if Appropriate) In bed  O2 Flow Rate (L/min) 4 L/min  Assess: MEWS Score  MEWS Temp 0  MEWS Systolic 0  MEWS Pulse 1  MEWS RR 3  MEWS LOC 0  MEWS Score 4  MEWS Score Color Red  Assess: if the MEWS score is Yellow or Red  Were vital signs taken at a resting state? Yes  Focused Assessment No change from prior assessment  Early Detection of Sepsis Score *See Row Information* High  MEWS guidelines implemented *See Row Information* Yes  Treat  MEWS Interventions Administered scheduled meds/treatments  Pain Scale 0-10  Pain Score 0  Patients Stated Pain Goal 0  Take Vital Signs  Increase Vital Sign Frequency  Red: Q 1hr X 4 then Q 4hr X 4, if remains red, continue Q 4hrs  Escalate  MEWS: Escalate Red: discuss with charge nurse/RN and provider, consider discussing with RRT  Notify: Charge Nurse/RN  Name of Charge Nurse/RN Notified Bonita Quin  Date Charge Nurse/RN Notified 05/30/20  Time Charge Nurse/RN Notified [id number]  Document  Patient Outcome Stabilized after interventions   Pt RR has been 30-40's. MD aware. Pt is stable at this time.

## 2020-05-30 NOTE — Procedures (Signed)
Interventional Radiology Procedure Note  Procedure: Ultrasound guided right thoracostomy tube placement  Findings: Please refer to procedural dictation for full description. Mildly complex, small to moderate right pleural effusion.  10.2 Fr pigtail thoracostomy tube placed in right base.  Approximately 300 mL translucent, serosanguinous fluid removed into pleurevac immediately.  Simple appearing, small left pleural effusion also noted.  Complications: None immediate  Estimated Blood Loss: < 5 mL  Recommendations: Keep tube to -20 mmHg until at least tomorrow morning. Follow up post-tube placement CXR.   Plan for left thoracentesis tomorrow. IR will follow along with Pulmonary/Critical Care.   Marliss Coots, MD Pager: [phone]

## 2020-05-30 NOTE — Procedures (Signed)
Pleural Fibrinolytic Administration Procedure Note  Edward Mueller  [id number]  26-Oct-1970  Date:05/30/20  Time:3:34 PM   Provider Performing:Michaelina Blandino Demetrius Charity Chestine Spore   Procedure: Pleural Fibrinolysis Initial day [phone])  Indication(s) Fibrinolysis of complicated pleural effusion  Consent Risks of the procedure as well as the alternatives and risks of each were explained to the patient and/or caregiver.  Consent for the procedure was obtained.   Anesthesia None   Time Out Verified patient identification, verified procedure, site/side was marked, verified correct patient position, special equipment/implants available, medications/allergies/relevant history reviewed, required imaging and test results available.   Sterile Technique Hand hygiene, gloves   Procedure Description Existing pleural catheter was cleaned and accessed in sterile manner.  10mg  of tPA in 30cc of saline and 5mg  of dornase in 30cc of sterile water were injected into pleural space using existing pleural catheter.  Catheter will be clamped for 1 hour and then placed back to suction. Reviewed with RNs at bedside.   Complications/Tolerance None; patient tolerated the procedure well.   EBL None   Specimen(s) None  , DO 05/30/20 3:35 PM [facility]

## 2020-05-30 NOTE — Consult Note (Signed)
Chief Complaint: Patient was seen in consultation today for right pleural effusion/thoracentesis versus right chest tube placement.  Referring Physician(s): Steffanie Dunn (CCM)  Supervising Physician: Marliss Coots  Patient Status: [facility] - In-pt  History of Present Illness: Edward Mueller is a 50 y.o. male with a past medical history of tobacco abuse. He has been admitted to [facility] since 05/24/2020 for management of sepsis secondary to left arm cellulitis, extensive pneumonia, MSSA bateremia, and AKI. Bilateral pneumonia complicated by bilateral pleural effusions, R>L.  CT chest today: 1. Interval worsening of the aeration of the lungs with numerous predominantly peripheral areas of nodular and confluent airspace consolidation. Interval development of cavitations within multiple of these areas of airspace consolidation. These findings are most consistent with worsening multifocal pneumonia and numerous septic emboli. 2. Moderate in size bilateral pleural effusions. 3. Borderline enlarged precarinal lymph node, likely reactive. 4. Mild cardiomegaly.  IR consulted by Dr. Chestine Spore for management of bilateral pleural effusions. Patient awake and alert sitting in bed. Sister and mother at bedside. Complains of dyspnea. Sating 92% on 4L [place]. Tachypneic. Denies fever, chills, chest pain, abdominal pain, or headache.   History reviewed. No pertinent past medical history.  Past Surgical History:  Procedure Laterality Date  . TEE WITHOUT CARDIOVERSION N/A 05/27/2020   Procedure: TRANSESOPHAGEAL ECHOCARDIOGRAM (TEE);  Surgeon: Thurmon Fair, MD;  Location: [facility] ENDOSCOPY;  Service: Cardiovascular;  Laterality: N/A;    Allergies: Patient has no known allergies.  Medications: Prior to Admission medications   Not on File     History reviewed. No pertinent family history.  Social History   Socioeconomic History  . Marital status: Single    Spouse name: Not on file  . Number of children:  Not on file  . Years of education: Not on file  . Highest education level: Not on file  Occupational History  . Not on file  Tobacco Use  . Smoking status: Current Every Day Smoker    Years: 20.00  . Smokeless tobacco: Never Used  Vaping Use  . Vaping Use: Unknown  Substance and Sexual Activity  . Alcohol use: Not Currently  . Drug use: Never  . Sexual activity: Yes  Other Topics Concern  . Not on file  Social History Narrative  . Not on file   Social Determinants of Health   Financial Resource Strain: Not on file  Food Insecurity: Not on file  Transportation Needs: Not on file  Physical Activity: Not on file  Stress: Not on file  Social Connections: Not on file     Review of Systems: A 12 point ROS discussed and pertinent positives are indicated in the HPI above.  All other systems are negative.  Review of Systems  Constitutional: Negative for chills and fever.  Respiratory: Positive for shortness of breath. Negative for wheezing.   Cardiovascular: Negative for chest pain and palpitations.  Gastrointestinal: Negative for abdominal pain.  Neurological: Negative for headaches.  Psychiatric/Behavioral: Negative for behavioral problems and confusion.    Vital Signs: BP (!) 144/78   Pulse (!) 101   Temp 99.1 F (37.3 C)   Resp (!) 45   Ht  (1.905 m)   Wt 151 lb 14.4 oz (68.9 kg)   SpO2 91%   BMI 18.99 kg/m   Physical Exam Vitals and nursing note reviewed.  Constitutional:      General: He is not in acute distress. Cardiovascular:     Rate and Rhythm: Normal rate and regular rhythm.  Heart sounds: Normal heart sounds. No murmur heard.   Pulmonary:     Effort: No respiratory distress.     Comments: Tachypneic. Decreased breath sounds bilaterally. Skin:    General: Skin is warm and dry.  Neurological:     Mental Status: He is alert and oriented to person, place, and time.       Imaging: CT HEAD WO CONTRAST  Result Date:  05/27/2020 CLINICAL DATA:  Mental status change, CNS infection suspected EXAM: CT HEAD WITHOUT CONTRAST TECHNIQUE: Contiguous axial images were obtained from the base of the skull through the vertex without intravenous contrast. COMPARISON:  None. FINDINGS: Brain: No evidence of large-territorial acute infarction. No parenchymal hemorrhage. No mass lesion. No extra-axial collection. No mass effect or midline shift. No hydrocephalus. Basilar cisterns are patent. Vascular: No hyperdense vessel. Skull: No acute fracture or focal lesion. Sinuses/Orbits: Paranasal sinuses and mastoid air cells are clear. The orbits are unremarkable. Other: None. IMPRESSION: No acute intracranial abnormality. Electronically Signed   By: Tish Frederickson M.D.   On: 05/27/2020 19:45   CT CHEST WO CONTRAST  Result Date: 05/30/2020 CLINICAL DATA:  Respiratory failure. Staph aureus bacteremia. EXAM: CT CHEST WITHOUT CONTRAST TECHNIQUE: Multidetector CT imaging of the chest was performed following the standard protocol without IV contrast. COMPARISON:  May 24, 2020 FINDINGS: Cardiovascular: Mildly enlarged cardiac silhouette. No sizable pericardial effusion. Mediastinum/Nodes: Borderline enlarged precarinal lymph node measures 10 mm in short axis, likely reactive. Other shotty mediastinal lymph nodes are present. Trachea and main bronchi are patent. The esophagus is normal. Lungs/Pleura: Interval worsening of the aeration of the lungs with numerous predominantly peripheral areas of nodular airspace consolidation, some confluent in the right upper lobe, lingula and bilateral lower lobes. There has been interval development of cavitations within multiple of these areas of airspace consolidation. There are moderate in size bilateral pleural effusions. Overall the aeration of the lungs has worsened. Upper Abdomen: No acute abnormality. Musculoskeletal: No chest wall mass or suspicious bone lesions identified. IMPRESSION: 1. Interval worsening of  the aeration of the lungs with numerous predominantly peripheral areas of nodular and confluent airspace consolidation. Interval development of cavitations within multiple of these areas of airspace consolidation. These findings are most consistent with worsening multifocal pneumonia and numerous septic emboli. 2. Moderate in size bilateral pleural effusions. 3. Borderline enlarged precarinal lymph node, likely reactive. 4. Mild cardiomegaly. Electronically Signed   By: Ted Mcalpine M.D.   On: 05/30/2020 12:28   CT Chest Wo Contrast  Result Date: 05/24/2020 CLINICAL DATA:  Chest pain and shortness of breath. EXAM: CT CHEST WITHOUT CONTRAST TECHNIQUE: Multidetector CT imaging of the chest was performed following the standard protocol without IV contrast. COMPARISON:  None. FINDINGS: Cardiovascular: No significant vascular findings. Normal heart size. No pericardial effusion. Mediastinum/Nodes: No enlarged mediastinal or axillary lymph nodes. Thyroid gland, trachea, and esophagus demonstrate no significant findings. Lungs/Pleura: Innumerable ill-defined bilateral noncalcified nodular appearing areas are seen throughout both lungs. Marked severity, patchy anteromedial left upper lobe and bilateral lower lobe infiltrates are present. A very small left pleural effusion is seen. No pneumothorax is identified. Upper Abdomen: No acute abnormality. Musculoskeletal: No chest wall mass or suspicious bone lesions identified. IMPRESSION: 1. Marked severity left upper lobe and bilateral lower lobe infiltrates with innumerable bilateral nodular appearing areas, as described above. While this may be, in part, infectious in etiology, sequelae associated with pulmonary metastasis cannot be excluded. 2. Very small left pleural effusion. Electronically Signed   By: Aram Candela  M.D.   On: 05/24/2020 18:19   US RENAL  Result Date: 05/26/2020 CLINICAL DATA:  Acute kidney injury EXAM: RENAL / URINARY TRACT ULTRASOUND  COMPLETE COMPARISON:  None. FINDINGS: Right Kidney: Renal measurements: 13.1 x 5.1 x 4.7 cm = volume: 166 mL. Echogenicity within normal limits. No mass or hydronephrosis visualized. Left Kidney: Renal measurements: 12.6 x 6.0 x 4.0 cm = volume: 161 mL. Mild hydronephrosis. Normal echotexture. No mass. Bladder: Appears normal for degree of bladder distention. Other: None. IMPRESSION: Mild left hydronephrosis. Electronically Signed   By: Charlett Nose M.D.   On: 05/26/2020 20:30   DG CHEST PORT 1 VIEW  Result Date: 05/28/2020 CLINICAL DATA:  Short of breath, hypoxia EXAM: PORTABLE CHEST 1 VIEW COMPARISON:  Prior chest x-ray 05/26/2020; CT chest 05/24/2020 FINDINGS: Extensive bilateral nodular opacities and areas of geographic pulmonary airspace opacity. Probable small bilateral pleural effusions. Stable cardiac and mediastinal contours. No evidence of pneumothorax. No significant interval change compared to 05/26/2020. No acute osseous abnormality. IMPRESSION: No significant interval change in the appearance of the chest compared to 05/26/2020. Persistent diffuse bilateral nodular and geographic areas of airspace infiltrate. Imaging findings most suggestive of septic emboli. Small bilateral pleural effusions. Electronically Signed   By: Malachy Moan M.D.   On: 05/28/2020 11:13   DG CHEST PORT 1 VIEW  Result Date: 05/26/2020 CLINICAL DATA:  Shortness of breath EXAM: PORTABLE CHEST 1 VIEW COMPARISON:  May 24, 2020 chest radiograph and chest CT FINDINGS: Multiple nodular opacities are seen throughout the lungs, appreciable on recent CT but not appreciable on chest radiograph from 2 days prior. No areas of cavitation evident. Airspace consolidation is noted in portions of each lung base with equivocal pleural effusions bilaterally. Heart size and pulmonary vascular normal. No adenopathy appreciable by radiography. No bone lesions. IMPRESSION: Airspace opacity in the lung bases consistent with pneumonia. Small  pleural effusions bilaterally. Nodular opacities throughout the lungs with increased conspicuity compared to chest radiograph 2 days prior. Question multiple septic emboli, although no cavitation evident. Underlying neoplasm is also possible. Heart size normal.  No adenopathy appreciable by radiography. Electronically Signed   By: Bretta Bang III M.D.   On: 05/26/2020 09:49   DG Chest Port 1 View  Result Date: 05/24/2020 CLINICAL DATA:  Sepsis.  LEFT arm plain EXAM: PORTABLE CHEST 1 VIEW COMPARISON:  06/12/2007 FINDINGS: Normal cardiac silhouette. There is bibasilar airspace disease in perihilar distribution. Nodule in the LEFT upper lobe. Small effusions. No pneumothorax. IMPRESSION: Findings most consistent with multilobar pneumonia. LEFT upper lobe nodularity. Followup PA and lateral chest X-ray is recommended in 3-4 weeks following trial of antibiotic therapy to ensure resolution and exclude underlying malignancy. Electronically Signed   By: Genevive Bi M.D.   On: 05/24/2020 15:53   ECHOCARDIOGRAM COMPLETE  Result Date: 05/25/2020    ECHOCARDIOGRAM REPORT   Patient Name:   CURRAN LENDERMAN Date of Exam: 05/25/2020 Medical Rec #:  [id number]      Height:       75.0 in Accession #:    [id number]     Weight:       150.0 lb Date of Birth:  30-Dec-1970      BSA:          1.942 m Patient Age:    49 years       BP:           117/82 mmHg Patient Gender: M  HR:           99 bpm. Exam Location:  Inpatient Procedure: 2D Echo, Cardiac Doppler, Color Doppler and Intracardiac            Opacification Agent Indications:    Endocarditis  History:        Patient has no prior history of Echocardiogram examinations.  Sonographer:    Neomia DearAMARA CROWN RDCS Referring Phys: [id number] RAVI AGARWALA IMPRESSIONS  1. Abnormal septal motion . Left ventricular ejection fraction, by estimation, is 45 to 50%. The left ventricle has mildly decreased function. The left ventricle has no regional wall motion abnormalities. Left  ventricular diastolic parameters were normal.  2. Right ventricular systolic function is normal. The right ventricular size is normal.  3. The mitral valve is normal in structure. No evidence of mitral valve regurgitation. No evidence of mitral stenosis.  4. The aortic valve is tricuspid. Aortic valve regurgitation is not visualized. No aortic stenosis is present.  5. The inferior vena cava is normal in size with greater than 50% respiratory variability, suggesting right atrial pressure of 3 mmHg. FINDINGS  Left Ventricle: Abnormal septal motion. Left ventricular ejection fraction, by estimation, is 45 to 50%. The left ventricle has mildly decreased function. The left ventricle has no regional wall motion abnormalities. Definity contrast agent was given IV  to delineate the left ventricular endocardial borders. The left ventricular internal cavity size was normal in size. There is no left ventricular hypertrophy. Left ventricular diastolic parameters were normal. Right Ventricle: The right ventricular size is normal. No increase in right ventricular wall thickness. Right ventricular systolic function is normal. Left Atrium: Left atrial size was normal in size. Right Atrium: Right atrial size was normal in size. Pericardium: There is no evidence of pericardial effusion. Mitral Valve: The mitral valve is normal in structure. There is mild thickening of the mitral valve leaflet(s). There is mild calcification of the mitral valve leaflet(s). No evidence of mitral valve regurgitation. No evidence of mitral valve stenosis. Tricuspid Valve: The tricuspid valve is normal in structure. Tricuspid valve regurgitation is not demonstrated. No evidence of tricuspid stenosis. Aortic Valve: The aortic valve is tricuspid. Aortic valve regurgitation is not visualized. No aortic stenosis is present. Aortic valve mean gradient measures 4.5 mmHg. Aortic valve peak gradient measures 7.3 mmHg. Aortic valve area, by VTI measures 2.67 cm.  Pulmonic Valve: The pulmonic valve was normal in structure. Pulmonic valve regurgitation is not visualized. No evidence of pulmonic stenosis. Aorta: The aortic root is normal in size and structure. Venous: The inferior vena cava is normal in size with greater than 50% respiratory variability, suggesting right atrial pressure of 3 mmHg. IAS/Shunts: No atrial level shunt detected by color flow Doppler.  LEFT VENTRICLE PLAX 2D LVIDd:         5.10 cm      Diastology LVIDs:         4.20 cm      LV e' medial:    7.72 cm/s LV PW:         0.70 cm      LV E/e' medial:  10.0 LV IVS:        0.60 cm      LV e' lateral:   16.20 cm/s LVOT diam:     2.30 cm      LV E/e' lateral: 4.8 LV SV:         55 LV SV Index:   28 LVOT Area:     4.15 cm  LV Volumes (MOD) LV vol d, MOD A2C: 61.7 ml LV vol d, MOD A4C: 103.0 ml LV vol s, MOD A4C: 69.0 ml LV SV MOD A4C:     103.0 ml RIGHT VENTRICLE RV S prime:     13.40 cm/s  PULMONARY VEINS TAPSE (M-mode): 2.4 cm      A Reversal Duration: 90.00 msec                             A Reversal Velocity: 29.80 cm/s                             Diastolic Velocity:  49.40 cm/s                             S/D Velocity:        0.80                             Systolic Velocity:   40.60 cm/s LEFT ATRIUM             Index       RIGHT ATRIUM           Index LA diam:        3.30 cm 1.70 cm/m  RA Area:     14.10 cm LA Vol (A2C):   45.8 ml 23.59 ml/m RA Volume:   37.50 ml  19.31 ml/m LA Vol (A4C):   57.1 ml 29.40 ml/m LA Biplane Vol: 53.0 ml 27.29 ml/m  AORTIC VALVE                    PULMONIC VALVE AV Area (Vmax):    2.60 cm     PV Vmax:       0.94 m/s AV Area (Vmean):   2.33 cm     PV Vmean:      78.000 cm/s AV Area (VTI):     2.67 cm     PV VTI:        0.157 m AV Vmax:           135.00 cm/s  PV Peak grad:  3.5 mmHg AV Vmean:          101.100 cm/s PV Mean grad:  3.0 mmHg AV VTI:            0.207 m AV Peak Grad:      7.3 mmHg AV Mean Grad:      4.5 mmHg LVOT Vmax:         84.50 cm/s LVOT Vmean:         56.700 cm/s LVOT VTI:          0.133 m LVOT/AV VTI ratio: 0.64  AORTA Ao Root diam: 3.20 cm Ao Asc diam:  2.80 cm MITRAL VALVE MV Area (PHT): 6.83 cm    SHUNTS MV Decel Time: 111 msec    Systemic VTI:  0.13 m MV E velocity: 77.10 cm/s  Systemic Diam: 2.30 cm MV A velocity: 81.20 cm/s MV E/A ratio:  0.95 Charlton Haws MD Electronically signed by Charlton Haws MD Signature Date/Time: 05/25/2020/9:09:26 AM    Final    ECHO TEE  Result Date: 05/27/2020    TRANSESOPHOGEAL ECHO REPORT   Patient Name:   [name] Date of Exam: 05/27/2020 Medical Rec #:  [id number]      Height:       75.0 in Accession #:    [id number]     Weight:       150.0 lb Date of Birth:  Nov 15, 1970      BSA:          1.942 m Patient Age:    49 years       BP:           106/59 mmHg Patient Gender: M              HR:           104 bpm. Exam Location:  Inpatient Procedure: Transesophageal Echo, Color Doppler and Cardiac Doppler Indications:     Bacteremia R78.81  History:         Patient has prior history of Echocardiogram examinations, most                  recent 05/25/2020.  Sonographer:     Lavenia Atlas Referring Phys:  [id number] Dorcas Carrow Diagnosing Phys: Thurmon Fair MD PROCEDURE: After discussion of the risks and benefits of a TEE, an informed consent was obtained. The transesophogeal probe was passed without difficulty through the esophogus of the patient. Sedation performed by different physician. The patient was monitored while under deep sedation. Anesthestetic sedation was provided intravenously by Anesthesiology: 234.6mg  of Propofol. The patient's vital signs; including heart rate, blood pressure, and oxygen saturation; remained stable throughout the procedure. The patient developed no complications during the procedure. IMPRESSIONS  1. Left ventricular ejection fraction, by estimation, is 60 to 65%. The left ventricle has normal function. The left ventricle has no regional wall motion abnormalities.  2. Right ventricular systolic  function is normal. The right ventricular size is normal.  3. No left atrial/left atrial appendage thrombus was detected.  4. The mitral valve is normal in structure. Trivial mitral valve regurgitation. No evidence of mitral stenosis.  5. The aortic valve is normal in structure. Aortic valve regurgitation is not visualized. No aortic stenosis is present.  6. The inferior vena cava is normal in size with greater than 50% respiratory variability, suggesting right atrial pressure of 3 mmHg. Conclusion(s)/Recommendation(s): Normal biventricular function without evidence of hemodynamically significant valvular heart disease. No evidence of vegetation/infective endocarditis on this transesophageal echocardiogram. FINDINGS  Left Ventricle: Left ventricular ejection fraction, by estimation, is 60 to 65%. The left ventricle has normal function. The left ventricle has no regional wall motion abnormalities. The left ventricular internal cavity size was normal in size. There is  no left ventricular hypertrophy. Right Ventricle: The right ventricular size is normal. No increase in right ventricular wall thickness. Right ventricular systolic function is normal. Left Atrium: Left atrial size was normal in size. No left atrial/left atrial appendage thrombus was detected. Right Atrium: Right atrial size was normal in size. Pericardium: There is no evidence of pericardial effusion. Mitral Valve: The mitral valve is normal in structure. Trivial mitral valve regurgitation. No evidence of mitral valve stenosis. Tricuspid Valve: The tricuspid valve is normal in structure. Tricuspid valve regurgitation is not demonstrated. No evidence of tricuspid stenosis. Aortic Valve: The aortic valve is normal in structure. Aortic valve regurgitation is not visualized. No aortic stenosis is present. Pulmonic Valve: The pulmonic valve was normal in structure. Pulmonic valve regurgitation is not visualized. No evidence of pulmonic stenosis. Aorta: The  aortic root is normal in size and structure. Venous: The inferior vena cava is normal in size with greater  than 50% respiratory variability, suggesting right atrial pressure of 3 mmHg. IAS/Shunts: No atrial level shunt detected by color flow Doppler. Thurmon Fair MD Electronically signed by Thurmon Fair MD Signature Date/Time: 05/27/2020/5:10:51 PM    Final    VAS [place] UPPER EXTREMITY VENOUS DUPLEX  Result Date: 05/25/2020 UPPER VENOUS STUDY  Indications: Pain, swelling, redness (linear pattern) Risk Factors: Donates plasma regularly. Comparison Study: No previous exams Performing Technologist: Ernestene Mention  Examination Guidelines: A complete evaluation includes B-mode imaging, spectral Doppler, color Doppler, and power Doppler as needed of all accessible portions of each vessel. Bilateral testing is considered an integral part of a complete examination. Limited examinations for reoccurring indications may be performed as noted.  Right Findings: +----------+------------+---------+-----------+----------+-------+ RIGHT     CompressiblePhasicitySpontaneousPropertiesSummary +----------+------------+---------+-----------+----------+-------+ Subclavian    Full       Yes       Yes                      +----------+------------+---------+-----------+----------+-------+  Left Findings: +----------+------------+---------+-----------+----------+-------+ LEFT      CompressiblePhasicitySpontaneousPropertiesSummary +----------+------------+---------+-----------+----------+-------+ IJV           Full       Yes       Yes                      +----------+------------+---------+-----------+----------+-------+ Subclavian    Full       Yes       Yes                      +----------+------------+---------+-----------+----------+-------+ Axillary      Full       Yes       Yes                      +----------+------------+---------+-----------+----------+-------+ Brachial      Full       Yes        Yes                      +----------+------------+---------+-----------+----------+-------+ Radial        Full                                          +----------+------------+---------+-----------+----------+-------+ Ulnar         Full                                          +----------+------------+---------+-----------+----------+-------+ Cephalic      None       No        No                Acute  +----------+------------+---------+-----------+----------+-------+ Basilic       Full       Yes       Yes                      +----------+------------+---------+-----------+----------+-------+  Summary:  Right: No evidence of thrombosis in the subclavian.  Left: No evidence of deep vein thrombosis in the upper extremity. Findings consistent with acute superficial vein thrombosis involving the left cephalic vein.  *See table(s) above for measurements and observations.  Diagnosing physician: Fabienne Bruns MD Electronically signed by  Fabienne Bruns MD on 05/25/2020 at 3:00:55 PM.    Final     Labs:  CBC: Recent Labs    05/27/20 0244 05/28/20 0255 05/29/20 0308 05/30/20 0209  WBC 10.9* 16.4* 25.0* 34.8*  HGB 12.7* 12.5* 11.9* 12.5*  HCT 36.1* 35.8* 33.8* 36.5*  PLT 160 PLATELET CLUMPS NOTED ON SMEAR, UNABLE TO ESTIMATE 140* 194    COAGS: Recent Labs    05/24/20 1417  INR 1.1  APTT 28    BMP: Recent Labs    05/27/20 0244 05/28/20 0255 05/29/20 0308 05/30/20 0209  NA 133* 135 134* 136  K 3.5 3.7 3.4* 3.5  CL 101 102 101 102  CO2 25 21* 26 28  GLUCOSE 116* 111* 122* 111*  BUN 46* 48* 44* 38*  CALCIUM 7.6* 7.4* 7.4* 7.4*  CREATININE 1.90* 1.50* 1.51* 1.41*  GFRNONAA 42* 56* 56* >60    LIVER FUNCTION TESTS: Recent Labs    05/24/20 1417  BILITOT 1.2  AST 35  ALT 24  ALKPHOS 56  PROT 6.0*  ALBUMIN 2.6*     Assessment and Plan:  MSSA bacteremia secondary to bilateral pneumonia complicated by bilateral pleural effusions (R>L). Plan for  image-guided right thoracentesis versus right chest tube placement today in IR. Afebrile.  Risks and benefits of thoracentesis were discussed with the patient including, but not limited to bleeding, infection, pneumothorax, and that fact that all the fluid may not be removed during today's procedure. All of the patient's questions were answered, patient is agreeable to proceed. Consent signed and in [place].  Risks and benefits discussed with the patient including bleeding, infection, damage to adjacent structures, and sepsis. All of the patient's questions were answered, patient is agreeable to proceed. Consent signed and in [place].   Thank you for this interesting consult.  I greatly enjoyed meeting Chaska Hagger and look forward to participating in their care.  A copy of this report was sent to the requesting provider on this date.  Electronically Signed: Elwin Mocha, PA-C 05/30/2020, 1:16 PM   I spent a total of 20 Minutes in face to face in clinical consultation, greater than 50% of which was counseling/coordinating care for right pleural effusion/thoracentesis versus right chest tube placement.

## 2020-05-30 NOTE — Progress Notes (Signed)
CCM & myself assessed pt's WOB & need for Bipap. Pt is currently stable on [place]. No need for Bipap at this time per CCM.

## 2020-05-31 ENCOUNTER — Inpatient Hospital Stay (HOSPITAL_COMMUNITY): Payer: Medicaid Other

## 2020-05-31 DIAGNOSIS — N179 Acute kidney failure, unspecified: Secondary | ICD-10-CM

## 2020-05-31 DIAGNOSIS — Z9689 Presence of other specified functional implants: Secondary | ICD-10-CM

## 2020-05-31 DIAGNOSIS — J90 Pleural effusion, not elsewhere classified: Secondary | ICD-10-CM

## 2020-05-31 DIAGNOSIS — J984 Other disorders of lung: Secondary | ICD-10-CM

## 2020-05-31 DIAGNOSIS — R0902 Hypoxemia: Secondary | ICD-10-CM

## 2020-05-31 DIAGNOSIS — Z9889 Other specified postprocedural states: Secondary | ICD-10-CM

## 2020-05-31 LAB — COMPREHENSIVE METABOLIC PANEL
ALT: 26 U/L (ref 0–44)
AST: 25 U/L (ref 15–41)
Albumin: 1.4 g/dL — ABNORMAL LOW (ref 3.5–5.0)
Alkaline Phosphatase: 54 U/L (ref 38–126)
Anion gap: 8 (ref 5–15)
BUN: 37 mg/dL — ABNORMAL HIGH (ref 6–20)
CO2: 28 mmol/L (ref 22–32)
Calcium: 7.4 mg/dL — ABNORMAL LOW (ref 8.9–10.3)
Chloride: 99 mmol/L (ref 98–111)
Creatinine, Ser: 1.46 mg/dL — ABNORMAL HIGH (ref 0.61–1.24)
GFR, Estimated: 58 mL/min — ABNORMAL LOW (ref >60)
Glucose, Bld: 132 mg/dL — ABNORMAL HIGH (ref 70–99)
Potassium: 3.7 mmol/L (ref 3.5–5.1)
Sodium: 135 mmol/L (ref 135–145)
Total Bilirubin: 0.7 mg/dL (ref 0.3–1.2)
Total Protein: 4.4 g/dL — ABNORMAL LOW (ref 6.5–8.1)

## 2020-05-31 LAB — CBC WITH DIFFERENTIAL/PLATELET
Abs Immature Granulocytes: 4.21 10*3/uL — ABNORMAL HIGH (ref 0.00–0.07)
Basophils Absolute: 0.1 10*3/uL (ref 0.0–0.1)
Basophils Relative: 0 %
Eosinophils Absolute: 0 10*3/uL (ref 0.0–0.5)
Eosinophils Relative: 0 %
HCT: 36 % — ABNORMAL LOW (ref 39.0–52.0)
Hemoglobin: 12.6 g/dL — ABNORMAL LOW (ref 13.0–17.0)
Immature Granulocytes: 9 %
Lymphocytes Relative: 2 %
Lymphs Abs: 0.9 10*3/uL (ref 0.7–4.0)
MCH: 33 pg (ref 26.0–34.0)
MCHC: 35 g/dL (ref 30.0–36.0)
MCV: 94.2 fL (ref 80.0–100.0)
Monocytes Absolute: 2.4 10*3/uL — ABNORMAL HIGH (ref 0.1–1.0)
Monocytes Relative: 5 %
Neutro Abs: 37.6 10*3/uL — ABNORMAL HIGH (ref 1.7–7.7)
Neutrophils Relative %: 84 %
Platelets: 334 10*3/uL (ref 150–400)
RBC: 3.82 MIL/uL — ABNORMAL LOW (ref 4.22–5.81)
RDW: 13.7 % (ref 11.5–15.5)
WBC: 45.1 10*3/uL — ABNORMAL HIGH (ref 4.0–10.5)
nRBC: 0 % (ref 0.0–0.2)

## 2020-05-31 LAB — ALBUMIN, PLEURAL OR PERITONEAL FLUID: Albumin, Fluid: <1 g/dL

## 2020-05-31 LAB — BODY FLUID CELL COUNT WITH DIFFERENTIAL
Eos, Fluid: 0 %
Lymphs, Fluid: 9 %
Monocyte-Macrophage-Serous Fluid: 5 % — ABNORMAL LOW (ref 50–90)
Neutrophil Count, Fluid: 86 % — ABNORMAL HIGH (ref 0–25)
Total Nucleated Cell Count, Fluid: 6637 cu mm — ABNORMAL HIGH (ref 0–1000)

## 2020-05-31 LAB — LACTATE DEHYDROGENASE, PLEURAL OR PERITONEAL FLUID: LD, Fluid: 1278 U/L — ABNORMAL HIGH (ref 3–23)

## 2020-05-31 LAB — GRAM STAIN

## 2020-05-31 LAB — GLUCOSE, PLEURAL OR PERITONEAL FLUID: Glucose, Fluid: 101 mg/dL

## 2020-05-31 LAB — PROCALCITONIN: Procalcitonin: 7.37 ng/mL

## 2020-05-31 LAB — PROTEIN, PLEURAL OR PERITONEAL FLUID: Total protein, fluid: <3 g/dL

## 2020-05-31 LAB — D-DIMER, QUANTITATIVE: D-Dimer, Quant: 3.46 ug/mL-FEU — ABNORMAL HIGH (ref 0.00–0.50)

## 2020-05-31 NOTE — Progress Notes (Signed)
PROGRESS NOTE  Edward Mueller WUJ:[id number] DOB: 09/05/1970 DOA: 05/24/2020 PCP: Pcp, No  HPI/Recap of past 62 hours: 50 year old gentleman with no reported medical problems who donates plasma 2 times a week, donated plasma on 05/22/20 and after about 24 hours he started not feeling well.  Reported symptoms included generalized malaise, nausea, vomiting, shortness of breath, pleuritic chest pain, cough with blood-tinged sputum.  Came to the emergency room on 05/24/20, was febrile, tachypneic with lactic acidosis.  Found to have multilobular pneumonia on chest x-ray, left upper extremity cellulitis and left acute superficial vein thrombosis involving the left cephalic vein on 10/27/6211.   Was started on IV antibiotics and IV fluids and admitted to ICU for closer monitoring then later transferred to progressive care unit.  Work-up revealed MSSA bacteremia for which infectious disease was consulted.  Infective endocarditis was ruled out by TEE on 05/27/2020.  Due to worsening clinical picture PCCM was consulted.  Sputum culture from 05/29/2020 growing abundant gram-negative rods, abundant gram-positive cocci with few yeast, culture was reincubated for better growth.  He has been on cefazolin for MSSA bacteremia as recommended by infectious disease.  Repeated CT chest without contrast done on 05/30/2020 showed interval worsening of the aeration of the lungs.  IR was consulted for thoracentesis and chest tube placement due to loculated pleural effusion.    Post right thoracentesis by IR on 05/30/2020 with approximately 300 cc translucent, serosanguineous fluid removed and right chest tube placement.  Post left thoracentesis by IR on 05/31/2020 with 550 cc dark red fluid removed.  Chest tube is managed by PCCM.  05/31/20: Patient was seen at his bedside.  His mother was present.  States he feels a little better today.  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 No evidence of valvular vegetation or infective endocarditis on TEE. Management per infectious disease. He is currently on IV cefazolin. Repeated blood culture from 05/25/2020, negative final. Sputum culture obtained on 05/29/2020 revealed few consistent with normal respiratory flora, no Pseudomonas species isolated.  Continue to follow until finalized. Blood culture repeated on 05/31/2020 peripherally, follow.  Acute hypoxic respiratory failure secondary to multifocal pneumonia, bilateral pleural effusions, and extensive bilateral septic pulmonary emboli D-dimer on presentation greater than 4 On IV antibiotics as stated above. Post left and right thoracentesis and right chest tube placement by IR. Continue to maintain O2 saturation greater than 90%   Bilateral pleural effusions post right thoracentesis and right thoracostomy tube placement by IR on 05/30/2020. Approximately 300 cc translucent, serosanguineous fluid removed. Management per IR and PCCM. Post left thoracentesis on 05/31/20 by IR. Continue to monitor chest tube output. Repeat chest x-ray in the morning  Resolved post repletion.  Hypocalcemia Calcium corrected for albumin 9.5.  Resolved Non anion gap metabolic acidosis  AKI on CKD 3B Presented with creatinine of 2.7 Creatinine 1.46 from 1.41 from 1.51 Lower urine output recorded, unclear if accurate. Continue to monitor urine output Repeat renal panel in the morning.  Resolved mild hypovolemic hyponatremia Encourage increase in oral intake Serum sodium 134>> 136. Repeat BMP in the morning  Resolved post repletion: Hypokalemia Serum potassium 3.4>> 3.5. Repleted orally Serum magnesium 2.7 Repeat BMP  in the morning  Refractory hypophosphatemia Encourage oral intake  Serum phosphorus 2.4>> 2.5>2.4. Repleted.  Repeat phosphorus level in the morning  Resolved left upper extremity cellulitis Initially presented with left upper extremity cellulitis which has been treated and has now resolved  Left upper extremity acute superficial vein thrombosis Continue to monitor  THC/tobacco use UDS positive for THC on 05/24/2020 Denies use of cocaine or intravenous drug abuse Cessation counseling when hemodynamically stable.      Code Status: Full code.  Family Communication: Updated his mother in person on 05/31/2020.  Disposition Plan: Likely will discharge to home once oxygen requirement is improved and infectious disease/PCCM sign off.   Consultants:  Infectious disease.  PCCM  Procedures:  2D echo on 05/25/2020  TEE on 05/27/2020.  Antimicrobials:  Cefazolin for 05/25/20>>  Merrem/Zyvox 05/30/20, Dced 05/30/20 as recommended by ID.  DVT prophylaxis: Subcu heparin 3 times daily.  Status is: Inpatient    Dispo: The patient is from: Home.              Anticipated d/c is to: Home.              Patient currently not stable for discharge due to acute hypoxic respiratory failure, management of MSSA bacteremia, worsening multifocal pneumonia, bilateral pleural effusion, acute hypoxic respiratory failure.   Difficult to place patient: Not applicable.        Objective: Vitals:   05/31/20 0521 05/31/20 1128 05/31/20 1138 05/31/20 1200  BP: 126/81 (!) 143/89 129/82 134/80  Pulse: 98   (!) 109  Resp: (!) 25   (!) 42  Temp: 100.2 F (37.9 C)     TempSrc: Oral   Oral  SpO2: 92%   94%  Weight:      Height:        Intake/Output Summary (Last 24 hours) at 05/31/2020 1240 Last data filed at 05/31/2020 7654 Gross per 24 hour  Intake 682.95 ml  Output 2725 ml  Net -2042.05 ml   Filed Weights   05/24/20 1410 05/29/20 0500  Weight: 68 kg 68.9 kg    Exam:  . General: 50  y.o. year-old male weak appearing in no acute distress.  He is alert and oriented x3.   . Cardiovascular: Tachycardic with no rubs or gallops.   Marland Kitchen Respiratory: Diffuse rales bilaterally with no wheezing noted.  Poor inspiratory effort.   . Abdomen: Soft nontender normal bowel sounds present. . Musculoskeletal: No lower extremity edema bilaterally.   . Skin: No ulcerative lesions noted. Marland Kitchen Psychiatry: Mood is appropriate for condition and setting.  Data Reviewed: CBC: Recent Labs  Lab 05/24/20 1417 05/24/20 1512 05/27/20 0244 05/28/20 0255 05/29/20 0308 05/30/20 0209 05/31/20 0124  WBC 6.4   < > 10.9* 16.4* 25.0* 34.8* 45.1*  NEUTROABS 5.8  --   --  12.9*  --   --  37.6*  HGB 16.8   < > 12.7* 12.5* 11.9* 12.5* 12.6*  HCT 48.0   < > 36.1* 35.8* 33.8* 36.5* 36.0*  MCV 95.2   < > 92.3 92.5 92.6 94.6 94.2  PLT 207   < > 160 PLATELET CLUMPS NOTED ON SMEAR, UNABLE TO ESTIMATE 140* 194 334   < > = values in this interval not displayed.   Basic Metabolic Panel: Recent Labs  Lab 05/26/20 0304 05/27/20 0244 05/28/20 0255 05/29/20 0308 05/30/20 0209 05/31/20 0124  NA 133* 133* 135 134* 136 135  K 3.7 3.5 3.7 3.4* 3.5 3.7  CL 100 101 102 101 102 99  CO2 23 25 21* 26 28 28  GLUCOSE 107* 116* 111* 122* 111* 132*  BUN 42* 46* 48* 44* 38* 37*  CREATININE 2.53* 1.90* 1.50* 1.51* 1.41* 1.46*  CALCIUM 7.5* 7.6* 7.4* 7.4* 7.4* 7.4*  MG 2.1 2.8* 3.0* 2.8* 2.7*  --   PHOS 3.5 2.4* 2.5 2.5 2.4*  --    GFR: Estimated Creatinine Clearance: 59 mL/min (A) (by C-G formula based on SCr of 1.46 mg/dL (H)). Liver Function Tests: Recent Labs  Lab 05/24/20 1417 05/31/20 0124  AST 35 25  ALT 24 26  ALKPHOS 56 54  BILITOT 1.2 0.7  PROT 6.0* 4.4*  ALBUMIN 2.6* 1.4*   No results for input(s): LIPASE, AMYLASE in the last 168 hours. No results for input(s): AMMONIA in the last 168 hours. Coagulation Profile: Recent Labs  Lab 05/24/20 1417  INR 1.1   Cardiac Enzymes: No results for  input(s): CKTOTAL, CKMB, CKMBINDEX, TROPONINI in the last 168 hours. BNP (last 3 results) No results for input(s): PROBNP in the last 8760 hours. HbA1C: No results for input(s): HGBA1C in the last 72 hours. CBG: No results for input(s): GLUCAP in the last 168 hours. Lipid Profile: No results for input(s): CHOL, HDL, LDLCALC, TRIG, CHOLHDL, LDLDIRECT in the last 72 hours. Thyroid Function Tests: No results for input(s): TSH, T4TOTAL, FREET4, T3FREE, THYROIDAB in the last 72 hours. Anemia Panel: No results for input(s): VITAMINB12, FOLATE, FERRITIN, TIBC, IRON, RETICCTPCT in the last 72 hours. Urine analysis:    Component Value Date/Time   COLORURINE YELLOW 05/24/2020 0502   APPEARANCEUR HAZY (A) 05/24/2020 0502   LABSPEC 1.016 05/24/2020 0502   PHURINE 5.0 05/24/2020 0502   GLUCOSEU 50 (A) 05/24/2020 0502   HGBUR MODERATE (A) 05/24/2020 0502   BILIRUBINUR NEGATIVE 05/24/2020 0502   KETONESUR NEGATIVE 05/24/2020 0502   PROTEINUR 100 (A) 05/24/2020 0502   NITRITE NEGATIVE 05/24/2020 0502   LEUKOCYTESUR NEGATIVE 05/24/2020 0502   Sepsis Labs: (procalcitonin:4,lacticidven:4)  ) Recent Results (from the past 240 hour(s))  Blood Culture (routine x 2)     Status: Abnormal   Collection Time: 05/24/20  3:08 PM   Specimen: Left Antecubital; Blood  Result Value Ref Range Status   Specimen Description LEFT ANTECUBITAL  Final   Special Requests   Final    BOTTLES DRAWN AEROBIC AND ANAEROBIC Blood Culture results may not be optimal due to an inadequate volume of blood received in culture bottles   Culture  Setup Time   Final    GRAM POSITIVE COCCI IN CLUSTERS IN BOTH AEROBIC AND ANAEROBIC BOTTLES CRITICAL RESULT CALLED TO, READ BACK BY AND VERIFIED WITH: PHARMD A WOLF [id number] AT 754 AM BY CM Performed at [facility], [address], [place], [place] [address]    Culture STAPHYLOCOCCUS AUREUS (A)  Final   Report Status 05/27/2020 FINAL  Final   Organism ID, Bacteria  STAPHYLOCOCCUS AUREUS  Final      Susceptibility   Staphylococcus aureus - MIC*    CIPROFLOXACIN <=0.5 SENSITIVE Sensitive     ERYTHROMYCIN >=8 RESISTANT Resistant     GENTAMICIN <=0.5 SENSITIVE Sensitive     OXACILLIN 0.5 SENSITIVE Sensitive     TETRACYCLINE <=1 SENSITIVE Sensitive     VANCOMYCIN <=0.5 SENSITIVE Sensitive     TRIMETH/SULFA <=10 SENSITIVE Sensitive     CLINDAMYCIN <=0.25 SENSITIVE Sensitive     RIFAMPIN <=0.5 SENSITIVE Sensitive     Inducible Clindamycin NEGATIVE Sensitive     * STAPHYLOCOCCUS AUREUS  Blood Culture ID Panel (Reflexed)     Status:  Abnormal   Collection Time: 05/24/20  3:08 PM  Result Value Ref Range Status   Enterococcus faecalis NOT DETECTED NOT DETECTED Final   Enterococcus Faecium NOT DETECTED NOT DETECTED Final   Listeria monocytogenes NOT DETECTED NOT DETECTED Final   Staphylococcus species DETECTED (A) NOT DETECTED Final    Comment: CRITICAL RESULT CALLED TO, READ BACK BY AND VERIFIED WITH: PHARMD A WOLF [id number] AT 800 BY CM    Staphylococcus aureus (BCID) DETECTED (A) NOT DETECTED Final    Comment: CRITICAL RESULT CALLED TO, READ BACK BY AND VERIFIED WITH: PHARMD A WOLF [id number] AT 800 BY CM    Staphylococcus epidermidis NOT DETECTED NOT DETECTED Final   Staphylococcus lugdunensis NOT DETECTED NOT DETECTED Final   Streptococcus species NOT DETECTED NOT DETECTED Final   Streptococcus agalactiae NOT DETECTED NOT DETECTED Final   Streptococcus pneumoniae NOT DETECTED NOT DETECTED Final   Streptococcus pyogenes NOT DETECTED NOT DETECTED Final   A.calcoaceticus-baumannii NOT DETECTED NOT DETECTED Final   Bacteroides fragilis NOT DETECTED NOT DETECTED Final   Enterobacterales NOT DETECTED NOT DETECTED Final   Enterobacter cloacae complex NOT DETECTED NOT DETECTED Final   Escherichia coli NOT DETECTED NOT DETECTED Final   Klebsiella aerogenes NOT DETECTED NOT DETECTED Final   Klebsiella oxytoca NOT DETECTED NOT DETECTED Final   Klebsiella  pneumoniae NOT DETECTED NOT DETECTED Final   Proteus species NOT DETECTED NOT DETECTED Final   Salmonella species NOT DETECTED NOT DETECTED Final   Serratia marcescens NOT DETECTED NOT DETECTED Final   Haemophilus influenzae NOT DETECTED NOT DETECTED Final   Neisseria meningitidis NOT DETECTED NOT DETECTED Final   Pseudomonas aeruginosa NOT DETECTED NOT DETECTED Final   Stenotrophomonas maltophilia NOT DETECTED NOT DETECTED Final   Candida albicans NOT DETECTED NOT DETECTED Final   Candida auris NOT DETECTED NOT DETECTED Final   Candida glabrata NOT DETECTED NOT DETECTED Final   Candida krusei NOT DETECTED NOT DETECTED Final   Candida parapsilosis NOT DETECTED NOT DETECTED Final   Candida tropicalis NOT DETECTED NOT DETECTED Final   Cryptococcus neoformans/gattii NOT DETECTED NOT DETECTED Final   Meth resistant mecA/C and MREJ NOT DETECTED NOT DETECTED Final    Comment: Performed at [facility], [address], [place], [place] [address]  Resp Panel by RT-PCR (Flu A&B, Covid) Nasopharyngeal Swab     Status: None   Collection Time: 05/24/20  3:14 PM   Specimen: Nasopharyngeal Swab; Nasopharyngeal(NP) swabs in vial transport medium  Result Value Ref Range Status   SARS Coronavirus 2 by RT PCR NEGATIVE NEGATIVE Final    Comment: (NOTE) SARS-CoV-2 target nucleic acids are NOT DETECTED.  The SARS-CoV-2 RNA is generally detectable in upper respiratory specimens during the acute phase of infection. The lowest concentration of SARS-CoV-2 viral copies this assay can detect is 138 copies/mL. A negative result does not preclude SARS-Cov-2 infection and should not be used as the sole basis for treatment or other patient management decisions. A negative result may occur with  improper specimen collection/handling, submission of specimen other than nasopharyngeal swab, presence of viral mutation(s) within the areas targeted by this assay, and inadequate number of viral copies(<138  copies/mL). A negative result must be combined with clinical observations, patient history, and epidemiological information. The expected result is Negative.  Fact Sheet for Patients:  BloggerCourse.com  Fact Sheet for Healthcare Providers:  SeriousBroker.it  This test is no t yet approved or cleared by the [place] FDA and  has been authorized for detection and/or  diagnosis of SARS-CoV-2 by FDA under an Emergency Use Authorization (EUA). This EUA will remain  in effect (meaning this test can be used) for the duration of the COVID-19 declaration under Section 564(b)(1) of the Act, 21 U.S.C.section 360bbb-3(b)(1), unless the authorization is terminated  or revoked sooner.       Influenza A by PCR NEGATIVE NEGATIVE Final   Influenza B by PCR NEGATIVE NEGATIVE Final    Comment: (NOTE) The Xpert Xpress SARS-CoV-2/FLU/RSV plus assay is intended as an aid in the diagnosis of influenza from Nasopharyngeal swab specimens and should not be used as a sole basis for treatment. Nasal washings and aspirates are unacceptable for Xpert Xpress SARS-CoV-2/FLU/RSV testing.  Fact Sheet for Patients: BloggerCourse.com  Fact Sheet for Healthcare Providers: SeriousBroker.it  This test is not yet approved or cleared by the [place] FDA and has been authorized for detection and/or diagnosis of SARS-CoV-2 by FDA under an Emergency Use Authorization (EUA). This EUA will remain in effect (meaning this test can be used) for the duration of the COVID-19 declaration under Section 564(b)(1) of the Act, 21 U.S.C. section 360bbb-3(b)(1), unless the authorization is terminated or revoked.  Performed at [facility], [address], [place], [place] [address]   Blood Culture (routine x 2)     Status: Abnormal   Collection Time: 05/24/20  9:45 PM   Specimen: BLOOD LEFT HAND  Result Value Ref  Range Status   Specimen Description BLOOD LEFT HAND  Final   Special Requests   Final    BOTTLES DRAWN AEROBIC AND ANAEROBIC Blood Culture results may not be optimal due to an inadequate volume of blood received in culture bottles   Culture  Setup Time   Final    GRAM POSITIVE COCCI IN CLUSTERS IN BOTH AEROBIC AND ANAEROBIC BOTTLES CRITICAL VALUE NOTED.  VALUE IS CONSISTENT WITH PREVIOUSLY REPORTED AND CALLED VALUE.    Culture (A)  Final    STAPHYLOCOCCUS AUREUS SUSCEPTIBILITIES PERFORMED ON PREVIOUS CULTURE WITHIN THE LAST 5 DAYS. Performed at [facility], [address], [place], [place] [address]    Report Status 05/27/2020 FINAL  Final  MRSA PCR Screening     Status: None   Collection Time: 05/24/20 11:46 PM   Specimen: Nasopharyngeal  Result Value Ref Range Status   MRSA by PCR NEGATIVE NEGATIVE Final    Comment:        The GeneXpert MRSA Assay (FDA approved for NASAL specimens only), is one component of a comprehensive MRSA colonization surveillance program. It is not intended to diagnose MRSA infection nor to guide or monitor treatment for MRSA infections. Performed at [facility], [address] [place], [place] [address]   Urine culture     Status: None   Collection Time: 05/24/20 11:59 PM   Specimen: In/Out Cath Urine  Result Value Ref Range Status   Specimen Description IN/OUT CATH URINE  Final   Special Requests NONE  Final   Culture   Final    NO GROWTH Performed at [facility], [address] [place], [place] [address]    Report Status 05/26/2020 FINAL  Final  Culture, blood (routine x 2)     Status: None   Collection Time: 05/25/20 10:19 PM   Specimen: BLOOD LEFT HAND  Result Value Ref Range Status   Specimen Description BLOOD LEFT HAND  Final   Special Requests   Final    BOTTLES DRAWN AEROBIC AND ANAEROBIC Blood Culture  adequate volume   Culture   Final    NO GROWTH 5 DAYS Performed at [facility], [address], [place], [place] [address]    Report Status 05/30/2020 FINAL  Final  Culture, blood (routine x 2)     Status: None   Collection Time: 05/25/20 10:32 PM   Specimen: BLOOD RIGHT HAND  Result Value Ref Range Status   Specimen Description BLOOD RIGHT HAND  Final   Special Requests AEROBIC BOTTLE ONLY Blood Culture adequate volume  Final   Culture   Final    NO GROWTH 5 DAYS Performed at [facility], [address] [place], [place] [address]    Report Status 05/30/2020 FINAL  Final  Expectorated Sputum Assessment w Gram Stain, Rflx to Resp Cult     Status: None   Collection Time: 05/29/20  7:11 AM   Specimen: Expectorated Sputum  Result Value Ref Range Status   Specimen Description EXPECTORATED SPUTUM  Final   Special Requests Normal  Final   Sputum evaluation   Final    THIS SPECIMEN IS ACCEPTABLE FOR SPUTUM CULTURE Performed at [facility], [address], [place], [place] [address]    Report Status 05/29/2020 FINAL  Final  Culture, Respiratory w Gram Stain     Status: None (Preliminary result)   Collection Time: 05/29/20  7:11 AM  Result Value Ref Range Status   Specimen Description EXPECTORATED SPUTUM  Final   Special Requests Normal Reflexed from [id number]  Final   Gram Stain   Final    ABUNDANT WBC PRESENT,BOTH PMN AND MONONUCLEAR FEW SQUAMOUS EPITHELIAL CELLS PRESENT ABUNDANT GRAM NEGATIVE RODS ABUNDANT GRAM POSITIVE COCCI FEW YEAST    Culture   Final    FEW Consistent with normal respiratory flora. No Pseudomonas species isolated Performed at [facility], [address] [place], [place] [address]    Report Status PENDING  Incomplete  Culture, blood (routine x 2)     Status: None (Preliminary result)   Collection Time: 05/31/20  8:07 AM   Specimen: BLOOD LEFT HAND  Result Value Ref Range Status   Specimen Description BLOOD LEFT HAND  Final   Special Requests   Final    BOTTLES DRAWN AEROBIC AND ANAEROBIC Blood Culture adequate volume   Culture    Final    NO GROWTH < 12 HOURS Performed at [facility], [address] [place], [place] [address]    Report Status PENDING  Incomplete  Culture, blood (routine x 2)     Status: None (Preliminary result)   Collection Time: 05/31/20  8:12 AM   Specimen: BLOOD RIGHT HAND  Result Value Ref Range Status   Specimen Description BLOOD RIGHT HAND  Final   Special Requests   Final    BOTTLES DRAWN AEROBIC ONLY Blood Culture results may not be optimal due to an inadequate volume of blood received in culture bottles   Culture   Final    NO GROWTH < 12 HOURS Performed at [facility], [address] [place], [place] [address]    Report Status PENDING  Incomplete      Studies: DG Chest 1 View  Result Date: 05/31/2020 CLINICAL DATA:  50 year old male status post left thoracentesis. Follow-up study. EXAM: CHEST  1 VIEW COMPARISON:  Chest x-ray 05/31/2020. FINDINGS: Previously noted left pleural effusion has decreased and is now very small. No appreciable left-sided pneumothorax. Trace right pleural effusion. Small bore  pigtail drainage catheter in the inferior aspect of the right hemithorax. Several areas of airspace consolidation and cavitation are again noted in the lungs bilaterally, similar to the prior study. No pneumothorax. No evidence of pulmonary edema. Heart size is normal. Upper mediastinal contours are within normal limits. IMPRESSION: 1. Decreased now small left pleural effusion following left-sided thoracentesis. No pneumothorax. Multilobar bilateral cavitary pneumonia, similar to the prior examination. Electronically Signed   By: Trudie Reed M.D.   On: 05/31/2020 12:24   DG CHEST PORT 1 VIEW  Result Date: 05/31/2020 CLINICAL DATA:  Chest tube in place EXAM: PORTABLE CHEST 1 VIEW COMPARISON:  May 30, 2020 chest radiograph and chest CT FINDINGS: Pigtail catheter noted at right base, unchanged. No pneumothorax. Multiple opacities throughout the lungs, some of which are  cavitated, persist without change. There is consolidation in the left base with small left pleural effusion. Partial clearing of opacity from right base compared to 1 day prior. Heart is mildly enlarged with pulmonary vascularity within normal limits. No adenopathy. No bone lesions. IMPRESSION: Pigtail catheter at right base. No pneumothorax. Multiple cavitary lesions, likely septic emboli, persist. Consolidation left lower lung region with small left pleural effusion. There has been partial clearing of opacity from the right base compared to 1 day prior. Stable cardiac silhouette. Electronically Signed   By: Bretta Bang III M.D.   On: 05/31/2020 08:07   DG CHEST PORT 1 VIEW  Result Date: 05/30/2020 CLINICAL DATA:  Shortness of breath. EXAM: PORTABLE CHEST 1 VIEW COMPARISON:  05/28/2020 FINDINGS: Diffuse patchy and nodular bilateral airspace disease is stable. Some of the nodules are cavitated. The cardiopericardial silhouette is within normal limits for size. Pleural drain noted over the right lower hemithorax. Telemetry leads overlie the chest. IMPRESSION: No substantial interval change diffuse bilateral patchy and nodular airspace disease bilaterally. Electronically Signed   By: Kennith Center M.D.   On: 05/30/2020 15:29   US THORACENTESIS ASP PLEURAL SPACE W/IMG GUIDE  Result Date: 05/30/2020 INDICATION: 50 year old male with bacteremia, pulmonary septic emboli, and bilateral pleural effusions, right greater than left. EXAM: 1. Ultrasound-guided puncture of right pleural space. 2. Placement of right thoracostomy tube. MEDICATIONS: The patient is currently admitted to the hospital and receiving intravenous antibiotics. The antibiotics were administered within an appropriate time frame prior to the initiation of the procedure. ANESTHESIA/SEDATION: Local anesthesia only. COMPLICATIONS: None immediate. PROCEDURE: Informed written consent was obtained from the patient after a thorough discussion of the  procedural risks, benefits and alternatives. All questions were addressed. Maximal Sterile Barrier Technique was utilized including caps, mask, sterile gowns, sterile gloves, sterile drape, hand hygiene and skin antiseptic. A timeout was performed prior to the initiation of the procedure. Preprocedure ultrasound evaluation demonstrated mildly complex fluid collection within the right pleural space of small to moderate volume. Given complexity of fluid, a pigtail thoracostomy tube was requested by the ordering provider. The procedure was planned. The right lower back was prepped and draped in standard fashion. Subdermal Local anesthesia was provided at the planned needle entry site with 1% lidocaine. Deeper local anesthetic was administered under ultrasound guidance along the pleura at the planned needle entry site. A small skin nick was made. Under direct ultrasound visualization, a 7 cm, 5 [place] Yueh needle was directed into the right pleural space. A stiff Amplatz wire was inserted through the Yueh catheter and serial dilation was performed prior to placement of 10.2 French pigtail thoracostomy tube. The pigtail portion was formed. Ultrasound evaluation demonstrated position of  the catheter within the pleural space. There was immediate E flux of translucent, serosanguineous fluid. The catheter was connected to a pleura vac and set to wall suction. There was immediate evacuation of approximately 300 mL of fluid. The drainage catheter was sutured at the skin entry site and a sterile bandage was applied with nonocclusive gauze around the skin entry site. The patient tolerated the procedure well was transferred back to the floor in stable condition. IMPRESSION: Technically successful right basilar pigtail thoracostomy tube (10.2 Fr) placement. Marliss Cootsylan Suttle, MD [facility] Electronically Signed   By: Marliss Cootsylan  Suttle MD   On: 05/30/2020 16:06    Scheduled  Meds: . alteplase (TPA) for intrapleural administration  10 mg Intrapleural Daily   And  . pulmozyme (DORNASE) for intrapleural administration  5 mg Intrapleural Daily  . heparin  5,000 Units Subcutaneous Q8H  . potassium & sodium phosphates  1 packet Oral TID AC & HS  . sodium chloride flush  10 mL Intracatheter Q8H    Continuous Infusions: .  ceFAZolin (ANCEF) IV 10 mL/hr at 05/31/20 0700     LOS: 7 days     Darlin Droparole N Jonquil Stubbe, MD [facility] Pager [phone]  If 7PM-7AM, please contact night-coverage [email or web address] Password [email or web address] 05/31/2020, 12:40 PM

## 2020-05-31 NOTE — Progress Notes (Signed)
pleural vac changed from being full. Pt tolerated well.

## 2020-05-31 NOTE — Progress Notes (Signed)
This is a progress note  NAME:  Edward Mueller, MRN:  [id number], DOB:  January 11, 1971, LOS: 7 ADMISSION DATE:  05/24/2020, CONSULTATION DATE:  05/24/2020 REFERRING MD: Ronnald Nian , CHIEF COMPLAINT: Dyspnea  History of Present Illness:  50 year old man who presented to [facility] on 4/3 with c/o dyspnea.    Reportedly was in his usual state of health until 48 hours prior to admit. 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  On 4/1 he donated plasma.  After that he woke with a "knot on his arm" and felt poorly.  He had malaise, N/V and SOB with associated central chest pain.  He began coughing up bloody sputum.  He was found to have sepsis in the setting of left arm cellulitis, extensive PNA, MSSA bacteremia & AKI.  TEE negative for endocarditis.   Pertinent  Medical History  Tobacco / THC Use   Significant Hospital Events: Including procedures, antibiotic start and stop dates in addition to other pertinent events   . 4/3 Admit with dyspnea, febrile with elevated lactate, imaging consistent with PNA, L arm cellulitis. Negative HIV, COVID, Flu. Cultures positive for MSSA.  Marland Kitchen 4/4 To [facility]  . 4/6 TEE negative for vegetation  . 4/7 PCCM called back for increased O2 needs. O2 weaned to 3L on exam, sats 97%. VBG 7.4 / 42s . 4/9 chest CT revealed bilateral pleural effusions with appearance of loculation, IR consulted and placed right pigtail chest tube patient received first dose of TPA/dornase . 4/10  2L removed via chest tube since insertion.  Second dose TPA dornase administered  Interim History / Subjective:  No acute issues overnight Patient states dyspnea and pleuritic chest pain improved post chest tube placement T-max 102.5 overnight  Objective   Blood pressure 126/81, pulse 98, temperature 100.2 F (37.9 C), temperature source Oral, resp. rate (!) 25, height 6' 3"  (1.905 m), weight 68.9 kg, SpO2 92 %.        Intake/Output Summary (Last 24  hours) at 05/31/2020 0850 Last data filed at 05/31/2020 0700 Gross per 24 hour  Intake 682.95 ml  Output 2600 ml  Net -1917.05 ml   Filed Weights   05/24/20 1410 05/29/20 0500  Weight: 68 kg 68.9 kg    Examination: General: Acute on chronic ill-appearing middle-aged male lying in bed in no acute distress HEENT: [name]/AT, MM pink/moist, PERRL,  Neuro: Alert and oriented x3, nonfocal CV: s1s2 regular rate and rhythm, no murmur, rubs, or gallops,  PULM: Slight tachypnea but improved prior shallow respirations, oxygen saturations appropriate on 4 L nasal cannula GI: soft, bowel sounds active in all 4 quadrants, non-tender, non-distended Extremities: warm/dry, no edema  Skin: no rashes or lesions  Labs/imaging that I Have  . 2CXR 4/7>> Persistent diffuse bilateral nodular and geographic areas of airspace infiltrate. Imaging findings most suggestive of septic emboli Small bilateral pleural effusions.\ . 4/8 WBC up to 25 K, febrile.Pro calcitonin has dropped from 31 to 20.43 over night Net + 5 L . 4/9 chest CT > worsening of aeration of lungs with numerous prominently peripheral areas of nodularity and confluent airspace consolidation interval development of cavitations within multiple areas of airspace consolidation, moderate-sized bilateral pleural effusions  Resolved Hospital Problem list   Septic Shock   Assessment & Plan:   Sepsis secondary to L Arm Cellulitis, MSSA Bacteremia with associated PNA -TEE negative for vegetation  P: Patient continues to remain febrile with  T-max 102.5 overnight ID consulted, appreciate assistance Continue IV cefazolin Supportive care  Acute Hypoxemic Respiratory Failure secondary to MSSA PNA  Tobacco / THC Use  -May have some degree of undiagnosed underlying obstructive disease given smoking history.   Pleural effusion -Seen on chest CT 4/9, s/p IR guided pigtail chest  P: Continue supplemental oxygen  SPO2 goal > 90 Continue aggressive  pulmonary hygiene Frequent I-S and flutter valve use Mobilize as able Routine chest tube care Monitor chest tube output Repeat TPA and dornase today  AKI in setting of Sepsis  -Resolved shock  P: Creatinine slowly improving Continue to follow renal function Monitor urine output Trend be met Avoid nephrotoxins Optimize electrolytes   LUE Cellulitis with Superficial DVT P: Supportive care  Best practice   Diet:  Oral Pain/Anxiety/Delirium protocol (if indicated): No oral pain meds only VAP protocol (if indicated): Not indicated DVT prophylaxis: LMWH GI prophylaxis: N/A Glucose control:  SSI No Central venous access:  N/A Arterial line:  N/A Foley:  N/A Mobility:  OOB  PT consulted: N/A Last date of multidisciplinary goals of care discussion [n/a] Code Status:  full code Disposition: PCU   Johnsie Cancel, NP-C [facility] Personal contact information can be found on Amion  05/31/2020, 8:50 AM

## 2020-05-31 NOTE — Progress Notes (Signed)
Subjective:  Patient complaining of pleuritic chest pain   Antibiotics:  Anti-infectives (From admission, onward)   Start     Dose/Rate Route Frequency Ordered Stop   05/30/20 0715  meropenem (MERREM) 1 g in sodium chloride 0.9 % 100 mL IVPB  Status:  Discontinued        1 g 200 mL/hr over 30 Minutes Intravenous Every 8 hours 05/30/20 0621 05/30/20 1356   05/30/20 0615  linezolid (ZYVOX) IVPB 600 mg  Status:  Discontinued        600 mg 300 mL/hr over 60 Minutes Intravenous Every 12 hours 05/30/20 0609 05/30/20 1357   05/25/20 2200  ceFAZolin (ANCEF) IVPB 2g/100 mL premix        2 g 200 mL/hr over 30 Minutes Intravenous Every 8 hours 05/25/20 0945     05/24/20 2200  cefTRIAXone (ROCEPHIN) 2 g in sodium chloride 0.9 % 100 mL IVPB  Status:  Discontinued        2 g 200 mL/hr over 30 Minutes Intravenous Every 12 hours 05/24/20 1934 05/25/20 0936   05/24/20 1715  ceFEPIme (MAXIPIME) 2 g in sodium chloride 0.9 % 100 mL IVPB  Status:  Discontinued        2 g 200 mL/hr over 30 Minutes Intravenous Every 24 hours 05/24/20 1713 05/24/20 1934   05/24/20 1715  vancomycin (VANCOREADY) IVPB 1250 mg/250 mL        1,250 mg 166.7 mL/hr over 90 Minutes Intravenous  Once 05/24/20 1713 05/24/20 2153   05/24/20 1713  vancomycin variable dose per unstable renal function (pharmacist dosing)  Status:  Discontinued         Does not apply See admin instructions 05/24/20 1713 05/25/20 0936   05/24/20 1645  metroNIDAZOLE (FLAGYL) IVPB 500 mg        500 mg 100 mL/hr over 60 Minutes Intravenous  Once 05/24/20 1644 05/24/20 1850      Medications: Scheduled Meds: . alteplase (TPA) for intrapleural administration  10 mg Intrapleural Daily   And  . pulmozyme (DORNASE) for intrapleural administration  5 mg Intrapleural Daily  . heparin  5,000 Units Subcutaneous Q8H  . sodium chloride flush  10 mL Intracatheter Q8H   Continuous Infusions: .  ceFAZolin (ANCEF) IV 2 g (05/31/20 1303)   PRN  Meds:.acetaminophen, albuterol, docusate sodium, oxyCODONE, polyethylene glycol    Objective: Weight change:   Intake/Output Summary (Last 24 hours) at 05/31/2020 1609 Last data filed at 05/31/2020 0923 Gross per 24 hour  Intake 682.95 ml  Output 1115 ml  Net -432.05 ml   Blood pressure 113/81, pulse 100, temperature (!) 100.6 F (38.1 C), temperature source Oral, resp. rate (!) 36, height 6\' 3"  (1.905 m), weight 68.9 kg, SpO2 93 %. Temp:  [99 F (37.2 C)-100.6 F (38.1 C)] 100.6 F (38.1 C) (04/10 1600) Pulse Rate:  [94-109] 100 (04/10 1600) Resp:  [20-44] 36 (04/10 1330) BP: (113-143)/(70-89) 113/81 (04/10 1600) SpO2:  [92 %-94 %] 93 % (04/10 1600)  Physical Exam: Physical Exam Constitutional:      Appearance: He is cachectic.  HENT:     Head: Normocephalic and atraumatic.  Eyes:     Conjunctiva/sclera: Conjunctivae normal.  Cardiovascular:     Rate and Rhythm: Normal rate and regular rhythm.  Pulmonary:     Effort: Tachypnea present. No respiratory distress.     Breath sounds: Examination of the right-middle field reveals decreased breath sounds. Examination of the right-lower field reveals decreased  breath sounds. Decreased breath sounds present. No wheezing.  Abdominal:     General: There is no distension.     Palpations: Abdomen is soft.  Musculoskeletal:        General: Normal range of motion.     Cervical back: Normal range of motion and neck supple.  Skin:    General: Skin is warm and dry.     Findings: No erythema or rash.  Neurological:     Mental Status: He is alert and oriented to person, place, and time.  Psychiatric:        Behavior: Behavior normal.        Thought Content: Thought content normal.        Judgment: Judgment normal.      CBC:    BMET Recent Labs    05/30/20 0209 05/31/20 0124  NA 136 135  K 3.5 3.7  CL 102 99  CO2 28 28  GLUCOSE 111* 132*  BUN 38* 37*  CREATININE 1.41* 1.46*  CALCIUM 7.4* 7.4*     Liver  Panel  Recent Labs    05/31/20 0124  PROT 4.4*  ALBUMIN 1.4*  AST 25  ALT 26  ALKPHOS 54  BILITOT 0.7       Sedimentation Rate No results for input(s): ESRSEDRATE in the last 72 hours. C-Reactive Protein No results for input(s): CRP in the last 72 hours.  Micro Results: Recent Results (from the past 720 hour(s))  Blood Culture (routine x 2)     Status: Abnormal   Collection Time: 05/24/20  3:08 PM   Specimen: Left Antecubital; Blood  Result Value Ref Range Status   Specimen Description LEFT ANTECUBITAL  Final   Special Requests   Final    BOTTLES DRAWN AEROBIC AND ANAEROBIC Blood Culture results may not be optimal due to an inadequate volume of blood received in culture bottles   Culture  Setup Time   Final    GRAM POSITIVE COCCI IN CLUSTERS IN BOTH AEROBIC AND ANAEROBIC BOTTLES CRITICAL RESULT CALLED TO, READ BACK BY AND VERIFIED WITH: PHARMD A WOLF [id number] AT 754 AM BY CM Performed at [facility], [address], [place], [place] [address]    Culture STAPHYLOCOCCUS AUREUS (A)  Final   Report Status 05/27/2020 FINAL  Final   Organism ID, Bacteria STAPHYLOCOCCUS AUREUS  Final      Susceptibility   Staphylococcus aureus - MIC*    CIPROFLOXACIN <=0.5 SENSITIVE Sensitive     ERYTHROMYCIN >=8 RESISTANT Resistant     GENTAMICIN <=0.5 SENSITIVE Sensitive     OXACILLIN 0.5 SENSITIVE Sensitive     TETRACYCLINE <=1 SENSITIVE Sensitive     VANCOMYCIN <=0.5 SENSITIVE Sensitive     TRIMETH/SULFA <=10 SENSITIVE Sensitive     CLINDAMYCIN <=0.25 SENSITIVE Sensitive     RIFAMPIN <=0.5 SENSITIVE Sensitive     Inducible Clindamycin NEGATIVE Sensitive     * STAPHYLOCOCCUS AUREUS  Blood Culture ID Panel (Reflexed)     Status: Abnormal   Collection Time: 05/24/20  3:08 PM  Result Value Ref Range Status   Enterococcus faecalis NOT DETECTED NOT DETECTED Final   Enterococcus Faecium NOT DETECTED NOT DETECTED Final   Listeria monocytogenes NOT DETECTED NOT DETECTED Final    Staphylococcus species DETECTED (A) NOT DETECTED Final    Comment: CRITICAL RESULT CALLED TO, READ BACK BY AND VERIFIED WITH: PHARMD A WOLF [id number] AT 800 BY CM    Staphylococcus aureus (BCID) DETECTED (A) NOT DETECTED Final  Comment: CRITICAL RESULT CALLED TO, READ BACK BY AND VERIFIED WITH: PHARMD A WOLF [id number] AT 800 BY CM    Staphylococcus epidermidis NOT DETECTED NOT DETECTED Final   Staphylococcus lugdunensis NOT DETECTED NOT DETECTED Final   Streptococcus species NOT DETECTED NOT DETECTED Final   Streptococcus agalactiae NOT DETECTED NOT DETECTED Final   Streptococcus pneumoniae NOT DETECTED NOT DETECTED Final   Streptococcus pyogenes NOT DETECTED NOT DETECTED Final   A.calcoaceticus-baumannii NOT DETECTED NOT DETECTED Final   Bacteroides fragilis NOT DETECTED NOT DETECTED Final   Enterobacterales NOT DETECTED NOT DETECTED Final   Enterobacter cloacae complex NOT DETECTED NOT DETECTED Final   Escherichia coli NOT DETECTED NOT DETECTED Final   Klebsiella aerogenes NOT DETECTED NOT DETECTED Final   Klebsiella oxytoca NOT DETECTED NOT DETECTED Final   Klebsiella pneumoniae NOT DETECTED NOT DETECTED Final   Proteus species NOT DETECTED NOT DETECTED Final   Salmonella species NOT DETECTED NOT DETECTED Final   Serratia marcescens NOT DETECTED NOT DETECTED Final   Haemophilus influenzae NOT DETECTED NOT DETECTED Final   Neisseria meningitidis NOT DETECTED NOT DETECTED Final   Pseudomonas aeruginosa NOT DETECTED NOT DETECTED Final   Stenotrophomonas maltophilia NOT DETECTED NOT DETECTED Final   Candida albicans NOT DETECTED NOT DETECTED Final   Candida auris NOT DETECTED NOT DETECTED Final   Candida glabrata NOT DETECTED NOT DETECTED Final   Candida krusei NOT DETECTED NOT DETECTED Final   Candida parapsilosis NOT DETECTED NOT DETECTED Final   Candida tropicalis NOT DETECTED NOT DETECTED Final   Cryptococcus neoformans/gattii NOT DETECTED NOT DETECTED Final   Meth resistant mecA/C  and MREJ NOT DETECTED NOT DETECTED Final    Comment: Performed at [facility], [address], [place], [place] [address]  Resp Panel by RT-PCR (Flu A&B, Covid) Nasopharyngeal Swab     Status: None   Collection Time: 05/24/20  3:14 PM   Specimen: Nasopharyngeal Swab; Nasopharyngeal(NP) swabs in vial transport medium  Result Value Ref Range Status   SARS Coronavirus 2 by RT PCR NEGATIVE NEGATIVE Final    Comment: (NOTE) SARS-CoV-2 target nucleic acids are NOT DETECTED.  The SARS-CoV-2 RNA is generally detectable in upper respiratory specimens during the acute phase of infection. The lowest concentration of SARS-CoV-2 viral copies this assay can detect is 138 copies/mL. A negative result does not preclude SARS-Cov-2 infection and should not be used as the sole basis for treatment or other patient management decisions. A negative result may occur with  improper specimen collection/handling, submission of specimen other than nasopharyngeal swab, presence of viral mutation(s) within the areas targeted by this assay, and inadequate number of viral copies(<138 copies/mL). A negative result must be combined with clinical observations, patient history, and epidemiological information. The expected result is Negative.  Fact Sheet for Patients:  BloggerCourse.com  Fact Sheet for Healthcare Providers:  SeriousBroker.it  This test is no t yet approved or cleared by the [place] FDA and  has been authorized for detection and/or diagnosis of SARS-CoV-2 by FDA under an Emergency Use Authorization (EUA). This EUA will remain  in effect (meaning this test can be used) for the duration of the COVID-19 declaration under Section 564(b)(1) of the Act, 21 U.S.C.section 360bbb-3(b)(1), unless the authorization is terminated  or revoked sooner.       Influenza A by PCR NEGATIVE NEGATIVE Final   Influenza B by PCR NEGATIVE NEGATIVE Final     Comment: (NOTE) The Xpert Xpress SARS-CoV-2/FLU/RSV plus assay is intended as an aid  in the diagnosis of influenza from Nasopharyngeal swab specimens and should not be used as a sole basis for treatment. Nasal washings and aspirates are unacceptable for Xpert Xpress SARS-CoV-2/FLU/RSV testing.  Fact Sheet for Patients: BloggerCourse.comhttps://www.fda.gov/media/152166/download  Fact Sheet for Healthcare Providers: SeriousBroker.ithttps://www.fda.gov/media/152162/download  This test is not yet approved or cleared by the [place] FDA and has been authorized for detection and/or diagnosis of SARS-CoV-2 by FDA under an Emergency Use Authorization (EUA). This EUA will remain in effect (meaning this test can be used) for the duration of the COVID-19 declaration under Section 564(b)(1) of the Act, 21 U.S.C. section 360bbb-3(b)(1), unless the authorization is terminated or revoked.  Performed at [facility], [address] [place], [place] [address]   Blood Culture (routine x 2)     Status: Abnormal   Collection Time: 05/24/20  9:45 PM   Specimen: BLOOD LEFT HAND  Result Value Ref Range Status   Specimen Description BLOOD LEFT HAND  Final   Special Requests   Final    BOTTLES DRAWN AEROBIC AND ANAEROBIC Blood Culture results may not be optimal due to an inadequate volume of blood received in culture bottles   Culture  Setup Time   Final    GRAM POSITIVE COCCI IN CLUSTERS IN BOTH AEROBIC AND ANAEROBIC BOTTLES CRITICAL VALUE NOTED.  VALUE IS CONSISTENT WITH PREVIOUSLY REPORTED AND CALLED VALUE.    Culture (A)  Final    STAPHYLOCOCCUS AUREUS SUSCEPTIBILITIES PERFORMED ON PREVIOUS CULTURE WITHIN THE LAST 5 DAYS. Performed at [facility], [address] [place], [place] [address]    Report Status 05/27/2020 FINAL  Final  MRSA PCR Screening     Status: None   Collection Time: 05/24/20 11:46 PM   Specimen: Nasopharyngeal  Result Value Ref Range Status   MRSA by PCR NEGATIVE NEGATIVE Final     Comment:        The GeneXpert MRSA Assay (FDA approved for NASAL specimens only), is one component of a comprehensive MRSA colonization surveillance program. It is not intended to diagnose MRSA infection nor to guide or monitor treatment for MRSA infections. Performed at [facility], [address] [place], [place] [address]   Urine culture     Status: None   Collection Time: 05/24/20 11:59 PM   Specimen: In/Out Cath Urine  Result Value Ref Range Status   Specimen Description IN/OUT CATH URINE  Final   Special Requests NONE  Final   Culture   Final    NO GROWTH Performed at [facility], [address] [place], [place] [address]    Report Status 05/26/2020 FINAL  Final  Culture, blood (routine x 2)     Status: None   Collection Time: 05/25/20 10:19 PM   Specimen: BLOOD LEFT HAND  Result Value Ref Range Status   Specimen Description BLOOD LEFT HAND  Final   Special Requests   Final    BOTTLES DRAWN AEROBIC AND ANAEROBIC Blood Culture adequate volume   Culture   Final    NO GROWTH 5 DAYS Performed at [facility], [address] [place], [place] [address]    Report Status 05/30/2020 FINAL  Final  Culture, blood (routine x 2)     Status: None   Collection Time: 05/25/20 10:32 PM   Specimen: BLOOD RIGHT HAND  Result Value Ref Range Status   Specimen Description BLOOD RIGHT HAND  Final   Special Requests AEROBIC BOTTLE ONLY Blood Culture adequate  volume  Final   Culture   Final    NO GROWTH 5 DAYS Performed at [facility], [address], [place], [place] [address]    Report Status 05/30/2020 FINAL  Final  Expectorated Sputum Assessment w Gram Stain, Rflx to Resp Cult     Status: None   Collection Time: 05/29/20  7:11 AM   Specimen: Expectorated Sputum  Result Value Ref Range Status   Specimen Description EXPECTORATED SPUTUM  Final   Special Requests Normal  Final   Sputum evaluation   Final    THIS SPECIMEN IS ACCEPTABLE FOR SPUTUM  CULTURE Performed at [facility], [address], [place], [place] [address]    Report Status 05/29/2020 FINAL  Final  Culture, Respiratory w Gram Stain     Status: None (Preliminary result)   Collection Time: 05/29/20  7:11 AM  Result Value Ref Range Status   Specimen Description EXPECTORATED SPUTUM  Final   Special Requests Normal Reflexed from [id number]  Final   Gram Stain   Final    ABUNDANT WBC PRESENT,BOTH PMN AND MONONUCLEAR FEW SQUAMOUS EPITHELIAL CELLS PRESENT ABUNDANT GRAM NEGATIVE RODS ABUNDANT GRAM POSITIVE COCCI FEW YEAST    Culture   Final    FEW Consistent with normal respiratory flora. No Pseudomonas species isolated Performed at [facility], [address] [place], [place] [address]    Report Status PENDING  Incomplete  Culture, blood (routine x 2)     Status: None (Preliminary result)   Collection Time: 05/31/20  8:07 AM   Specimen: BLOOD LEFT HAND  Result Value Ref Range Status   Specimen Description BLOOD LEFT HAND  Final   Special Requests   Final    BOTTLES DRAWN AEROBIC AND ANAEROBIC Blood Culture adequate volume   Culture   Final    NO GROWTH < 12 HOURS Performed at [facility], [address] [place], [place] [address]    Report Status PENDING  Incomplete  Culture, blood (routine x 2)     Status: None (Preliminary result)   Collection Time: 05/31/20  8:12 AM   Specimen: BLOOD RIGHT HAND  Result Value Ref Range Status   Specimen Description BLOOD RIGHT HAND  Final   Special Requests   Final    BOTTLES DRAWN AEROBIC ONLY Blood Culture results may not be optimal due to an inadequate volume of blood received in culture bottles   Culture   Final    NO GROWTH < 12 HOURS Performed at [facility], [address] [place], [place] [address]    Report Status PENDING  Incomplete  Gram stain     Status: None   Collection Time: 05/31/20 11:52 AM   Specimen: Fluid  Result Value Ref Range Status   Specimen Description FLUID  Final    Special Requests PLEURAL FLUID LEFT  Final   Gram Stain   Final    FEW WBC PRESENT, PREDOMINANTLY PMN NO ORGANISMS SEEN Performed at [facility], [address] [place], [place] [address]    Report Status 05/31/2020 FINAL  Final    Studies/Results: DG Chest 1 View  Result Date: 05/31/2020 CLINICAL DATA:  50 year old male status post left thoracentesis. Follow-up study. EXAM: CHEST  1 VIEW COMPARISON:  Chest x-ray 05/31/2020. FINDINGS: Previously noted left pleural effusion has decreased and is now very small. No appreciable left-sided pneumothorax. Trace right pleural effusion. Small bore pigtail drainage catheter in the inferior aspect  of the right hemithorax. Several areas of airspace consolidation and cavitation are again noted in the lungs bilaterally, similar to the prior study. No pneumothorax. No evidence of pulmonary edema. Heart size is normal. Upper mediastinal contours are within normal limits. IMPRESSION: 1. Decreased now small left pleural effusion following left-sided thoracentesis. No pneumothorax. Multilobar bilateral cavitary pneumonia, similar to the prior examination. Electronically Signed   By: Trudie Reed M.D.   On: 05/31/2020 12:24   CT CHEST WO CONTRAST  Result Date: 05/30/2020 CLINICAL DATA:  Respiratory failure. Staph aureus bacteremia. EXAM: CT CHEST WITHOUT CONTRAST TECHNIQUE: Multidetector CT imaging of the chest was performed following the standard protocol without IV contrast. COMPARISON:  May 24, 2020 FINDINGS: Cardiovascular: Mildly enlarged cardiac silhouette. No sizable pericardial effusion. Mediastinum/Nodes: Borderline enlarged precarinal lymph node measures 10 mm in short axis, likely reactive. Other shotty mediastinal lymph nodes are present. Trachea and main bronchi are patent. The esophagus is normal. Lungs/Pleura: Interval worsening of the aeration of the lungs with numerous predominantly peripheral areas of nodular airspace consolidation, some  confluent in the right upper lobe, lingula and bilateral lower lobes. There has been interval development of cavitations within multiple of these areas of airspace consolidation. There are moderate in size bilateral pleural effusions. Overall the aeration of the lungs has worsened. Upper Abdomen: No acute abnormality. Musculoskeletal: No chest wall mass or suspicious bone lesions identified. IMPRESSION: 1. Interval worsening of the aeration of the lungs with numerous predominantly peripheral areas of nodular and confluent airspace consolidation. Interval development of cavitations within multiple of these areas of airspace consolidation. These findings are most consistent with worsening multifocal pneumonia and numerous septic emboli. 2. Moderate in size bilateral pleural effusions. 3. Borderline enlarged precarinal lymph node, likely reactive. 4. Mild cardiomegaly. Electronically Signed   By: Ted Mcalpine M.D.   On: 05/30/2020 12:28   DG CHEST PORT 1 VIEW  Result Date: 05/31/2020 CLINICAL DATA:  Chest tube in place EXAM: PORTABLE CHEST 1 VIEW COMPARISON:  May 30, 2020 chest radiograph and chest CT FINDINGS: Pigtail catheter noted at right base, unchanged. No pneumothorax. Multiple opacities throughout the lungs, some of which are cavitated, persist without change. There is consolidation in the left base with small left pleural effusion. Partial clearing of opacity from right base compared to 1 day prior. Heart is mildly enlarged with pulmonary vascularity within normal limits. No adenopathy. No bone lesions. IMPRESSION: Pigtail catheter at right base. No pneumothorax. Multiple cavitary lesions, likely septic emboli, persist. Consolidation left lower lung region with small left pleural effusion. There has been partial clearing of opacity from the right base compared to 1 day prior. Stable cardiac silhouette. Electronically Signed   By: Bretta Bang III M.D.   On: 05/31/2020 08:07   DG CHEST PORT 1  VIEW  Result Date: 05/30/2020 CLINICAL DATA:  Shortness of breath. EXAM: PORTABLE CHEST 1 VIEW COMPARISON:  05/28/2020 FINDINGS: Diffuse patchy and nodular bilateral airspace disease is stable. Some of the nodules are cavitated. The cardiopericardial silhouette is within normal limits for size. Pleural drain noted over the right lower hemithorax. Telemetry leads overlie the chest. IMPRESSION: No substantial interval change diffuse bilateral patchy and nodular airspace disease bilaterally. Electronically Signed   By: Kennith Center M.D.   On: 05/30/2020 15:29   US THORACENTESIS ASP PLEURAL SPACE W/IMG GUIDE  Result Date: 05/31/2020 INDICATION: Patient with history of sepsis, MSSA bacteremia, acute hypoxic respiratory failure, bilateral pneumonia, and bilateral pleural effusions s/p right chest tube placement in IR  05/30/2020. Request is made for diagnostic and therapeutic left thoracentesis. EXAM: ULTRASOUND GUIDED DIAGNOSTIC AND THERAPEUTIC LEFT THORACENTESIS MEDICATIONS: 10 mL 1% lidocaine COMPLICATIONS: None immediate. PROCEDURE: An ultrasound guided thoracentesis was thoroughly discussed with the patient and questions answered. The benefits, risks, alternatives and complications were also discussed. The patient understands and wishes to proceed with the procedure. Written consent was obtained. Ultrasound was performed to localize and mark an adequate pocket of fluid in the left chest. The area was then prepped and draped in the normal sterile fashion. 1% Lidocaine was used for local anesthesia. Under ultrasound guidance a 6 Fr Safe-T-Centesis catheter was introduced. Thoracentesis was performed. The catheter was removed and a dressing applied. FINDINGS: A total of approximately 550 mL of dark red fluid was removed. Samples were sent to the laboratory as requested by the clinical team. IMPRESSION: Successful ultrasound guided left thoracentesis yielding 550 mL of pleural fluid. Read by: Elwin Mocha, PA-C  Electronically Signed   By: Marliss Coots MD   On: 05/31/2020 12:30   US THORACENTESIS ASP PLEURAL SPACE W/IMG GUIDE  Result Date: 05/30/2020 INDICATION: 50 year old male with bacteremia, pulmonary septic emboli, and bilateral pleural effusions, right greater than left. EXAM: 1. Ultrasound-guided puncture of right pleural space. 2. Placement of right thoracostomy tube. MEDICATIONS: The patient is currently admitted to the hospital and receiving intravenous antibiotics. The antibiotics were administered within an appropriate time frame prior to the initiation of the procedure. ANESTHESIA/SEDATION: Local anesthesia only. COMPLICATIONS: None immediate. PROCEDURE: Informed written consent was obtained from the patient after a thorough discussion of the procedural risks, benefits and alternatives. All questions were addressed. Maximal Sterile Barrier Technique was utilized including caps, mask, sterile gowns, sterile gloves, sterile drape, hand hygiene and skin antiseptic. A timeout was performed prior to the initiation of the procedure. Preprocedure ultrasound evaluation demonstrated mildly complex fluid collection within the right pleural space of small to moderate volume. Given complexity of fluid, a pigtail thoracostomy tube was requested by the ordering provider. The procedure was planned. The right lower back was prepped and draped in standard fashion. Subdermal Local anesthesia was provided at the planned needle entry site with 1% lidocaine. Deeper local anesthetic was administered under ultrasound guidance along the pleura at the planned needle entry site. A small skin nick was made. Under direct ultrasound visualization, a 7 cm, 5 [place] Yueh needle was directed into the right pleural space. A stiff Amplatz wire was inserted through the Yueh catheter and serial dilation was performed prior to placement of 10.2 French pigtail thoracostomy tube. The pigtail portion was formed. Ultrasound evaluation demonstrated  position of the catheter within the pleural space. There was immediate E flux of translucent, serosanguineous fluid. The catheter was connected to a pleura vac and set to wall suction. There was immediate evacuation of approximately 300 mL of fluid. The drainage catheter was sutured at the skin entry site and a sterile bandage was applied with nonocclusive gauze around the skin entry site. The patient tolerated the procedure well was transferred back to the floor in stable condition. IMPRESSION: Technically successful right basilar pigtail thoracostomy tube (10.2 Fr) placement. 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  While his procalcitonin has trended down his white cells however now at 45,000 and he is spiking temperatures to nearly 103 degrees.  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

## 2020-05-31 NOTE — Progress Notes (Signed)
Referring Physician(s): Steffanie Dunn (CCM)  Supervising Physician: Marliss Coots  Patient Status:  [facility] - In-pt  Chief Complaint: Right chest tube F/U  Subjective:  Bilateral pneumonia complicated by loculated right pleural effusion s/p right chest tube placement in IR 05/30/2020. Patient awake and alert sitting on edge of bed for left thoracentesis in [place]. Tachypnic, sating 90-97% on 4L [place]. Right chest tube site c/d/i.  CXR this AM: 1. Pigtail catheter at right base. No pneumothorax. Multiple cavitary lesions, likely septic emboli, persist. Consolidation left lower lung region with small left pleural effusion. There has been partial clearing of opacity from the right base compared to 1 day prior. Stable cardiac silhouette.   Allergies: Patient has no known allergies.  Medications: Prior to Admission medications   Not on File     Vital Signs: BP 126/81 (BP Location: Left Arm)   Pulse 98   Temp 100.2 F (37.9 C) (Oral)   Resp (!) 25   Ht 6\' 3"  (1.905 m)   Wt 151 lb 14.4 oz (68.9 kg)   SpO2 92%   BMI 18.99 kg/m   Physical Exam Vitals and nursing note reviewed.  Constitutional:      General: He is not in acute distress. Pulmonary:     Comments: Tachypnic, sating 90-97% on 4L [place]. Right chest tube site without tenderness, erythema, drainage, or active bleeding; tube to pleure-vac however is  clamped at this time for thrombolytic infusion, minimal output of bloody fluid in pleure-vac. Skin:    General: Skin is warm and dry.  Neurological:     Mental Status: He is alert and oriented to person, place, and time.     Imaging: CT HEAD WO CONTRAST  Result Date: 05/27/2020 CLINICAL DATA:  Mental status change, CNS infection suspected EXAM: CT HEAD WITHOUT CONTRAST TECHNIQUE: Contiguous axial images were obtained from the base of the skull through the vertex without intravenous contrast. COMPARISON:  None. FINDINGS: Brain: No evidence of large-territorial acute  infarction. No parenchymal hemorrhage. No mass lesion. No extra-axial collection. No mass effect or midline shift. No hydrocephalus. Basilar cisterns are patent. Vascular: No hyperdense vessel. Skull: No acute fracture or focal lesion. Sinuses/Orbits: Paranasal sinuses and mastoid air cells are clear. The orbits are unremarkable. Other: None. IMPRESSION: No acute intracranial abnormality. Electronically Signed   By: 07/27/2020 M.D.   On: 05/27/2020 19:45   CT CHEST WO CONTRAST  Result Date: 05/30/2020 CLINICAL DATA:  Respiratory failure. Staph aureus bacteremia. EXAM: CT CHEST WITHOUT CONTRAST TECHNIQUE: Multidetector CT imaging of the chest was performed following the standard protocol without IV contrast. COMPARISON:  May 24, 2020 FINDINGS: Cardiovascular: Mildly enlarged cardiac silhouette. No sizable pericardial effusion. Mediastinum/Nodes: Borderline enlarged precarinal lymph node measures 10 mm in short axis, likely reactive. Other shotty mediastinal lymph nodes are present. Trachea and main bronchi are patent. The esophagus is normal. Lungs/Pleura: Interval worsening of the aeration of the lungs with numerous predominantly peripheral areas of nodular airspace consolidation, some confluent in the right upper lobe, lingula and bilateral lower lobes. There has been interval development of cavitations within multiple of these areas of airspace consolidation. There are moderate in size bilateral pleural effusions. Overall the aeration of the lungs has worsened. Upper Abdomen: No acute abnormality. Musculoskeletal: No chest wall mass or suspicious bone lesions identified. IMPRESSION: 1. Interval worsening of the aeration of the lungs with numerous predominantly peripheral areas of nodular and confluent airspace consolidation. Interval development of cavitations within multiple of these areas  of airspace consolidation. These findings are most consistent with worsening multifocal pneumonia and numerous  septic emboli. 2. Moderate in size bilateral pleural effusions. 3. Borderline enlarged precarinal lymph node, likely reactive. 4. Mild cardiomegaly. Electronically Signed   By: Ted Mcalpine M.D.   On: 05/30/2020 12:28   DG CHEST PORT 1 VIEW  Result Date: 05/31/2020 CLINICAL DATA:  Chest tube in place EXAM: PORTABLE CHEST 1 VIEW COMPARISON:  May 30, 2020 chest radiograph and chest CT FINDINGS: Pigtail catheter noted at right base, unchanged. No pneumothorax. Multiple opacities throughout the lungs, some of which are cavitated, persist without change. There is consolidation in the left base with small left pleural effusion. Partial clearing of opacity from right base compared to 1 day prior. Heart is mildly enlarged with pulmonary vascularity within normal limits. No adenopathy. No bone lesions. IMPRESSION: Pigtail catheter at right base. No pneumothorax. Multiple cavitary lesions, likely septic emboli, persist. Consolidation left lower lung region with small left pleural effusion. There has been partial clearing of opacity from the right base compared to 1 day prior. Stable cardiac silhouette. Electronically Signed   By: Bretta Bang III M.D.   On: 05/31/2020 08:07   DG CHEST PORT 1 VIEW  Result Date: 05/30/2020 CLINICAL DATA:  Shortness of breath. EXAM: PORTABLE CHEST 1 VIEW COMPARISON:  05/28/2020 FINDINGS: Diffuse patchy and nodular bilateral airspace disease is stable. Some of the nodules are cavitated. The cardiopericardial silhouette is within normal limits for size. Pleural drain noted over the right lower hemithorax. Telemetry leads overlie the chest. IMPRESSION: No substantial interval change diffuse bilateral patchy and nodular airspace disease bilaterally. Electronically Signed   By: Kennith Center M.D.   On: 05/30/2020 15:29   DG CHEST PORT 1 VIEW  Result Date: 05/28/2020 CLINICAL DATA:  Short of breath, hypoxia EXAM: PORTABLE CHEST 1 VIEW COMPARISON:  Prior chest x-ray 05/26/2020;  CT chest 05/24/2020 FINDINGS: Extensive bilateral nodular opacities and areas of geographic pulmonary airspace opacity. Probable small bilateral pleural effusions. Stable cardiac and mediastinal contours. No evidence of pneumothorax. No significant interval change compared to 05/26/2020. No acute osseous abnormality. IMPRESSION: No significant interval change in the appearance of the chest compared to 05/26/2020. Persistent diffuse bilateral nodular and geographic areas of airspace infiltrate. Imaging findings most suggestive of septic emboli. Small bilateral pleural effusions. Electronically Signed   By: Malachy Moan M.D.   On: 05/28/2020 11:13   ECHO TEE  Result Date: 05/27/2020    TRANSESOPHOGEAL ECHO REPORT   Patient Name:   Edward Mueller Date of Exam: 05/27/2020 Medical Rec #:  [id number]      Height:       75.0 in Accession #:    [id number]     Weight:       150.0 lb Date of Birth:  [birth year]      BSA:          1.942 m Patient Age:    49 years       BP:           106/59 mmHg Patient Gender: M              HR:           104 bpm. Exam Location:  Inpatient Procedure: Transesophageal Echo, Color Doppler and Cardiac Doppler Indications:     Bacteremia R78.81  History:         Patient has prior history of Echocardiogram examinations, most  recent 05/25/2020.  Sonographer:     Lavenia AtlasBrooke Strickland Referring Phys:  [id number] Dorcas CarrowKUBER GHIMIRE Diagnosing Phys: Thurmon FairMihai Croitoru MD PROCEDURE: After discussion of the risks and benefits of a TEE, an informed consent was obtained. The transesophogeal probe was passed without difficulty through the esophogus of the patient. Sedation performed by different physician. The patient was monitored while under deep sedation. Anesthestetic sedation was provided intravenously by Anesthesiology: 234.6mg  of Propofol. The patient's vital signs; including heart rate, blood pressure, and oxygen saturation; remained stable throughout the procedure. The patient developed no  complications during the procedure. IMPRESSIONS  1. Left ventricular ejection fraction, by estimation, is 60 to 65%. The left ventricle has normal function. The left ventricle has no regional wall motion abnormalities.  2. Right ventricular systolic function is normal. The right ventricular size is normal.  3. No left atrial/left atrial appendage thrombus was detected.  4. The mitral valve is normal in structure. Trivial mitral valve regurgitation. No evidence of mitral stenosis.  5. The aortic valve is normal in structure. Aortic valve regurgitation is not visualized. No aortic stenosis is present.  6. The inferior vena cava is normal in size with greater than 50% respiratory variability, suggesting right atrial pressure of 3 mmHg. Conclusion(s)/Recommendation(s): Normal biventricular function without evidence of hemodynamically significant valvular heart disease. No evidence of vegetation/infective endocarditis on this transesophageal echocardiogram. FINDINGS  Left Ventricle: Left ventricular ejection fraction, by estimation, is 60 to 65%. The left ventricle has normal function. The left ventricle has no regional wall motion abnormalities. The left ventricular internal cavity size was normal in size. There is  no left ventricular hypertrophy. Right Ventricle: The right ventricular size is normal. No increase in right ventricular wall thickness. Right ventricular systolic function is normal. Left Atrium: Left atrial size was normal in size. No left atrial/left atrial appendage thrombus was detected. Right Atrium: Right atrial size was normal in size. Pericardium: There is no evidence of pericardial effusion. Mitral Valve: The mitral valve is normal in structure. Trivial mitral valve regurgitation. No evidence of mitral valve stenosis. Tricuspid Valve: The tricuspid valve is normal in structure. Tricuspid valve regurgitation is not demonstrated. No evidence of tricuspid stenosis. Aortic Valve: The aortic valve is  normal in structure. Aortic valve regurgitation is not visualized. No aortic stenosis is present. Pulmonic Valve: The pulmonic valve was normal in structure. Pulmonic valve regurgitation is not visualized. No evidence of pulmonic stenosis. Aorta: The aortic root is normal in size and structure. Venous: The inferior vena cava is normal in size with greater than 50% respiratory variability, suggesting right atrial pressure of 3 mmHg. IAS/Shunts: No atrial level shunt detected by color flow Doppler. Thurmon FairMihai Croitoru MD Electronically signed by Thurmon FairMihai Croitoru MD Signature Date/Time: 05/27/2020/5:10:51 PM    Final    US THORACENTESIS ASP PLEURAL SPACE W/IMG GUIDE  Result Date: 05/30/2020 INDICATION: 50 year old male with bacteremia, pulmonary septic emboli, and bilateral pleural effusions, right greater than left. EXAM: 1. Ultrasound-guided puncture of right pleural space. 2. Placement of right thoracostomy tube. MEDICATIONS: The patient is currently admitted to the hospital and receiving intravenous antibiotics. The antibiotics were administered within an appropriate time frame prior to the initiation of the procedure. ANESTHESIA/SEDATION: Local anesthesia only. COMPLICATIONS: None immediate. PROCEDURE: Informed written consent was obtained from the patient after a thorough discussion of the procedural risks, benefits and alternatives. All questions were addressed. Maximal Sterile Barrier Technique was utilized including caps, mask, sterile gowns, sterile gloves, sterile drape, hand hygiene and skin antiseptic. A timeout was  performed prior to the initiation of the procedure. Preprocedure ultrasound evaluation demonstrated mildly complex fluid collection within the right pleural space of small to moderate volume. Given complexity of fluid, a pigtail thoracostomy tube was requested by the ordering provider. The procedure was planned. The right lower back was prepped and draped in standard fashion. Subdermal Local  anesthesia was provided at the planned needle entry site with 1% lidocaine. Deeper local anesthetic was administered under ultrasound guidance along the pleura at the planned needle entry site. A small skin nick was made. Under direct ultrasound visualization, a 7 cm, 5 [place] Yueh needle was directed into the right pleural space. A stiff Amplatz wire was inserted through the Yueh catheter and serial dilation was performed prior to placement of 10.2 French pigtail thoracostomy tube. The pigtail portion was formed. Ultrasound evaluation demonstrated position of the catheter within the pleural space. There was immediate E flux of translucent, serosanguineous fluid. The catheter was connected to a pleura vac and set to wall suction. There was immediate evacuation of approximately 300 mL of fluid. The drainage catheter was sutured at the skin entry site and a sterile bandage was applied with nonocclusive gauze around the skin entry site. The patient tolerated the procedure well was transferred back to the floor in stable condition. IMPRESSION: Technically successful right basilar pigtail thoracostomy tube (10.2 Fr) placement. Marliss Coots, MD [facility] Radiology Electronically Signed   By: Marliss Coots MD   On: 05/30/2020 16:06    Labs:  CBC: Recent Labs    05/28/20 0255 05/29/20 0308 05/30/20 0209 05/31/20 0124  WBC 16.4* 25.0* 34.8* 45.1*  HGB 12.5* 11.9* 12.5* 12.6*  HCT 35.8* 33.8* 36.5* 36.0*  PLT PLATELET CLUMPS NOTED ON SMEAR, UNABLE TO ESTIMATE 140* 194 334    COAGS: Recent Labs    05/24/20 1417  INR 1.1  APTT 28    BMP: Recent Labs    05/28/20 0255 05/29/20 0308 05/30/20 0209 05/31/20 0124  NA 135 134* 136 135  K 3.7 3.4* 3.5 3.7  CL 102 101 102 99  CO2 21* 26 28 28   GLUCOSE 111* 122* 111* 132*  BUN 48* 44* 38* 37*  CALCIUM 7.4* 7.4* 7.4* 7.4*  CREATININE 1.50* 1.51* 1.41* 1.46*  GFRNONAA 56* 56* >60 58*    LIVER  FUNCTION TESTS: Recent Labs    05/24/20 1417 05/31/20 0124  BILITOT 1.2 0.7  AST 35 25  ALT 24 26  ALKPHOS 56 54  PROT 6.0* 4.4*  ALBUMIN 2.6* 1.4*    Assessment and Plan:  Bilateral pneumonia complicated by loculated right pleural effusion s/p right chest tube placement in IR 05/30/2020. Right chest tube stable, clamped at this time for thrombolytic infusion. Continue current chest tube management- tube to suction (unless clamped for thrombolytics- per CCM), continue with serial CXR, further chest tube management per CCM. Further plans per TRH/CCM- appreciate and agree with management. IR will continue to follow peripherally, please call IR with questions/concerns.   Electronically Signed: 07/30/2020, PA-C 05/31/2020, 11:19 AM   I spent a total of 15 Minutes at the the patient's bedside AND on the patient's hospital floor or unit, greater than 50% of which was counseling/coordinating care for loculated right pleural effusion s/p right chest tube placement.

## 2020-05-31 NOTE — Procedures (Signed)
PROCEDURE SUMMARY:  Successful image-guided left thoracentesis. Yielded 550 milliliters of dark red fluid. Patient tolerated procedure well. No immediate complications. EBL = 0 mL.  Specimen was sent for labs. CXR ordered.  Please see imaging section of Epic for full dictation.   Gordy Councilman Pieper Kasik PA-C 05/31/2020 11:55 AM

## 2020-05-31 NOTE — Procedures (Signed)
Pleural Fibrinolytic Administration Procedure Note  Edward Mueller  [id number]  March 21, 1970  Date:05/31/20  Time:4:18 PM   Provider Performing:Kalin Kyler F Earlene Plater   Procedure: Pleural Fibrinolysis Subsequent day [phone]) - Second dose   Indication(s) Fibrinolysis of complicated pleural effusion  Consent Risks of the procedure as well as the alternatives and risks of each were explained to the patient and/or caregiver.  Consent for the procedure was obtained.   Anesthesia None   Time Out Verified patient identification, verified procedure, site/side was marked, verified correct patient position, special equipment/implants available, medications/allergies/relevant history reviewed, required imaging and test results available.   Sterile Technique Hand hygiene, gloves   Procedure Description Existing pleural catheter was cleaned and accessed in sterile manner.  10mg  of tPA in 30cc of saline and 5mg  of dornase in 30cc of sterile water were injected into pleural space using existing pleural catheter.  Catheter will be clamped for 1 hour and then placed back to suction.   Complications/Tolerance None; patient tolerated the procedure well.  EBL None   Specimen(s) None   , NP-C [facility] Personal contact information can be found on Amion  05/31/2020, 4:18 PM

## 2020-06-01 DIAGNOSIS — R509 Fever, unspecified: Secondary | ICD-10-CM

## 2020-06-01 DIAGNOSIS — B9561 Methicillin susceptible Staphylococcus aureus infection as the cause of diseases classified elsewhere: Secondary | ICD-10-CM

## 2020-06-01 LAB — CBC WITH DIFFERENTIAL/PLATELET
Abs Immature Granulocytes: 2.67 10*3/uL — ABNORMAL HIGH (ref 0.00–0.07)
Basophils Absolute: 0 10*3/uL (ref 0.0–0.1)
Basophils Relative: 0 %
Eosinophils Absolute: 0 10*3/uL (ref 0.0–0.5)
Eosinophils Relative: 0 %
HCT: 33.9 % — ABNORMAL LOW (ref 39.0–52.0)
Hemoglobin: 11.6 g/dL — ABNORMAL LOW (ref 13.0–17.0)
Immature Granulocytes: 6 %
Lymphocytes Relative: 2 %
Lymphs Abs: 1 10*3/uL (ref 0.7–4.0)
MCH: 31.9 pg (ref 26.0–34.0)
MCHC: 34.2 g/dL (ref 30.0–36.0)
MCV: 93.1 fL (ref 80.0–100.0)
Monocytes Absolute: 2.3 10*3/uL — ABNORMAL HIGH (ref 0.1–1.0)
Monocytes Relative: 5 %
Neutro Abs: 38.3 10*3/uL — ABNORMAL HIGH (ref 1.7–7.7)
Neutrophils Relative %: 87 %
Platelets: 422 10*3/uL — ABNORMAL HIGH (ref 150–400)
RBC: 3.64 MIL/uL — ABNORMAL LOW (ref 4.22–5.81)
RDW: 13.6 % (ref 11.5–15.5)
WBC: 44.2 10*3/uL — ABNORMAL HIGH (ref 4.0–10.5)
nRBC: 0 % (ref 0.0–0.2)

## 2020-06-01 LAB — BASIC METABOLIC PANEL
Anion gap: 6 (ref 5–15)
BUN: 39 mg/dL — ABNORMAL HIGH (ref 6–20)
CO2: 27 mmol/L (ref 22–32)
Calcium: 7.1 mg/dL — ABNORMAL LOW (ref 8.9–10.3)
Chloride: 98 mmol/L (ref 98–111)
Creatinine, Ser: 1.38 mg/dL — ABNORMAL HIGH (ref 0.61–1.24)
GFR, Estimated: >60 mL/min (ref >60)
Glucose, Bld: 123 mg/dL — ABNORMAL HIGH (ref 70–99)
Potassium: 3.7 mmol/L (ref 3.5–5.1)
Sodium: 131 mmol/L — ABNORMAL LOW (ref 135–145)

## 2020-06-01 LAB — PATHOLOGIST SMEAR REVIEW

## 2020-06-01 LAB — CULTURE, RESPIRATORY W GRAM STAIN
Culture: NORMAL
Special Requests: NORMAL

## 2020-06-01 NOTE — Progress Notes (Signed)
PROGRESS NOTE  Edward Mueller MGQ:[id number] DOB: [birth year] DOA: 05/24/2020 PCP: Pcp, No  HPI/Recap of past 57 hours: 50 year old gentleman with no reported medical problems who donates plasma 2 times a week, donated plasma on 05/22/20 and after about 24 hours he started not feeling well.  Reported symptoms included generalized malaise, nausea, vomiting, shortness of breath, pleuritic chest pain, cough with blood-tinged sputum.  Came to the emergency room on 05/24/20, was febrile, tachypneic with lactic acidosis.  Found to have multilobular pneumonia on chest x-ray, left upper extremity cellulitis and left acute superficial vein thrombosis involving the left cephalic vein on 03/29/7149.   Was started on IV antibiotics and IV fluids and admitted to ICU for closer monitoring then later transferred to progressive care unit.  Work-up revealed MSSA bacteremia for which infectious disease was consulted.  Infective endocarditis was ruled out by TEE on 05/27/2020.  Due to worsening clinical picture PCCM was consulted.  Sputum culture from 05/29/2020 growing abundant gram-negative rods, abundant gram-positive cocci with few yeast, culture was reincubated for better growth.  He has been on cefazolin for MSSA bacteremia as recommended by infectious disease.  Repeated CT chest without contrast done on 05/30/2020 showed interval worsening of the aeration of the lungs.  IR was consulted for thoracentesis and chest tube placement due to loculated pleural effusion.    Post right thoracentesis by IR on 05/30/2020 with approximately 300 cc translucent, serosanguineous fluid removed and right chest tube placement.  Post left thoracentesis by IR on 05/31/2020 with 550 cc dark red fluid removed.  Chest tube is managed by PCCM.  06/01/20: Patient was seen and examined at his bedside.  He feels better this morning.  Lungs sounds improved.  O2 saturation upper 90s to 100% on 2 L.  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 Management per infectious disease. He is currently on IV cefazolin, continue as recommended by infectious disease. Repeated blood culture from 05/25/2020, negative final. Sputum culture obtained on 05/29/2020 revealed few consistent with normal respiratory flora, no Pseudomonas species isolated.  Continue to follow until finalized. Blood culture repeated on 05/31/2020 peripherally negative to date. WBC is downtrending  Acute hypoxic respiratory failure secondary to multifocal pneumonia, bilateral pleural effusions, and extensive bilateral septic pulmonary emboli D-dimer on presentation greater than 4 On IV antibiotics as stated above. Post left and right thoracentesis and right chest tube placement by IR. Continue to maintain O2 saturation greater than 90% Improving, O2 saturation in the high 90s to 100% on 2 L.  Bilateral pleural effusions post right thoracentesis and right thoracostomy tube placement by IR on 05/30/2020. Approximately 300 cc translucent, serosanguineous fluid removed. Management per IR and PCCM. Post left thoracentesis on 05/31/20 by IR. Continue to monitor chest tube output. Repeat chest x-ray in the morning  Resolved post repletion.  Hypocalcemia Calcium corrected for albumin 9.5.  Resolved Non anion gap metabolic acidosis  AKI on CKD 3B, improving. Presented with creatinine of 2.7 Creatinine downtrending Continue to monitor urine output Repeat renal panel in the morning.  Resolved mild hypovolemic hyponatremia  Encourage  increase in oral intake Serum sodium 134>> 136. Repeat BMP in the morning  Resolved post repletion: Hypokalemia Serum potassium 3.4>> 3.5. Repleted orally Serum magnesium 2.7 Repeat BMP in the morning  Refractory hypophosphatemia Encourage oral intake Serum phosphorus 2.4>> 2.5>2.4. Repleted.  Repeat phosphorus level in the morning  Resolved left upper extremity cellulitis Initially presented with left upper extremity cellulitis which has been treated and has now resolved  Left upper extremity acute superficial vein thrombosis Continue to monitor  THC/tobacco use UDS positive for THC on 05/24/2020 Denies use of cocaine or intravenous drug abuse Cessation counseling when hemodynamically stable.      Code Status: Full code.  Family Communication: Updated his mother in person on 05/31/2020.  Disposition Plan: Likely will discharge to home once oxygen requirement is improved and infectious disease/PCCM sign off.   Consultants:  Infectious disease.  PCCM  Procedures:  2D echo on 05/25/2020  TEE on 05/27/2020.  Antimicrobials:  Cefazolin for 05/25/20>>  Merrem/Zyvox 05/30/20, Dced 05/30/20 as recommended by ID.  DVT prophylaxis: Subcu heparin 3 times daily.  Status is: Inpatient    Dispo: The patient is from: Home.              Anticipated d/c is to: Home.              Patient currently not stable for discharge due to acute hypoxic respiratory failure, management of MSSA bacteremia, worsening multifocal pneumonia, bilateral pleural effusion, acute hypoxic respiratory failure.   Difficult to place patient: Not applicable.        Objective: Vitals:   06/01/20 0405 06/01/20 0821 06/01/20 1217 06/01/20 1556  BP: 121/65 126/76 133/80 (!) 141/78  Pulse: 99 92 (!) 101 (!) 103  Resp:  Temp:  99.9 F (37.7 C)    TempSrc:  Oral    SpO2: 94% 97% 98% 100%  Weight: 70.2 kg     Height:        Intake/Output Summary (Last 24 hours) at 06/01/2020 1802 Last  data filed at 05/31/2020 2000 Gross per 24 hour  Intake 0 ml  Output --  Net 0 ml   Filed Weights   05/24/20 1410 05/29/20 0500 06/01/20 0405  Weight: 68 kg 68.9 kg 70.2 kg    Exam:  . General: 50 y.o. year-old male weak appearing in no acute distress.  He is alert oriented x3.   . Cardiovascular: Regular rate and rhythm no rubs or gallops.  Marland Kitchen  Respiratory: Mild rales wheezes no wheezing noted.  Poor inspiratory effort.  Right chest tube in place .  Abdomen: Soft nontender normal bowel sounds. . Musculoskeletal: No lower extremity edema bilaterally.   . Skin: No ulcerative lesions noted. Marland Kitchen Psychiatry: Mood is appropriate for condition setting.  Data Reviewed: CBC: Recent Labs  Lab 05/28/20 0255 05/29/20 0308 05/30/20 0209 05/31/20 0124 06/01/20 0239  WBC 16.4* 25.0* 34.8* 45.1* 44.2*  NEUTROABS 12.9*  --   --  37.6* 38.3*  HGB 12.5* 11.9* 12.5* 12.6* 11.6*  HCT 35.8* 33.8* 36.5* 36.0* 33.9*  MCV 92.5 92.6 94.6 94.2 93.1  PLT PLATELET CLUMPS NOTED ON SMEAR, UNABLE TO ESTIMATE 140* 194 334 422*   Basic Metabolic Panel: Recent Labs  Lab 05/26/20 0304 05/27/20 0244 05/28/20 0255 05/29/20 0308 05/30/20 0209 05/31/20 0124 06/01/20 0239  NA 133* 133* 135 134* 136 135 131*  K 3.7 3.5 3.7 3.4* 3.5 3.7 3.7  CL 100 101 102 101 102 99 98  CO2 23  25 21* GLUCOSE 107* 116* 111* 122* 111* 132* 123*  BUN 42* 46* 48* 44* 38* 37* 39*  CREATININE 2.53* 1.90* 1.50* 1.51* 1.41* 1.46* 1.38*  CALCIUM 7.5* 7.6* 7.4* 7.4* 7.4* 7.4* 7.1*  MG 2.1 2.8* 3.0* 2.8* 2.7*  --   --   PHOS 3.5 2.4* 2.5 2.5 2.4*  --   --    GFR: Estimated Creatinine Clearance: 63.6 mL/min (A) (by C-G formula based on SCr of 1.38 mg/dL (H)). Liver Function Tests: Recent Labs  Lab 05/31/20 0124  AST 25  ALT 26  ALKPHOS 54  BILITOT 0.7  PROT 4.4*  ALBUMIN 1.4*   No results for input(s): LIPASE, AMYLASE in the last 168 hours. No results for input(s): AMMONIA in the last 168  hours. Coagulation Profile: No results for input(s): INR, PROTIME in the last 168 hours. Cardiac Enzymes: No results for input(s): CKTOTAL, CKMB, CKMBINDEX, TROPONINI in the last 168 hours. BNP (last 3 results) No results for input(s): PROBNP in the last 8760 hours. HbA1C: No results for input(s): HGBA1C in the last 72 hours. CBG: No results for input(s): GLUCAP in the last 168 hours. Lipid Profile: No results for input(s): CHOL, HDL, LDLCALC, TRIG, CHOLHDL, LDLDIRECT in the last 72 hours. Thyroid Function Tests: No results for input(s): TSH, T4TOTAL, FREET4, T3FREE, THYROIDAB in the last 72 hours. Anemia Panel: No results for input(s): VITAMINB12, FOLATE, FERRITIN, TIBC, IRON, RETICCTPCT in the last 72 hours. Urine analysis:    Component Value Date/Time   COLORURINE YELLOW 05/24/2020 0502   APPEARANCEUR HAZY (A) 05/24/2020 0502   LABSPEC 1.016 05/24/2020 0502   PHURINE 5.0 05/24/2020 0502   GLUCOSEU 50 (A) 05/24/2020 0502   HGBUR MODERATE (A) 05/24/2020 0502   BILIRUBINUR NEGATIVE 05/24/2020 0502   KETONESUR NEGATIVE 05/24/2020 0502   PROTEINUR 100 (A) 05/24/2020 0502   NITRITE NEGATIVE 05/24/2020 0502   LEUKOCYTESUR NEGATIVE 05/24/2020 0502   Sepsis Labs: (procalcitonin:4,lacticidven:4)  ) Recent Results (from the past 240 hour(s))  Blood Culture (routine x 2)     Status: Abnormal   Collection Time: 05/24/20  3:08 PM   Specimen: Left Antecubital; Blood  Result Value Ref Range Status   Specimen Description LEFT ANTECUBITAL  Final   Special Requests   Final    BOTTLES DRAWN AEROBIC AND ANAEROBIC Blood Culture results may not be optimal due to an inadequate volume of blood received in culture bottles   Culture  Setup Time   Final    GRAM POSITIVE COCCI IN CLUSTERS IN BOTH AEROBIC AND ANAEROBIC BOTTLES CRITICAL RESULT CALLED TO, READ BACK BY AND VERIFIED WITH: PHARMD A WOLF [id number] AT 754 AM BY CM Performed at [facility], [address],  [place], [place] [address]    Culture STAPHYLOCOCCUS AUREUS (A)  Final   Report Status 05/27/2020 FINAL  Final   Organism ID, Bacteria STAPHYLOCOCCUS AUREUS  Final      Susceptibility   Staphylococcus aureus - MIC*    CIPROFLOXACIN <=0.5 SENSITIVE Sensitive     ERYTHROMYCIN >=8 RESISTANT Resistant     GENTAMICIN <=0.5 SENSITIVE Sensitive     OXACILLIN 0.5 SENSITIVE Sensitive     TETRACYCLINE <=1 SENSITIVE Sensitive     VANCOMYCIN <=0.5 SENSITIVE Sensitive     TRIMETH/SULFA <=10 SENSITIVE Sensitive     CLINDAMYCIN <=0.25 SENSITIVE Sensitive     RIFAMPIN <=0.5 SENSITIVE Sensitive     Inducible Clindamycin NEGATIVE Sensitive     * STAPHYLOCOCCUS AUREUS  Blood  Culture ID Panel (Reflexed)     Status: Abnormal   Collection Time: 05/24/20  3:08 PM  Result Value Ref Range Status   Enterococcus faecalis NOT DETECTED NOT DETECTED Final   Enterococcus Faecium NOT DETECTED NOT DETECTED Final   Listeria monocytogenes NOT DETECTED NOT DETECTED Final   Staphylococcus species DETECTED (A) NOT DETECTED Final    Comment: CRITICAL RESULT CALLED TO, READ BACK BY AND VERIFIED WITH: PHARMD A WOLF [id number] AT 800 BY CM    Staphylococcus aureus (BCID) DETECTED (A) NOT DETECTED Final    Comment: CRITICAL RESULT CALLED TO, READ BACK BY AND VERIFIED WITH: PHARMD A WOLF [id number] AT 800 BY CM    Staphylococcus epidermidis NOT DETECTED NOT DETECTED Final   Staphylococcus lugdunensis NOT DETECTED NOT DETECTED Final   Streptococcus species NOT DETECTED NOT DETECTED Final   Streptococcus agalactiae NOT DETECTED NOT DETECTED Final   Streptococcus pneumoniae NOT DETECTED NOT DETECTED Final   Streptococcus pyogenes NOT DETECTED NOT DETECTED Final   A.calcoaceticus-baumannii NOT DETECTED NOT DETECTED Final   Bacteroides fragilis NOT DETECTED NOT DETECTED Final   Enterobacterales NOT DETECTED NOT DETECTED Final   Enterobacter cloacae complex NOT DETECTED NOT DETECTED Final   Escherichia coli NOT DETECTED NOT DETECTED  Final   Klebsiella aerogenes NOT DETECTED NOT DETECTED Final   Klebsiella oxytoca NOT DETECTED NOT DETECTED Final   Klebsiella pneumoniae NOT DETECTED NOT DETECTED Final   Proteus species NOT DETECTED NOT DETECTED Final   Salmonella species NOT DETECTED NOT DETECTED Final   Serratia marcescens NOT DETECTED NOT DETECTED Final   Haemophilus influenzae NOT DETECTED NOT DETECTED Final   Neisseria meningitidis NOT DETECTED NOT DETECTED Final   Pseudomonas aeruginosa NOT DETECTED NOT DETECTED Final   Stenotrophomonas maltophilia NOT DETECTED NOT DETECTED Final   Candida albicans NOT DETECTED NOT DETECTED Final   Candida auris NOT DETECTED NOT DETECTED Final   Candida glabrata NOT DETECTED NOT DETECTED Final   Candida krusei NOT DETECTED NOT DETECTED Final   Candida parapsilosis NOT DETECTED NOT DETECTED Final   Candida tropicalis NOT DETECTED NOT DETECTED Final   Cryptococcus neoformans/gattii NOT DETECTED NOT DETECTED Final   Meth resistant mecA/C and MREJ NOT DETECTED NOT DETECTED Final    Comment: Performed at [facility], [address], [place], [place] [address]  Resp Panel by RT-PCR (Flu A&B, Covid) Nasopharyngeal Swab     Status: None   Collection Time: 05/24/20  3:14 PM   Specimen: Nasopharyngeal Swab; Nasopharyngeal(NP) swabs in vial transport medium  Result Value Ref Range Status   SARS Coronavirus 2 by RT PCR NEGATIVE NEGATIVE Final    Comment: (NOTE) SARS-CoV-2 target nucleic acids are NOT DETECTED.  The SARS-CoV-2 RNA is generally detectable in upper respiratory specimens during the acute phase of infection. The lowest concentration of SARS-CoV-2 viral copies this assay can detect is 138 copies/mL. A negative result does not preclude SARS-Cov-2 infection and should not be used as the sole basis for treatment or other patient management decisions. A negative result may occur with  improper specimen collection/handling, submission of specimen other than nasopharyngeal  swab, presence of viral mutation(s) within the areas targeted by this assay, and inadequate number of viral copies(<138 copies/mL). A negative result must be combined with clinical observations, patient history, and epidemiological information. The expected result is Negative.  Fact Sheet for Patients:  BloggerCourse.com  Fact Sheet for Healthcare Providers:  SeriousBroker.it  This test is no t yet approved or cleared by the [place]  FDA and  has been authorized for detection and/or diagnosis of SARS-CoV-2 by FDA under an Emergency Use Authorization (EUA). This EUA will remain  in effect (meaning this test can be used) for the duration of the COVID-19 declaration under Section 564(b)(1) of the Act, 21 U.S.C.section 360bbb-3(b)(1), unless the authorization is terminated  or revoked sooner.       Influenza A by PCR NEGATIVE NEGATIVE Final   Influenza B by PCR NEGATIVE NEGATIVE Final    Comment: (NOTE) The Xpert Xpress SARS-CoV-2/FLU/RSV plus assay is intended as an aid in the diagnosis of influenza from Nasopharyngeal swab specimens and should not be used as a sole basis for treatment. Nasal washings and aspirates are unacceptable for Xpert Xpress SARS-CoV-2/FLU/RSV testing.  Fact Sheet for Patients: BloggerCourse.comhttps://www.fda.gov/media/152166/download  Fact Sheet for Healthcare Providers: SeriousBroker.ithttps://www.fda.gov/media/152162/download  This test is not yet approved or cleared by the [place] FDA and has been authorized for detection and/or diagnosis of SARS-CoV-2 by FDA under an Emergency Use Authorization (EUA). This EUA will remain in effect (meaning this test can be used) for the duration of the COVID-19 declaration under Section 564(b)(1) of the Act, 21 U.S.C. section 360bbb-3(b)(1), unless the authorization is terminated or revoked.  Performed at [facility], [address] [place], [place] [address]   Blood  Culture (routine x 2)     Status: Abnormal   Collection Time: 05/24/20  9:45 PM   Specimen: BLOOD LEFT HAND  Result Value Ref Range Status   Specimen Description BLOOD LEFT HAND  Final   Special Requests   Final    BOTTLES DRAWN AEROBIC AND ANAEROBIC Blood Culture results may not be optimal due to an inadequate volume of blood received in culture bottles   Culture  Setup Time   Final    GRAM POSITIVE COCCI IN CLUSTERS IN BOTH AEROBIC AND ANAEROBIC BOTTLES CRITICAL VALUE NOTED.  VALUE IS CONSISTENT WITH PREVIOUSLY REPORTED AND CALLED VALUE.    Culture (A)  Final    STAPHYLOCOCCUS AUREUS SUSCEPTIBILITIES PERFORMED ON PREVIOUS CULTURE WITHIN THE LAST 5 DAYS. Performed at [facility], [address] [place], [place] [address]    Report Status 05/27/2020 FINAL  Final  MRSA PCR Screening     Status: None   Collection Time: 05/24/20 11:46 PM   Specimen: Nasopharyngeal  Result Value Ref Range Status   MRSA by PCR NEGATIVE NEGATIVE Final    Comment:        The GeneXpert MRSA Assay (FDA approved for NASAL specimens only), is one component of a comprehensive MRSA colonization surveillance program. It is not intended to diagnose MRSA infection nor to guide or monitor treatment for MRSA infections. Performed at [facility], [address] [place], [place] [address]   Urine culture     Status: None   Collection Time: 05/24/20 11:59 PM   Specimen: In/Out Cath Urine  Result Value Ref Range Status   Specimen Description IN/OUT CATH URINE  Final   Special Requests NONE  Final   Culture   Final    NO GROWTH Performed at [facility], [address] [place], [place] [address]    Report Status 05/26/2020 FINAL  Final  Culture, blood (routine x 2)     Status: None   Collection Time: 05/25/20 10:19 PM   Specimen: BLOOD LEFT HAND  Result Value Ref Range Status   Specimen Description BLOOD LEFT HAND  Final   Special Requests   Final  BOTTLES DRAWN AEROBIC AND  ANAEROBIC Blood Culture adequate volume   Culture   Final    NO GROWTH 5 DAYS Performed at [facility], [address] [place], [place] [address]    Report Status 05/30/2020 FINAL  Final  Culture, blood (routine x 2)     Status: None   Collection Time: 05/25/20 10:32 PM   Specimen: BLOOD RIGHT HAND  Result Value Ref Range Status   Specimen Description BLOOD RIGHT HAND  Final   Special Requests AEROBIC BOTTLE ONLY Blood Culture adequate volume  Final   Culture   Final    NO GROWTH 5 DAYS Performed at [facility], [address] [place], [place] [address]    Report Status 05/30/2020 FINAL  Final  Expectorated Sputum Assessment w Gram Stain, Rflx to Resp Cult     Status: None   Collection Time: 05/29/20  7:11 AM   Specimen: Expectorated Sputum  Result Value Ref Range Status   Specimen Description EXPECTORATED SPUTUM  Final   Special Requests Normal  Final   Sputum evaluation   Final    THIS SPECIMEN IS ACCEPTABLE FOR SPUTUM CULTURE Performed at [facility], [address], [place], [place] [address]    Report Status 05/29/2020 FINAL  Final  Culture, Respiratory w Gram Stain     Status: None   Collection Time: 05/29/20  7:11 AM  Result Value Ref Range Status   Specimen Description EXPECTORATED SPUTUM  Final   Special Requests Normal Reflexed from [id number]  Final   Gram Stain   Final    ABUNDANT WBC PRESENT,BOTH PMN AND MONONUCLEAR FEW SQUAMOUS EPITHELIAL CELLS PRESENT ABUNDANT GRAM NEGATIVE RODS ABUNDANT GRAM POSITIVE COCCI FEW YEAST    Culture   Final    FEW Consistent with normal respiratory flora. No Pseudomonas species isolated Performed at [facility], [address] [place], [place] [address]    Report Status 06/01/2020 FINAL  Final  Culture, blood (routine x 2)     Status: None (Preliminary result)   Collection Time: 05/31/20  8:07 AM   Specimen: BLOOD LEFT HAND  Result Value Ref Range Status   Specimen Description BLOOD LEFT HAND   Final   Special Requests   Final    BOTTLES DRAWN AEROBIC AND ANAEROBIC Blood Culture adequate volume   Culture   Final    NO GROWTH < 24 HOURS Performed at [facility], [address], [place], [place] [address]    Report Status PENDING  Incomplete  Culture, blood (routine x 2)     Status: None (Preliminary result)   Collection Time: 05/31/20  8:12 AM   Specimen: BLOOD RIGHT HAND  Result Value Ref Range Status   Specimen Description BLOOD RIGHT HAND  Final   Special Requests   Final    BOTTLES DRAWN AEROBIC ONLY Blood Culture results may not be optimal due to an inadequate volume of blood received in culture bottles   Culture   Final    NO GROWTH < 24 HOURS Performed at [facility], [address] [place], [place] [address]    Report Status PENDING  Incomplete  Gram stain     Status: None   Collection Time: 05/31/20 11:52 AM   Specimen: Fluid  Result Value Ref Range Status   Specimen Description FLUID  Final   Special Requests PLEURAL FLUID LEFT  Final   Gram Stain   Final  FEW WBC PRESENT, PREDOMINANTLY PMN NO ORGANISMS SEEN Performed at [facility], [address], [place], [place] [address]    Report Status 05/31/2020 FINAL  Final  Culture, body fluid w Gram Stain-bottle     Status: None (Preliminary result)   Collection Time: 05/31/20 11:52 AM   Specimen: Fluid  Result Value Ref Range Status   Specimen Description FLUID  Final   Special Requests PLEURAL FLUID LEFT  Final   Culture   Final    NO GROWTH < 24 HOURS Performed at [facility], [address] [place], [place] [address]    Report Status PENDING  Incomplete      Studies: CT CHEST WO CONTRAST  Result Date: 05/31/2020 CLINICAL DATA:  Follow-up multi lobar bilateral cavitary pneumonia and pleural fluid. EXAM: CT CHEST WITHOUT CONTRAST TECHNIQUE: Multidetector CT imaging of the chest was performed following the standard protocol without IV contrast. COMPARISON:  Chest  radiograph obtained earlier today following left thoracentesis. Chest CT obtained yesterday. FINDINGS: Cardiovascular: No significant vascular findings. Normal heart size. No pericardial effusion. Mediastinum/Nodes: The previously demonstrated 10 mm short axis precarinal node measures 9 mm in short axis diameter today. No new enlarged lymph nodes. Unremarkable thyroid gland and esophagus. Lungs/Pleura: Interval right basilar pleural pigtail catheter with a marked decrease in right pleural fluid. Only a small amount of residual pleural fluid remains at the posterior right lung base. Small to moderate-sized left pleural effusion, significantly decreased following thoracentesis. No pneumothorax. Large number of cavitary lesions are again demonstrated throughout both lungs with decreased soft tissue and patchy components. Upper Abdomen: Unremarkable. Musculoskeletal: Mild thoracic and lower cervical spine degenerative changes. IMPRESSION: 1. Interval right basilar pleural pigtail catheter with a marked decrease in right pleural fluid. Only a small amount of residual pleural fluid remains at the posterior right lung base. 2. Small to moderate-sized left pleural effusion, significantly decreased following thoracentesis. 3. Large number of cavitary lesions throughout both lungs with decreased soft tissue and patchy components, compatible with improving septic emboli. 4. Stable mild reactive mediastinal adenopathy. Electronically Signed   By: Beckie Salts M.D.   On: 05/31/2020 23:08    Scheduled Meds: . alteplase (TPA) for intrapleural administration  10 mg Intrapleural Daily   And  . pulmozyme (DORNASE) for intrapleural administration  5 mg Intrapleural Daily  . heparin  5,000 Units Subcutaneous Q8H  . sodium chloride flush  10 mL Intracatheter Q8H    Continuous Infusions: .  ceFAZolin (ANCEF) IV 2 g (06/01/20 1316)     LOS: 8 days     Darlin Drop, MD [facility] Pager [phone]  If  7PM-7AM, please contact night-coverage [email or web address] Password [facility] 06/01/2020, 6:02 PM

## 2020-06-01 NOTE — Progress Notes (Signed)
PHARMACY CONSULT NOTE FOR:  OUTPATIENT  PARENTERAL ANTIBIOTIC THERAPY (OPAT)  Indication: Bacteremia Regimen: Cefazolin  End date: 07/06/20  IV antibiotic discharge orders are pended. To discharging provider:  please sign these orders via discharge navigator,  Select New Orders & click on the button choice - Manage This Unsigned Work.    Thank you for allowing pharmacy to be a part of this patient's care.  Sharin Mons, PharmD, BCPS, BCIDP Infectious Diseases Clinical Pharmacist Phone: [phone] 06/01/2020 2:00 PM

## 2020-06-01 NOTE — Progress Notes (Signed)
Subjective:  Patient complaining of pleuritic chest pain   Antibiotics:  Anti-infectives (From admission, onward)   Start     Dose/Rate Route Frequency Ordered Stop   05/30/20 0715  meropenem (MERREM) 1 g in sodium chloride 0.9 % 100 mL IVPB  Status:  Discontinued        1 g 200 mL/hr over 30 Minutes Intravenous Every 8 hours 05/30/20 0621 05/30/20 1356   05/30/20 0615  linezolid (ZYVOX) IVPB 600 mg  Status:  Discontinued        600 mg 300 mL/hr over 60 Minutes Intravenous Every 12 hours 05/30/20 0609 05/30/20 1357   05/25/20 2200  ceFAZolin (ANCEF) IVPB 2g/100 mL premix        2 g 200 mL/hr over 30 Minutes Intravenous Every 8 hours 05/25/20 0945     05/24/20 2200  cefTRIAXone (ROCEPHIN) 2 g in sodium chloride 0.9 % 100 mL IVPB  Status:  Discontinued        2 g 200 mL/hr over 30 Minutes Intravenous Every 12 hours 05/24/20 1934 05/25/20 0936   05/24/20 1715  ceFEPIme (MAXIPIME) 2 g in sodium chloride 0.9 % 100 mL IVPB  Status:  Discontinued        2 g 200 mL/hr over 30 Minutes Intravenous Every 24 hours 05/24/20 1713 05/24/20 1934   05/24/20 1715  vancomycin (VANCOREADY) IVPB 1250 mg/250 mL        1,250 mg 166.7 mL/hr over 90 Minutes Intravenous  Once 05/24/20 1713 05/24/20 2153   05/24/20 1713  vancomycin variable dose per unstable renal function (pharmacist dosing)  Status:  Discontinued         Does not apply See admin instructions 05/24/20 1713 05/25/20 0936   05/24/20 1645  metroNIDAZOLE (FLAGYL) IVPB 500 mg        500 mg 100 mL/hr over 60 Minutes Intravenous  Once 05/24/20 1644 05/24/20 1850      Medications: Scheduled Meds: . alteplase (TPA) for intrapleural administration  10 mg Intrapleural Daily   And  . pulmozyme (DORNASE) for intrapleural administration  5 mg Intrapleural Daily  . heparin  5,000 Units Subcutaneous Q8H  . sodium chloride flush  10 mL Intracatheter Q8H   Continuous Infusions: .  ceFAZolin (ANCEF) IV 2 g (06/01/20 1316)   PRN  Meds:.acetaminophen, albuterol, docusate sodium, oxyCODONE, polyethylene glycol    Objective: Weight change:   Intake/Output Summary (Last 24 hours) at 06/01/2020 1637 Last data filed at 05/31/2020 2000 Gross per 24 hour  Intake 10 ml  Output 750 ml  Net -740 ml   Blood pressure (!) 141/78, pulse (!) 103, temperature 99.9 F (37.7 C), temperature source Oral, resp. rate 20, height  (1.905 m), weight 70.2 kg, SpO2 100 %. Temp:  [99.9 F (37.7 C)] 99.9 F (37.7 C) (04/11 0821) Pulse Rate:  [92-106] 103 (04/11 1556) Resp:  [18-26] 20 (04/11 1556) BP: (121-141)/(65-82) 141/78 (04/11 1556) SpO2:  [94 %-100 %] 100 % (04/11 1556) Weight:  [70.2 kg] 70.2 kg (04/11 0405)  Physical Exam: Physical Exam Constitutional:      Appearance: He is cachectic.  HENT:     Head: Normocephalic and atraumatic.  Eyes:     Conjunctiva/sclera: Conjunctivae normal.  Cardiovascular:     Rate and Rhythm: Normal rate and regular rhythm.  Pulmonary:     Effort: No respiratory distress.     Breath sounds: Examination of the right-middle field reveals decreased breath sounds. Examination of the right-lower  field reveals decreased breath sounds. Decreased breath sounds present. No wheezing.  Abdominal:     General: There is no distension.     Palpations: Abdomen is soft.  Musculoskeletal:        General: Normal range of motion.     Cervical back: Normal range of motion and neck supple.  Skin:    General: Skin is warm and dry.     Findings: No erythema or rash.  Neurological:     General: No focal deficit present.     Mental Status: He is alert and oriented to person, place, and time.  Psychiatric:        Mood and Affect: Mood normal.        Behavior: Behavior normal.        Thought Content: Thought content normal.        Judgment: Judgment normal.     Left arm pathology has resolved  CBC:    BMET Recent Labs    05/31/20 0124 06/01/20 0239  NA 135 131*  K 3.7 3.7  CL 99 98  CO2  28 27  GLUCOSE 132* 123*  BUN 37* 39*  CREATININE 1.46* 1.38*  CALCIUM 7.4* 7.1*     Liver Panel  Recent Labs    05/31/20 0124  PROT 4.4*  ALBUMIN 1.4*  AST 25  ALT 26  ALKPHOS 54  BILITOT 0.7       Sedimentation Rate No results for input(s): ESRSEDRATE in the last 72 hours. C-Reactive Protein No results for input(s): CRP in the last 72 hours.  Micro Results: Recent Results (from the past 720 hour(s))  Blood Culture (routine x 2)     Status: Abnormal   Collection Time: 05/24/20  3:08 PM   Specimen: Left Antecubital; Blood  Result Value Ref Range Status   Specimen Description LEFT ANTECUBITAL  Final   Special Requests   Final    BOTTLES DRAWN AEROBIC AND ANAEROBIC Blood Culture results may not be optimal due to an inadequate volume of blood received in culture bottles   Culture  Setup Time   Final    GRAM POSITIVE COCCI IN CLUSTERS IN BOTH AEROBIC AND ANAEROBIC BOTTLES CRITICAL RESULT CALLED TO, READ BACK BY AND VERIFIED WITH: PHARMD A WOLF [id number] AT 754 AM BY CM Performed at [facility], [address] [place], [place] [address]    Culture STAPHYLOCOCCUS AUREUS (A)  Final   Report Status 05/27/2020 FINAL  Final   Organism ID, Bacteria STAPHYLOCOCCUS AUREUS  Final      Susceptibility   Staphylococcus aureus - MIC*    CIPROFLOXACIN <=0.5 SENSITIVE Sensitive     ERYTHROMYCIN >=8 RESISTANT Resistant     GENTAMICIN <=0.5 SENSITIVE Sensitive     OXACILLIN 0.5 SENSITIVE Sensitive     TETRACYCLINE <=1 SENSITIVE Sensitive     VANCOMYCIN <=0.5 SENSITIVE Sensitive     TRIMETH/SULFA <=10 SENSITIVE Sensitive     CLINDAMYCIN <=0.25 SENSITIVE Sensitive     RIFAMPIN <=0.5 SENSITIVE Sensitive     Inducible Clindamycin NEGATIVE Sensitive     * STAPHYLOCOCCUS AUREUS  Blood Culture ID Panel (Reflexed)     Status: Abnormal   Collection Time: 05/24/20  3:08 PM  Result Value Ref Range Status   Enterococcus faecalis NOT DETECTED NOT DETECTED Final   Enterococcus  Faecium NOT DETECTED NOT DETECTED Final   Listeria monocytogenes NOT DETECTED NOT DETECTED Final   Staphylococcus species DETECTED (A) NOT DETECTED Final    Comment: CRITICAL RESULT  CALLED TO, READ BACK BY AND VERIFIED WITH: PHARMD A WOLF [id number] AT 800 BY CM    Staphylococcus aureus (BCID) DETECTED (A) NOT DETECTED Final    Comment: CRITICAL RESULT CALLED TO, READ BACK BY AND VERIFIED WITH: PHARMD A WOLF [id number] AT 800 BY CM    Staphylococcus epidermidis NOT DETECTED NOT DETECTED Final   Staphylococcus lugdunensis NOT DETECTED NOT DETECTED Final   Streptococcus species NOT DETECTED NOT DETECTED Final   Streptococcus agalactiae NOT DETECTED NOT DETECTED Final   Streptococcus pneumoniae NOT DETECTED NOT DETECTED Final   Streptococcus pyogenes NOT DETECTED NOT DETECTED Final   A.calcoaceticus-baumannii NOT DETECTED NOT DETECTED Final   Bacteroides fragilis NOT DETECTED NOT DETECTED Final   Enterobacterales NOT DETECTED NOT DETECTED Final   Enterobacter cloacae complex NOT DETECTED NOT DETECTED Final   Escherichia coli NOT DETECTED NOT DETECTED Final   Klebsiella aerogenes NOT DETECTED NOT DETECTED Final   Klebsiella oxytoca NOT DETECTED NOT DETECTED Final   Klebsiella pneumoniae NOT DETECTED NOT DETECTED Final   Proteus species NOT DETECTED NOT DETECTED Final   Salmonella species NOT DETECTED NOT DETECTED Final   Serratia marcescens NOT DETECTED NOT DETECTED Final   Haemophilus influenzae NOT DETECTED NOT DETECTED Final   Neisseria meningitidis NOT DETECTED NOT DETECTED Final   Pseudomonas aeruginosa NOT DETECTED NOT DETECTED Final   Stenotrophomonas maltophilia NOT DETECTED NOT DETECTED Final   Candida albicans NOT DETECTED NOT DETECTED Final   Candida auris NOT DETECTED NOT DETECTED Final   Candida glabrata NOT DETECTED NOT DETECTED Final   Candida krusei NOT DETECTED NOT DETECTED Final   Candida parapsilosis NOT DETECTED NOT DETECTED Final   Candida tropicalis NOT DETECTED NOT  DETECTED Final   Cryptococcus neoformans/gattii NOT DETECTED NOT DETECTED Final   Meth resistant mecA/C and MREJ NOT DETECTED NOT DETECTED Final    Comment: Performed at [facility], [address], [place], [place] [address]  Resp Panel by RT-PCR (Flu A&B, Covid) Nasopharyngeal Swab     Status: None   Collection Time: 05/24/20  3:14 PM   Specimen: Nasopharyngeal Swab; Nasopharyngeal(NP) swabs in vial transport medium  Result Value Ref Range Status   SARS Coronavirus 2 by RT PCR NEGATIVE NEGATIVE Final    Comment: (NOTE) SARS-CoV-2 target nucleic acids are NOT DETECTED.  The SARS-CoV-2 RNA is generally detectable in upper respiratory specimens during the acute phase of infection. The lowest concentration of SARS-CoV-2 viral copies this assay can detect is 138 copies/mL. A negative result does not preclude SARS-Cov-2 infection and should not be used as the sole basis for treatment or other patient management decisions. A negative result may occur with  improper specimen collection/handling, submission of specimen other than nasopharyngeal swab, presence of viral mutation(s) within the areas targeted by this assay, and inadequate number of viral copies(<138 copies/mL). A negative result must be combined with clinical observations, patient history, and epidemiological information. The expected result is Negative.  Fact Sheet for Patients:  BloggerCourse.com  Fact Sheet for Healthcare Providers:  SeriousBroker.it  This test is no t yet approved or cleared by the [place] FDA and  has been authorized for detection and/or diagnosis of SARS-CoV-2 by FDA under an Emergency Use Authorization (EUA). This EUA will remain  in effect (meaning this test can be used) for the duration of the COVID-19 declaration under Section 564(b)(1) of the Act, 21 U.S.C.section 360bbb-3(b)(1), unless the authorization is terminated  or revoked  sooner.       Influenza A  by PCR NEGATIVE NEGATIVE Final   Influenza B by PCR NEGATIVE NEGATIVE Final    Comment: (NOTE) The Xpert Xpress SARS-CoV-2/FLU/RSV plus assay is intended as an aid in the diagnosis of influenza from Nasopharyngeal swab specimens and should not be used as a sole basis for treatment. Nasal washings and aspirates are unacceptable for Xpert Xpress SARS-CoV-2/FLU/RSV testing.  Fact Sheet for Patients: BloggerCourse.com  Fact Sheet for Healthcare Providers: SeriousBroker.it  This test is not yet approved or cleared by the [place] FDA and has been authorized for detection and/or diagnosis of SARS-CoV-2 by FDA under an Emergency Use Authorization (EUA). This EUA will remain in effect (meaning this test can be used) for the duration of the COVID-19 declaration under Section 564(b)(1) of the Act, 21 U.S.C. section 360bbb-3(b)(1), unless the authorization is terminated or revoked.  Performed at [facility], [address] [place], [place] [address]   Blood Culture (routine x 2)     Status: Abnormal   Collection Time: 05/24/20  9:45 PM   Specimen: BLOOD LEFT HAND  Result Value Ref Range Status   Specimen Description BLOOD LEFT HAND  Final   Special Requests   Final    BOTTLES DRAWN AEROBIC AND ANAEROBIC Blood Culture results may not be optimal due to an inadequate volume of blood received in culture bottles   Culture  Setup Time   Final    GRAM POSITIVE COCCI IN CLUSTERS IN BOTH AEROBIC AND ANAEROBIC BOTTLES CRITICAL VALUE NOTED.  VALUE IS CONSISTENT WITH PREVIOUSLY REPORTED AND CALLED VALUE.    Culture (A)  Final    STAPHYLOCOCCUS AUREUS SUSCEPTIBILITIES PERFORMED ON PREVIOUS CULTURE WITHIN THE LAST 5 DAYS. Performed at [facility], [address] [place], [place] [address]    Report Status 05/27/2020 FINAL  Final  MRSA PCR Screening     Status: None   Collection Time: 05/24/20 11:46  PM   Specimen: Nasopharyngeal  Result Value Ref Range Status   MRSA by PCR NEGATIVE NEGATIVE Final    Comment:        The GeneXpert MRSA Assay (FDA approved for NASAL specimens only), is one component of a comprehensive MRSA colonization surveillance program. It is not intended to diagnose MRSA infection nor to guide or monitor treatment for MRSA infections. Performed at [facility], [address], [place], [place] [address]   Urine culture     Status: None   Collection Time: 05/24/20 11:59 PM   Specimen: In/Out Cath Urine  Result Value Ref Range Status   Specimen Description IN/OUT CATH URINE  Final   Special Requests NONE  Final   Culture   Final    NO GROWTH Performed at [facility], [address], [place], [place] [address]    Report Status 05/26/2020 FINAL  Final  Culture, blood (routine x 2)     Status: None   Collection Time: 05/25/20 10:19 PM   Specimen: BLOOD LEFT HAND  Result Value Ref Range Status   Specimen Description BLOOD LEFT HAND  Final   Special Requests   Final    BOTTLES DRAWN AEROBIC AND ANAEROBIC Blood Culture adequate volume   Culture   Final    NO GROWTH 5 DAYS Performed at [facility], [address] [place], [place] [address]    Report Status 05/30/2020 FINAL  Final  Culture, blood (routine x 2)     Status: None   Collection Time: 05/25/20 10:32 PM  Specimen: BLOOD RIGHT HAND  Result Value Ref Range Status   Specimen Description BLOOD RIGHT HAND  Final   Special Requests AEROBIC BOTTLE ONLY Blood Culture adequate volume  Final   Culture   Final    NO GROWTH 5 DAYS Performed at [facility], [address] [place], [place] [address]    Report Status 05/30/2020 FINAL  Final  Expectorated Sputum Assessment w Gram Stain, Rflx to Resp Cult     Status: None   Collection Time: 05/29/20  7:11 AM   Specimen: Expectorated Sputum  Result Value Ref Range Status   Specimen Description EXPECTORATED SPUTUM  Final    Special Requests Normal  Final   Sputum evaluation   Final    THIS SPECIMEN IS ACCEPTABLE FOR SPUTUM CULTURE Performed at [facility], [address], [place], [place] [address]    Report Status 05/29/2020 FINAL  Final  Culture, Respiratory w Gram Stain     Status: None   Collection Time: 05/29/20  7:11 AM  Result Value Ref Range Status   Specimen Description EXPECTORATED SPUTUM  Final   Special Requests Normal Reflexed from [id number]  Final   Gram Stain   Final    ABUNDANT WBC PRESENT,BOTH PMN AND MONONUCLEAR FEW SQUAMOUS EPITHELIAL CELLS PRESENT ABUNDANT GRAM NEGATIVE RODS ABUNDANT GRAM POSITIVE COCCI FEW YEAST    Culture   Final    FEW Consistent with normal respiratory flora. No Pseudomonas species isolated Performed at [facility], [address], [place], [place] [address]    Report Status 06/01/2020 FINAL  Final  Culture, blood (routine x 2)     Status: None (Preliminary result)   Collection Time: 05/31/20  8:07 AM   Specimen: BLOOD LEFT HAND  Result Value Ref Range Status   Specimen Description BLOOD LEFT HAND  Final   Special Requests   Final    BOTTLES DRAWN AEROBIC AND ANAEROBIC Blood Culture adequate volume   Culture   Final    NO GROWTH < 24 HOURS Performed at [facility], [address], [place], [place] [address]    Report Status PENDING  Incomplete  Culture, blood (routine x 2)     Status: None (Preliminary result)   Collection Time: 05/31/20  8:12 AM   Specimen: BLOOD RIGHT HAND  Result Value Ref Range Status   Specimen Description BLOOD RIGHT HAND  Final   Special Requests   Final    BOTTLES DRAWN AEROBIC ONLY Blood Culture results may not be optimal due to an inadequate volume of blood received in culture bottles   Culture   Final    NO GROWTH < 24 HOURS Performed at [facility], [address] [place], [place] [address]    Report Status PENDING  Incomplete  Gram stain     Status: None   Collection Time: 05/31/20 11:52 AM    Specimen: Fluid  Result Value Ref Range Status   Specimen Description FLUID  Final   Special Requests PLEURAL FLUID LEFT  Final   Gram Stain   Final    FEW WBC PRESENT, PREDOMINANTLY PMN NO ORGANISMS SEEN Performed at [facility], [address] [place], [place] [address]    Report Status 05/31/2020 FINAL  Final  Culture, body fluid w Gram Stain-bottle     Status: None (Preliminary result)   Collection Time: 05/31/20 11:52 AM   Specimen: Fluid  Result Value Ref Range Status   Specimen  Description FLUID  Final   Special Requests PLEURAL FLUID LEFT  Final   Culture   Final    NO GROWTH < 24 HOURS Performed at [facility], [address] [place], [place] [address]    Report Status PENDING  Incomplete    Studies/Results: DG Chest 1 View  Result Date: 05/31/2020 CLINICAL DATA:  50 year old male status post left thoracentesis. Follow-up study. EXAM: CHEST  1 VIEW COMPARISON:  Chest x-ray 05/31/2020. FINDINGS: Previously noted left pleural effusion has decreased and is now very small. No appreciable left-sided pneumothorax. Trace right pleural effusion. Small bore pigtail drainage catheter in the inferior aspect of the right hemithorax. Several areas of airspace consolidation and cavitation are again noted in the lungs bilaterally, similar to the prior study. No pneumothorax. No evidence of pulmonary edema. Heart size is normal. Upper mediastinal contours are within normal limits. IMPRESSION: 1. Decreased now small left pleural effusion following left-sided thoracentesis. No pneumothorax. Multilobar bilateral cavitary pneumonia, similar to the prior examination. Electronically Signed   By: Trudie Reed M.D.   On: 05/31/2020 12:24   CT CHEST WO CONTRAST  Result Date: 05/31/2020 CLINICAL DATA:  Follow-up multi lobar bilateral cavitary pneumonia and pleural fluid. EXAM: CT CHEST WITHOUT CONTRAST TECHNIQUE: Multidetector CT imaging of the chest was performed following the  standard protocol without IV contrast. COMPARISON:  Chest radiograph obtained earlier today following left thoracentesis. Chest CT obtained yesterday. FINDINGS: Cardiovascular: No significant vascular findings. Normal heart size. No pericardial effusion. Mediastinum/Nodes: The previously demonstrated 10 mm short axis precarinal node measures 9 mm in short axis diameter today. No new enlarged lymph nodes. Unremarkable thyroid gland and esophagus. Lungs/Pleura: Interval right basilar pleural pigtail catheter with a marked decrease in right pleural fluid. Only a small amount of residual pleural fluid remains at the posterior right lung base. Small to moderate-sized left pleural effusion, significantly decreased following thoracentesis. No pneumothorax. Large number of cavitary lesions are again demonstrated throughout both lungs with decreased soft tissue and patchy components. Upper Abdomen: Unremarkable. Musculoskeletal: Mild thoracic and lower cervical spine degenerative changes. IMPRESSION: 1. Interval right basilar pleural pigtail catheter with a marked decrease in right pleural fluid. Only a small amount of residual pleural fluid remains at the posterior right lung base. 2. Small to moderate-sized left pleural effusion, significantly decreased following thoracentesis. 3. Large number of cavitary lesions throughout both lungs with decreased soft tissue and patchy components, compatible with improving septic emboli. 4. Stable mild reactive mediastinal adenopathy. Electronically Signed   By: Beckie Salts M.D.   On: 05/31/2020 23:08   DG CHEST PORT 1 VIEW  Result Date: 05/31/2020 CLINICAL DATA:  Chest tube in place EXAM: PORTABLE CHEST 1 VIEW COMPARISON:  May 30, 2020 chest radiograph and chest CT FINDINGS: Pigtail catheter noted at right base, unchanged. No pneumothorax. Multiple opacities throughout the lungs, some of which are cavitated, persist without change. There is consolidation in the left base with  small left pleural effusion. Partial clearing of opacity from right base compared to 1 day prior. Heart is mildly enlarged with pulmonary vascularity within normal limits. No adenopathy. No bone lesions. IMPRESSION: Pigtail catheter at right base. No pneumothorax. Multiple cavitary lesions, likely septic emboli, persist. Consolidation left lower lung region with small left pleural effusion. There has been partial clearing of opacity from the right base compared to 1 day prior. Stable cardiac silhouette. Electronically Signed   By: Bretta Bang III M.D.   On: 05/31/2020 08:07   US THORACENTESIS  ASP PLEURAL SPACE W/IMG GUIDE  Result Date: 05/31/2020 INDICATION: Patient with history of sepsis, MSSA bacteremia, acute hypoxic respiratory failure, bilateral pneumonia, and bilateral pleural effusions s/p right chest tube placement in IR 05/30/2020. Request is made for diagnostic and therapeutic left thoracentesis. EXAM: ULTRASOUND GUIDED DIAGNOSTIC AND THERAPEUTIC LEFT THORACENTESIS MEDICATIONS: 10 mL 1% lidocaine COMPLICATIONS: None immediate. PROCEDURE: An ultrasound guided thoracentesis was thoroughly discussed with the patient and questions answered. The benefits, risks, alternatives and complications were also discussed. The patient understands and wishes to proceed with the procedure. Written consent was obtained. Ultrasound was performed to localize and mark an adequate pocket of fluid in the left chest. The area was then prepped and draped in the normal sterile fashion. 1% Lidocaine was used for local anesthesia. Under ultrasound guidance a 6 Fr Safe-T-Centesis catheter was introduced. Thoracentesis was performed. The catheter was removed and a dressing applied. FINDINGS: A total of approximately 550 mL of dark red fluid was removed. Samples were sent to the laboratory as requested by the clinical team. IMPRESSION: Successful ultrasound guided left thoracentesis yielding 550 mL of pleural fluid. 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  While his procalcitonin has trended down his white cells however now at 45,000 and he is spiking temperatures to nearly 103 degrees.  I suspect the critical issue is management of the pleural space and wonder if he may not need a VATS  I would want to give him 6 weeks of IV antibiotics given findings in the lungs to suggest embolization of vegetations from right sided endocarditis  His fevers have improved and he looks much better vs yesterday  We will continue to follow.     LOS: 8 days   Acey Lav 06/01/2020, 4:37 PM

## 2020-06-01 NOTE — Progress Notes (Signed)
NAME:  Edward Mueller, MRN:  [id number], DOB:  [birth year], LOS: 8 ADMISSION DATE:  05/24/2020, CONSULTATION DATE:  05/24/2020 REFERRING MD:  Lockie Mola, CHIEF COMPLAINT:  Dyspnea   History of Present Illness:  50 year old man who presented to [facility] on 4/3 with c/o dyspnea.    Reportedly was in his usual state of health until 48 hours prior to admit. 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  On 4/1 he donated plasma.  After that he woke with a "knot on his arm" and felt poorly.  He had malaise, N/V and SOB with associated central chest pain.  He began coughing up bloody sputum.  He was found to have sepsis in the setting of left arm cellulitis, extensive PNA, MSSA bacteremia & AKI.  TEE negative for endocarditis.   Now s/p R chest tube and L thoracentesis performed by IR.   Pertinent  Medical History  Tobacco / THC use  Significant Hospital Events: Including procedures, antibiotic start and stop dates in addition to other pertinent events    4/3 Admit with dyspnea, febrile with elevated lactate, imaging consistent with PNA, L arm cellulitis. Negative HIV, COVID, Flu. Cultures positive for MSSA.   4/4 To [facility]   4/6 TEE negative for vegetation   4/7 PCCM called back for increased O2 needs. O2 weaned to 3L on exam, sats 97%. VBG 7.4 / 42s  4/9 chest CT revealed bilateral pleural effusions with appearance of loculation, IR consulted and placed right pigtail chest tube patient received first dose of TPA/dornase  4/10  2L removed via chest tube since insertion.  Second dose TPA dornase administered  4/10 L thoracentesis performed by IR yielding 550 mL dark red fluid  4/11 another chest tube drainage    Interim History / Subjective:  Feels well this am. TMax 100.6 at 1600 yesterday. No fever overnight. Breathing comfortably on 2L [place]. No pain at chest tube site except with coughing. Chest tube with ~938mL drainage.   Objective    Blood pressure 126/76, pulse 92, temperature 99.9 F (37.7 C), temperature source Oral, resp. rate 18, height 6\' 3"  (1.905 m), weight 70.2 kg, SpO2 97 %.        Intake/Output Summary (Last 24 hours) at 06/01/2020 1117 Last data filed at 05/31/2020 2000 Gross per 24 hour  Intake 10 ml  Output 750 ml  Net -740 ml   Filed Weights   05/24/20 1410 05/29/20 0500 06/01/20 0405  Weight: 68 kg 68.9 kg 70.2 kg    Examination: Physical Exam Constitutional:      Comments: Lying in bed, resting comfortably. No acute distress.   Cardiovascular:     Rate and Rhythm: Regular rhythm. Tachycardia present.  Pulmonary:     Comments: Breathing comfortably on 2L [place]. No rhonchi. Slightly diminished breath sounds in bilateral lower lung fields.  Abdominal:     General: Bowel sounds are normal.     Palpations: Abdomen is soft.     Tenderness: There is no abdominal tenderness.  Musculoskeletal:        General: Normal range of motion.     Right lower leg: No edema.     Left lower leg: No edema.  Skin:    General: Skin is warm and dry.      Labs/imaging that I havepersonally reviewed  (right click and "Reselect all SmartList Selections" daily)  Repeat CT Chest with small effusion of  R Lung base, pigtail catheter in place. Small to moderate L pleural effusion, significantly decreased following thoracentesis. Large number of cavitary lesions throughout both lungs with decreased soft tissue and patchy components, compatible with improving septic emboli.   Pleural fluid culture with no growth <24 hours. Gram stain with no organisms, few WBCs.   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 So far he has had a good radiographic response to lytics.  -follow cultures from left sided fluid; unfortunately fluid from right was no sent for culture  -Cont' Ancef per ID recs -Follow fever curve, last fever (110.6) 1600 on 4/10   MSSA bacteremia Sepsis- resolved Per primary. - IV abx as above   Best practice (right click and "Reselect all SmartList Selections" daily)  Diet:  Oral Pain/Anxiety/Delirium protocol (if indicated): No VAP protocol (if indicated): Not indicated DVT prophylaxis: LMWH GI prophylaxis: N/A Glucose control:  SSI No Central venous access:  N/A Arterial line:  N/A Foley:  N/A Mobility:  OOB  PT consulted: N/A Last date of multidisciplinary goals of care discussion [n/a] Code Status:  full code Disposition: PCU  Labs   CBC: Recent Labs  Lab 05/28/20 0255 05/29/20 0308 05/30/20 0209 05/31/20 0124 06/01/20 0239  WBC 16.4* 25.0* 34.8* 45.1* 44.2*  NEUTROABS 12.9*  --   --  37.6* 38.3*  HGB 12.5* 11.9* 12.5* 12.6* 11.6*  HCT 35.8* 33.8* 36.5* 36.0* 33.9*  MCV 92.5 92.6 94.6 94.2 93.1  PLT PLATELET CLUMPS NOTED ON SMEAR, UNABLE TO ESTIMATE 140* 194 334 422*    Basic Metabolic Panel: Recent Labs  Lab 05/26/20 0304 05/27/20 0244 05/28/20 0255 05/29/20 0308 05/30/20 0209 05/31/20 0124 06/01/20 0239  NA 133* 133* 135 134* 136 135 131*  K 3.7 3.5 3.7 3.4* 3.5 3.7 3.7  CL 100 101 102 101 102 99 98  CO2 23 25 21* 26 28 28 27   GLUCOSE 107* 116* 111* 122* 111* 132* 123*  BUN 42* 46* 48* 44* 38* 37* 39*  CREATININE 2.53* 1.90* 1.50* 1.51* 1.41* 1.46* 1.38*  CALCIUM 7.5* 7.6* 7.4* 7.4* 7.4* 7.4* 7.1*  MG 2.1 2.8* 3.0* 2.8* 2.7*  --   --   PHOS 3.5  2.4* 2.5 2.5 2.4*  --   --    GFR: Estimated Creatinine Clearance: 63.6 mL/min (A) (by C-G formula based on SCr of 1.38 mg/dL (H)). Recent Labs  Lab 05/28/20 0255 05/28/20 0631 05/29/20 0308 05/30/20 0209 05/31/20 0124 06/01/20 0239  PROCALCITON  --  31.08 20.43  --  7.37  --   WBC 16.4*  --  25.0* 34.8* 45.1* 44.2*  LATICACIDVEN 1.0  --   --   --   --   --     Liver Function Tests: Recent Labs  Lab 05/31/20 0124  AST 25  ALT 26  ALKPHOS 54  BILITOT 0.7  PROT 4.4*  ALBUMIN 1.4*  No results for input(s): LIPASE, AMYLASE in the last 168 hours. No results for input(s): AMMONIA in the last 168 hours.  ABG    Component Value Date/Time   HCO3 26.6 05/28/2020 1117   TCO2 26 05/24/2020 1512   O2SAT 64.0 05/28/2020 1117     Coagulation Profile: No results for input(s): INR, PROTIME in the last 168 hours.  Cardiac Enzymes: No results for input(s): CKTOTAL, CKMB, CKMBINDEX, TROPONINI in the last 168 hours.  HbA1C: No results found for: HGBA1C  CBG: No results for input(s): GLUCAP in the last 168 hours.  Review of Systems:   Review of Systems  Constitutional: Positive for fever. Negative for chills, diaphoresis and malaise/fatigue.  Respiratory: Positive for cough. Negative for shortness of breath and wheezing.   Cardiovascular: Negative for chest pain.  Neurological: Negative for weakness.  All other systems reviewed and are negative.    Past Medical History:  He,  has no past medical history on file.   Surgical History:   Past Surgical History:  Procedure Laterality Date  . TEE WITHOUT CARDIOVERSION N/A 05/27/2020   Procedure: TRANSESOPHAGEAL ECHOCARDIOGRAM (TEE);  Surgeon: Thurmon Fair, MD;  Location: [facility] ENDOSCOPY;  Service: Cardiovascular;  Laterality: N/A;     Social History:   reports that he has been smoking. He has smoked for the past 20.00 years. He has never used smokeless tobacco. He reports previous alcohol use. He reports that he does not  use drugs.   Family History:  His family history is not on file.   Allergies No Known Allergies   Home Medications  Prior to Admission medications   Not on File     Critical care time: 35 min

## 2020-06-02 ENCOUNTER — Inpatient Hospital Stay (HOSPITAL_COMMUNITY): Payer: Medicaid Other

## 2020-06-02 DIAGNOSIS — Z4682 Encounter for fitting and adjustment of non-vascular catheter: Secondary | ICD-10-CM

## 2020-06-02 LAB — CBC WITH DIFFERENTIAL/PLATELET
Abs Immature Granulocytes: 1.29 10*3/uL — ABNORMAL HIGH (ref 0.00–0.07)
Basophils Absolute: 0.1 10*3/uL (ref 0.0–0.1)
Basophils Relative: 0 %
Eosinophils Absolute: 0 10*3/uL (ref 0.0–0.5)
Eosinophils Relative: 0 %
HCT: 28.6 % — ABNORMAL LOW (ref 39.0–52.0)
Hemoglobin: 10.1 g/dL — ABNORMAL LOW (ref 13.0–17.0)
Immature Granulocytes: 4 %
Lymphocytes Relative: 3 %
Lymphs Abs: 0.8 10*3/uL (ref 0.7–4.0)
MCH: 33.1 pg (ref 26.0–34.0)
MCHC: 35.3 g/dL (ref 30.0–36.0)
MCV: 93.8 fL (ref 80.0–100.0)
Monocytes Absolute: 1.6 10*3/uL — ABNORMAL HIGH (ref 0.1–1.0)
Monocytes Relative: 5 %
Neutro Abs: 27.7 10*3/uL — ABNORMAL HIGH (ref 1.7–7.7)
Neutrophils Relative %: 88 %
Platelets: 514 10*3/uL — ABNORMAL HIGH (ref 150–400)
RBC: 3.05 MIL/uL — ABNORMAL LOW (ref 4.22–5.81)
RDW: 13.3 % (ref 11.5–15.5)
WBC: 31.5 10*3/uL — ABNORMAL HIGH (ref 4.0–10.5)
nRBC: 0 % (ref 0.0–0.2)

## 2020-06-02 LAB — PHOSPHORUS: Phosphorus: 2.9 mg/dL (ref 2.5–4.6)

## 2020-06-02 LAB — BASIC METABOLIC PANEL
Anion gap: 8 (ref 5–15)
BUN: 29 mg/dL — ABNORMAL HIGH (ref 6–20)
CO2: 27 mmol/L (ref 22–32)
Calcium: 7.1 mg/dL — ABNORMAL LOW (ref 8.9–10.3)
Chloride: 94 mmol/L — ABNORMAL LOW (ref 98–111)
Creatinine, Ser: 1.16 mg/dL (ref 0.61–1.24)
GFR, Estimated: >60 mL/min (ref >60)
Glucose, Bld: 104 mg/dL — ABNORMAL HIGH (ref 70–99)
Potassium: 3.7 mmol/L (ref 3.5–5.1)
Sodium: 129 mmol/L — ABNORMAL LOW (ref 135–145)

## 2020-06-02 LAB — PROCALCITONIN: Procalcitonin: 2.85 ng/mL

## 2020-06-02 LAB — D-DIMER, QUANTITATIVE: D-Dimer, Quant: 2.83 ug/mL-FEU — ABNORMAL HIGH (ref 0.00–0.50)

## 2020-06-02 LAB — MAGNESIUM: Magnesium: 2.1 mg/dL (ref 1.7–2.4)

## 2020-06-02 MED ORDER — CEFAZOLIN IV (FOR PTA / DISCHARGE USE ONLY): 2.0000 g | Freq: Three times a day (TID) | INTRAVENOUS | 0 refills | Status: AC

## 2020-06-02 NOTE — Progress Notes (Signed)
Subjective:  "I feel better"   Antibiotics:  Anti-infectives (From admission, onward)   Start     Dose/Rate Route Frequency Ordered Stop   06/02/20 0000  ceFAZolin (ANCEF) IVPB        2 g Intravenous Every 8 hours 06/02/20 0544 07/07/20 2359   05/30/20 0715  meropenem (MERREM) 1 g in sodium chloride 0.9 % 100 mL IVPB  Status:  Discontinued        1 g 200 mL/hr over 30 Minutes Intravenous Every 8 hours 05/30/20 0621 05/30/20 1356   05/30/20 0615  linezolid (ZYVOX) IVPB 600 mg  Status:  Discontinued        600 mg 300 mL/hr over 60 Minutes Intravenous Every 12 hours 05/30/20 0609 05/30/20 1357   05/25/20 2200  ceFAZolin (ANCEF) IVPB 2g/100 mL premix        2 g 200 mL/hr over 30 Minutes Intravenous Every 8 hours 05/25/20 0945     05/24/20 2200  cefTRIAXone (ROCEPHIN) 2 g in sodium chloride 0.9 % 100 mL IVPB  Status:  Discontinued        2 g 200 mL/hr over 30 Minutes Intravenous Every 12 hours 05/24/20 1934 05/25/20 0936   05/24/20 1715  ceFEPIme (MAXIPIME) 2 g in sodium chloride 0.9 % 100 mL IVPB  Status:  Discontinued        2 g 200 mL/hr over 30 Minutes Intravenous Every 24 hours 05/24/20 1713 05/24/20 1934   05/24/20 1715  vancomycin (VANCOREADY) IVPB 1250 mg/250 mL        1,250 mg 166.7 mL/hr over 90 Minutes Intravenous  Once 05/24/20 1713 05/24/20 2153   05/24/20 1713  vancomycin variable dose per unstable renal function (pharmacist dosing)  Status:  Discontinued         Does not apply See admin instructions 05/24/20 1713 05/25/20 0936   05/24/20 1645  metroNIDAZOLE (FLAGYL) IVPB 500 mg        500 mg 100 mL/hr over 60 Minutes Intravenous  Once 05/24/20 1644 05/24/20 1850      Medications: Scheduled Meds: . heparin  5,000 Units Subcutaneous Q8H  . sodium chloride flush  10 mL Intracatheter Q8H   Continuous Infusions: .  ceFAZolin (ANCEF) IV 2 g (06/02/20 1436)   PRN Meds:.acetaminophen, albuterol, docusate sodium, oxyCODONE, polyethylene  glycol    Objective: Weight change:   Intake/Output Summary (Last 24 hours) at 06/02/2020 2035 Last data filed at 06/02/2020 2018 Gross per 24 hour  Intake --  Output 825 ml  Net -825 ml   Blood pressure 140/85, pulse 96, temperature 99.8 F (37.7 C), temperature source Oral, resp. rate 19, height  (1.905 m), weight 70.2 kg, SpO2 100 %. Temp:  [99.2 F (37.3 C)-100.9 F (38.3 C)] 99.8 F (37.7 C) (04/12 1430) Pulse Rate:  [91-96] 96 (04/12 1430) Resp:  [16-20] 19 (04/12 1430) BP: (139-148)/(75-85) 140/85 (04/12 1430) SpO2:  [94 %-100 %] 100 % (04/12 1030)  Physical Exam: Physical Exam Constitutional:      Appearance: He is cachectic.  HENT:     Head: Normocephalic and atraumatic.  Eyes:     Conjunctiva/sclera: Conjunctivae normal.  Cardiovascular:     Rate and Rhythm: Normal rate and regular rhythm.  Pulmonary:     Effort: No respiratory distress.     Breath sounds: Examination of the right-middle field reveals decreased breath sounds. Examination of the right-lower field reveals decreased breath sounds. Decreased breath sounds present. No wheezing.  Abdominal:  General: There is no distension.     Palpations: Abdomen is soft.  Musculoskeletal:        General: Normal range of motion.     Cervical back: Normal range of motion and neck supple.  Skin:    General: Skin is warm and dry.     Findings: No erythema or rash.  Neurological:     General: No focal deficit present.     Mental Status: He is alert and oriented to person, place, and time.  Psychiatric:        Mood and Affect: Mood normal.        Behavior: Behavior normal.        Thought Content: Thought content normal.        Judgment: Judgment normal.      CBC:    BMET Recent Labs    06/01/20 0239 06/02/20 0422  NA 131* 129*  K 3.7 3.7  CL 98 94*  CO2 27 27  GLUCOSE 123* 104*  BUN 39* 29*  CREATININE 1.38* 1.16  CALCIUM 7.1* 7.1*     Liver Panel  Recent Labs    05/31/20 0124   PROT 4.4*  ALBUMIN 1.4*  AST 25  ALT 26  ALKPHOS 54  BILITOT 0.7       Sedimentation Rate No results for input(s): ESRSEDRATE in the last 72 hours. C-Reactive Protein No results for input(s): CRP in the last 72 hours.  Micro Results: Recent Results (from the past 720 hour(s))  Blood Culture (routine x 2)     Status: Abnormal   Collection Time: 05/24/20  3:08 PM   Specimen: Left Antecubital; Blood  Result Value Ref Range Status   Specimen Description LEFT ANTECUBITAL  Final   Special Requests   Final    BOTTLES DRAWN AEROBIC AND ANAEROBIC Blood Culture results may not be optimal due to an inadequate volume of blood received in culture bottles   Culture  Setup Time   Final    GRAM POSITIVE COCCI IN CLUSTERS IN BOTH AEROBIC AND ANAEROBIC BOTTLES CRITICAL RESULT CALLED TO, READ BACK BY AND VERIFIED WITH: PHARMD A WOLF [id number] AT 754 AM BY CM Performed at [facility] Lab, [address] [place], [place] [address]    Culture STAPHYLOCOCCUS AUREUS (A)  Final   Report Status 05/27/2020 FINAL  Final   Organism ID, Bacteria STAPHYLOCOCCUS AUREUS  Final      Susceptibility   Staphylococcus aureus - MIC*    CIPROFLOXACIN <=0.5 SENSITIVE Sensitive     ERYTHROMYCIN >=8 RESISTANT Resistant     GENTAMICIN <=0.5 SENSITIVE Sensitive     OXACILLIN 0.5 SENSITIVE Sensitive     TETRACYCLINE <=1 SENSITIVE Sensitive     VANCOMYCIN <=0.5 SENSITIVE Sensitive     TRIMETH/SULFA <=10 SENSITIVE Sensitive     CLINDAMYCIN <=0.25 SENSITIVE Sensitive     RIFAMPIN <=0.5 SENSITIVE Sensitive     Inducible Clindamycin NEGATIVE Sensitive     * STAPHYLOCOCCUS AUREUS  Blood Culture ID Panel (Reflexed)     Status: Abnormal   Collection Time: 05/24/20  3:08 PM  Result Value Ref Range Status   Enterococcus faecalis NOT DETECTED NOT DETECTED Final   Enterococcus Faecium NOT DETECTED NOT DETECTED Final   Listeria monocytogenes NOT DETECTED NOT DETECTED Final   Staphylococcus species DETECTED (A) NOT  DETECTED Final    Comment: CRITICAL RESULT CALLED TO, READ BACK BY AND VERIFIED WITH: PHARMD A WOLF [id number] AT 800 BY CM    Staphylococcus aureus (BCID)  DETECTED (A) NOT DETECTED Final    Comment: CRITICAL RESULT CALLED TO, READ BACK BY AND VERIFIED [id number] A WOLF [id number] AT 800 BY CM    Staphylococcus epidermidis NOT DETECTED NOT DETECTED Final   Staphylococcus lugdunensis NOT DETECTED NOT DETECTED Final   Streptococcus species NOT DETECTED NOT DETECTED Final   Streptococcus agalactiae NOT DETECTED NOT DETECTED Final   Streptococcus pneumoniae NOT DETECTED NOT DETECTED Final   Streptococcus pyogenes NOT DETECTED NOT DETECTED Final   A.calcoaceticus-baumannii NOT DETECTED NOT DETECTED Final   Bacteroides fragilis NOT DETECTED NOT DETECTED Final   Enterobacterales NOT DETECTED NOT DETECTED Final   Enterobacter cloacae complex NOT DETECTED NOT DETECTED Final   Escherichia coli NOT DETECTED NOT DETECTED Final   Klebsiella aerogenes NOT DETECTED NOT DETECTED Final   Klebsiella oxytoca NOT DETECTED NOT DETECTED Final   Klebsiella pneumoniae NOT DETECTED NOT DETECTED Final   Proteus species NOT DETECTED NOT DETECTED Final   Salmonella species NOT DETECTED NOT DETECTED Final   Serratia marcescens NOT DETECTED NOT DETECTED Final   Haemophilus influenzae NOT DETECTED NOT DETECTED Final   Neisseria meningitidis NOT DETECTED NOT DETECTED Final   Pseudomonas aeruginosa NOT DETECTED NOT DETECTED Final   Stenotrophomonas maltophilia NOT DETECTED NOT DETECTED Final   Candida albicans NOT DETECTED NOT DETECTED Final   Candida auris NOT DETECTED NOT DETECTED Final   Candida glabrata NOT DETECTED NOT DETECTED Final   Candida krusei NOT DETECTED NOT DETECTED Final   Candida parapsilosis NOT DETECTED NOT DETECTED Final   Candida tropicalis NOT DETECTED NOT DETECTED Final   Cryptococcus neoformans/gattii NOT DETECTED NOT DETECTED Final   Meth resistant mecA/C and MREJ NOT DETECTED NOT DETECTED  Final    Comment: Performed at [facility] Lab, [address] [place], [place] [address]  Resp Panel by RT-PCR (Flu A&B, Covid) Nasopharyngeal Swab     Status: None   Collection Time: 05/24/20  3:14 PM   Specimen: Nasopharyngeal Swab; Nasopharyngeal(NP) swabs in vial transport medium  Result Value Ref Range Status   SARS Coronavirus 2 by RT PCR NEGATIVE NEGATIVE Final    Comment: (NOTE) SARS-CoV-2 target nucleic acids are NOT DETECTED.  The SARS-CoV-2 RNA is generally detectable in upper respiratory specimens during the acute phase of infection. The lowest concentration of SARS-CoV-2 viral copies this assay can detect is 138 copies/mL. A negative result does not preclude SARS-Cov-2 infection and should not be used as the sole basis for treatment or other patient management decisions. A negative result may occur with  improper specimen collection/handling, submission of specimen other than nasopharyngeal swab, presence of viral mutation(s) within the areas targeted by this assay, and inadequate number of viral copies(<138 copies/mL). A negative result must be combined with clinical observations, patient history, and epidemiological information. The expected result is Negative.  Fact Sheet for Patients:  BloggerCourse.com  Fact Sheet for Healthcare Providers:  SeriousBroker.it  This test is no t yet approved or cleared by the [place] FDA and  has been authorized for detection and/or diagnosis of SARS-CoV-2 by FDA under an Emergency Use Authorization (EUA). This EUA will remain  in effect (meaning this test can be used) for the duration of the COVID-19 declaration under Section 564(b)(1) of the Act, 21 U.S.C.section 360bbb-3(b)(1), unless the authorization is terminated  or revoked sooner.       Influenza A by PCR NEGATIVE NEGATIVE Final   Influenza B by PCR NEGATIVE NEGATIVE Final    Comment: (NOTE) The Xpert Xpress  SARS-CoV-2/FLU/RSV plus assay is intended as an aid in the diagnosis of influenza from Nasopharyngeal swab specimens and should not be used as a sole basis for treatment. Nasal washings and aspirates are unacceptable for Xpert Xpress SARS-CoV-2/FLU/RSV testing.  Fact Sheet for Patients: BloggerCourse.com  Fact Sheet for Healthcare Providers: SeriousBroker.it  This test is not yet approved or cleared by the [place] FDA and has been authorized for detection and/or diagnosis of SARS-CoV-2 by FDA under an Emergency Use Authorization (EUA). This EUA will remain in effect (meaning this test can be used) for the duration of the COVID-19 declaration under Section 564(b)(1) of the Act, 21 U.S.C. section 360bbb-3(b)(1), unless the authorization is terminated or revoked.  Performed at [facility], [address] [place], [place] [address]   Blood Culture (routine x 2)     Status: Abnormal   Collection Time: 05/24/20  9:45 PM   Specimen: BLOOD LEFT HAND  Result Value Ref Range Status   Specimen Description BLOOD LEFT HAND  Final   Special Requests   Final    BOTTLES DRAWN AEROBIC AND ANAEROBIC Blood Culture results may not be optimal due to an inadequate volume of blood received in culture bottles   Culture  Setup Time   Final    GRAM POSITIVE COCCI IN CLUSTERS IN BOTH AEROBIC AND ANAEROBIC BOTTLES CRITICAL VALUE NOTED.  VALUE IS CONSISTENT WITH PREVIOUSLY REPORTED AND CALLED VALUE.    Culture (A)  Final    STAPHYLOCOCCUS AUREUS SUSCEPTIBILITIES PERFORMED ON PREVIOUS CULTURE WITHIN THE LAST 5 DAYS. Performed at [facility], [address] [place], [place] [address]    Report Status 05/27/2020 FINAL  Final  MRSA PCR Screening     Status: None   Collection Time: 05/24/20 11:46 PM   Specimen: Nasopharyngeal  Result Value Ref Range Status   MRSA by PCR NEGATIVE NEGATIVE Final    Comment:        The GeneXpert MRSA  Assay (FDA approved for NASAL specimens only), is one component of a comprehensive MRSA colonization surveillance program. It is not intended to diagnose MRSA infection nor to guide or monitor treatment for MRSA infections. Performed at [facility], [address], [place], [place] [address]   Urine culture     Status: None   Collection Time: 05/24/20 11:59 PM   Specimen: In/Out Cath Urine  Result Value Ref Range Status   Specimen Description IN/OUT CATH URINE  Final   Special Requests NONE  Final   Culture   Final    NO GROWTH Performed at [facility], [address], [place], [place] [address]    Report Status 05/26/2020 FINAL  Final  Culture, blood (routine x 2)     Status: None   Collection Time: 05/25/20 10:19 PM   Specimen: BLOOD LEFT HAND  Result Value Ref Range Status   Specimen Description BLOOD LEFT HAND  Final   Special Requests   Final    BOTTLES DRAWN AEROBIC AND ANAEROBIC Blood Culture adequate volume   Culture   Final    NO GROWTH 5 DAYS Performed at [facility], [address] [place], [place] [address]    Report Status 05/30/2020 FINAL  Final  Culture, blood (routine x 2)     Status: None   Collection Time: 05/25/20 10:32 PM   Specimen: BLOOD RIGHT HAND  Result Value Ref Range Status   Specimen Description BLOOD RIGHT HAND  Final  Special Requests AEROBIC BOTTLE ONLY Blood Culture adequate volume  Final   Culture   Final    NO GROWTH 5 DAYS Performed at [facility], [address], [place], [place] [address]    Report Status 05/30/2020 FINAL  Final  Expectorated Sputum Assessment w Gram Stain, Rflx to Resp Cult     Status: None   Collection Time: 05/29/20  7:11 AM   Specimen: Expectorated Sputum  Result Value Ref Range Status   Specimen Description EXPECTORATED SPUTUM  Final   Special Requests Normal  Final   Sputum evaluation   Final    THIS SPECIMEN IS ACCEPTABLE FOR SPUTUM CULTURE Performed at [facility]  Lab, [address], [place], [place] [address]    Report Status 05/29/2020 FINAL  Final  Culture, Respiratory w Gram Stain     Status: None   Collection Time: 05/29/20  7:11 AM  Result Value Ref Range Status   Specimen Description EXPECTORATED SPUTUM  Final   Special Requests Normal Reflexed from [id number]  Final   Gram Stain   Final    ABUNDANT WBC PRESENT,BOTH PMN AND MONONUCLEAR FEW SQUAMOUS EPITHELIAL CELLS PRESENT ABUNDANT GRAM NEGATIVE RODS ABUNDANT GRAM POSITIVE COCCI FEW YEAST    Culture   Final    FEW Consistent with normal respiratory flora. No Pseudomonas species isolated Performed at [facility], [address], [place], [place] [address]    Report Status 06/01/2020 FINAL  Final  Culture, blood (routine x 2)     Status: None (Preliminary result)   Collection Time: 05/31/20  8:07 AM   Specimen: BLOOD LEFT HAND  Result Value Ref Range Status   Specimen Description BLOOD LEFT HAND  Final   Special Requests   Final    BOTTLES DRAWN AEROBIC AND ANAEROBIC Blood Culture adequate volume   Culture   Final    NO GROWTH 2 DAYS Performed at [facility], [address], [place], [place] [address]    Report Status PENDING  Incomplete  Culture, blood (routine x 2)     Status: None (Preliminary result)   Collection Time: 05/31/20  8:12 AM   Specimen: BLOOD RIGHT HAND  Result Value Ref Range Status   Specimen Description BLOOD RIGHT HAND  Final   Special Requests   Final    BOTTLES DRAWN AEROBIC ONLY Blood Culture results may not be optimal due to an inadequate volume of blood received in culture bottles   Culture   Final    NO GROWTH 2 DAYS Performed at [facility], [address], [place], [place] [address]    Report Status PENDING  Incomplete  Gram stain     Status: None   Collection Time: 05/31/20 11:52 AM   Specimen: Fluid  Result Value Ref Range Status   Specimen Description FLUID  Final   Special Requests PLEURAL FLUID LEFT  Final   Gram Stain    Final    FEW WBC PRESENT, PREDOMINANTLY PMN NO ORGANISMS SEEN Performed at [facility], [address], [place], [place] [address]    Report Status 05/31/2020 FINAL  Final  Culture, body fluid w Gram Stain-bottle     Status: None (Preliminary result)   Collection Time: 05/31/20 11:52 AM   Specimen: Fluid  Result Value Ref Range Status   Specimen Description FLUID  Final   Special Requests PLEURAL FLUID LEFT  Final   Culture   Final    NO  GROWTH 2 DAYS Performed at [facility], [address] [place], [place] [id number]    Report Status PENDING  Incomplete    Studies/Results: CT CHEST WO CONTRAST  Result Date: 05/31/2020 CLINICAL DATA:  Follow-up multi lobar bilateral cavitary pneumonia and pleural fluid. EXAM: CT CHEST WITHOUT CONTRAST TECHNIQUE: Multidetector CT imaging of the chest was performed following the standard protocol without IV contrast. COMPARISON:  Chest radiograph obtained earlier today following left thoracentesis. Chest CT obtained yesterday. FINDINGS: Cardiovascular: No significant vascular findings. Normal heart size. No pericardial effusion. Mediastinum/Nodes: The previously demonstrated 10 mm short axis precarinal node measures 9 mm in short axis diameter today. No new enlarged lymph nodes. Unremarkable thyroid gland and esophagus. Lungs/Pleura: Interval right basilar pleural pigtail catheter with a marked decrease in right pleural fluid. Only a small amount of residual pleural fluid remains at the posterior right lung base. Small to moderate-sized left pleural effusion, significantly decreased following thoracentesis. No pneumothorax. Large number of cavitary lesions are again demonstrated throughout both lungs with decreased soft tissue and patchy components. Upper Abdomen: Unremarkable. Musculoskeletal: Mild thoracic and lower cervical spine degenerative changes. IMPRESSION: 1. Interval right basilar pleural pigtail catheter with a marked decrease in right  pleural fluid. Only a small amount of residual pleural fluid remains at the posterior right lung base. 2. Small to moderate-sized left pleural effusion, significantly decreased following thoracentesis. 3. Large number of cavitary lesions throughout both lungs with decreased soft tissue and patchy components, compatible with improving septic emboli. 4. Stable mild reactive mediastinal adenopathy. Electronically Signed   By: Beckie SaltsSteven  Reid M.D.   On: 05/31/2020 23:08   DG Chest Port 1 View  Result Date: 06/02/2020 CLINICAL DATA:  Shortness of breath. EXAM: PORTABLE CHEST 1 VIEW COMPARISON:  Radiograph of same day.  CT of May 31, 2020. FINDINGS: The heart size and mediastinal contours are within normal limits. No definite pneumothorax is noted. Stable bilateral lung opacities are noted, left greater than right with associated pleural effusions most consistent with pneumonia. The visualized skeletal structures are unremarkable. IMPRESSION: Stable bilateral lung opacities are noted most consistent with pneumonia with small associated pleural effusions. Electronically Signed   By: Lupita RaiderJames  Green Jr M.D.   On: 06/02/2020 13:57   DG CHEST PORT 1 VIEW  Result Date: 06/02/2020 CLINICAL DATA:  Shortness of breath, chest tube EXAM: PORTABLE CHEST 1 VIEW COMPARISON:  05/31/2020 FINDINGS: Right basilar chest tube remains in place, unchanged. No pneumothorax. Patchy bilateral airspace disease, worsening on the left since prior study. Heart is normal size. Small bilateral effusions. No acute bony abnormality. IMPRESSION: No pneumothorax. Patchy bilateral airspace disease, worsening on the left. Small effusions. Electronically Signed   By: Charlett NoseKevin  Dover M.D.   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  While his procalcitonin has trended down his white cells however now at 45,000 and he is spiking temperatures to nearly 103 degrees.  I suspect the critical issue is management of the  pleural space   He now has had Chest tube removed  I would want to give him 6 weeks of IV antibiotics given findings in the lungs to suggest embolization of vegetations from right sided endocarditis        LOS: 9 days   Acey Lav 06/02/2020, 8:35 PM

## 2020-06-02 NOTE — Progress Notes (Addendum)
PROGRESS NOTE  Becky Berberian ZOX:[id number] DOB: Mar 15, 1970 DOA: 05/24/2020 PCP: Pcp, No  HPI/Recap of past 27 hours: 50 year old gentleman with no reported medical problems who donates plasma 2 times a week, donated plasma on 05/22/20 and after about 24 hours he started not feeling well.  Reported symptoms included generalized malaise, nausea, vomiting, shortness of breath, pleuritic chest pain, cough with blood-tinged sputum.  Came to the emergency room on 05/24/20, was febrile, tachypneic with lactic acidosis.  Found to have multilobular pneumonia on chest x-ray, left upper extremity cellulitis and left acute superficial vein thrombosis involving the left cephalic vein on 09/21/1912.   Was started on IV antibiotics and IV fluids and admitted to ICU for closer monitoring then later transferred to progressive care unit.  Work-up revealed MSSA bacteremia for which infectious disease was consulted.  Infective endocarditis was ruled out by TEE on 05/27/2020.  Due to worsening clinical picture PCCM was consulted.  Sputum culture from 05/29/2020 growing abundant gram-negative rods, abundant gram-positive cocci with few yeast, culture was reincubated for better growth.  He has been on cefazolin for MSSA bacteremia as recommended by infectious disease.  Repeated CT chest without contrast done on 05/30/2020 showed interval worsening of the aeration of the lungs.  IR was consulted for thoracentesis and chest tube placement due to loculated pleural effusion.    Post right thoracentesis by IR on 05/30/2020 with approximately 300 cc translucent, serosanguineous fluid removed and right chest tube placement.  Post left thoracentesis by IR on 05/31/2020 with 550 cc dark red fluid removed.  Chest tube is managed by PCCM.  06/02/20: Patient was seen and examined at his bedside.  Today [birth year] [age].  He is in good spirits.  States he feels better today.  Due to minimal output from his right sided chest tube, decision was made by  PCCM to have IR remove the chest tube.  A chest x-ray will be obtained in the morning to rule out recurrence of right-sided loculated pleural effusion and left-sided simple pleural effusion.  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 No evidence of valvular vegetation or infective endocarditis on TEE. Appreciate infectious disease and PCCM assistance. He is currently on IV cefazolin, continue as recommended by infectious disease. Repeated blood culture from 05/25/2020, negative final. Sputum culture obtained on 05/29/2020 revealed few consistent with normal respiratory flora, no Pseudomonas species isolated.   Blood culture repeated on 05/31/2020 peripherally negative to date. WBC is downtrending, procalcitonin is also downtrending.  Acute hypoxic respiratory failure secondary to multifocal pneumonia, loculated right parapneumonic effusion, simple left parapneumonic effusion, and extensive bilateral septic pulmonary emboli D-dimer on presentation greater than 4, repeated 2.83 on 06/02/2020. On IV antibiotics as stated above. Post left and right thoracentesis and right chest tube placement by IR. Plan to remove chest tube on 06/02/2020 due to significantly reduced output. Continue to maintain O2 saturation greater than 90%. Wean off oxygen supplementation as tolerated.  Loculated right parapneumonic effusion, simple left parapneumonic effusion, post right thoracentesis and right thoracostomy tube placement by IR on 05/30/2020, post left thoracentesis on 05/31/2020 by  IR. Plan to remove  right chest tube on 06/02/2020 due to low output. Follow chest x-ray in the morning.  Severe protein calorie malnutrition due to severe, critical illness Albumin 1.4 on 05/31/2020 BMI 19 Dietitian consulted for assessment of nutrition requirements Continue to encourage increase in oral protein calorie intake.  Resolved post repletion.  Hypocalcemia Calcium corrected for albumin 9.5.  Resolved Non anion gap metabolic acidosis  AKI on CKD 3B, improving. Presented with creatinine of 2.7 Creatinine downtrending 1.1 with GFR greater than 60. Continue to monitor urine output Repeat renal panel in the morning.  Hypovolemic hyponatremia Encourage increase in oral intake Serum sodium 129 from 131 from 134>> 136. Repeat BMP in the morning  Resolved post repletion: Hypokalemia Serum potassium 3.7 from 3.4. Repleted orally Serum magnesium 2.1 Repeat BMP in the morning  Resolved hypophosphatemia Encourage oral intake Serum phosphorus 2.9 from 2.4. Encourage oral intake.  Resolved left upper extremity cellulitis Initially presented with left upper extremity cellulitis which has been treated and has now resolved  Left upper extremity acute superficial vein thrombosis Continue to monitor  THC/tobacco use UDS positive for THC on 05/24/2020 Denies use of cocaine or intravenous drug abuse Cessation counseling when hemodynamically stable.  Generalized weakness in the setting of severe, critical illness Out of bed to chair with every shift as tolerated Ambulate patient with every shift as tolerated Increase oral protein calorie intake as tolerated PT assessed with no further recommendations. Continue fall precautions.      Code Status: Full code.  Family Communication: Updated his mother in person on 05/31/2020.  Disposition Plan: Likely will discharge to home once oxygen requirement is improved and infectious disease/IR/PCCM sign  off.   Consultants:  Infectious disease.  PCCM  IR  Procedures:  2D echo on 05/25/2020  TEE on 05/27/2020.  Chest tube placement for 922.  Plan for chest tube removal on 06/02/2020.  Antimicrobials:  Cefazolin for 05/25/20>>  Merrem/Zyvox 05/30/20, Dced 05/30/20 as recommended by ID.  DVT prophylaxis: Subcu heparin 3 times daily.  Status is: Inpatient    Dispo: The patient is from: Home.              Anticipated d/c is to: Home.              Patient currently not stable for discharge due to acute hypoxic respiratory failure, management of MSSA bacteremia, bilateral pleural effusions   Difficult to place patient: Not applicable.        Objective: Vitals:   06/02/20 1030 06/02/20 1042 06/02/20 1228 06/02/20 1430  BP: 139/85 139/85 (!) 148/75 140/85  Pulse: 95 95 95 96  Resp: 18 18 18 19   Temp: (!) 100.8 F (38.2 C) (!) 100.8 F (38.2 C)  99.8 F (37.7 C)  TempSrc: Oral   Oral  SpO2: 100%     Weight:      Height:        Intake/Output Summary (Last 24 hours) at 06/02/2020 1657 Last data filed at 06/02/2020 0158 Gross per 24 hour  Intake --  Output 1200 ml  Net -1200 ml   Filed Weights   05/24/20 1410 05/29/20 0500 06/01/20 0405  Weight: 68 kg 68.9 kg 70.2 kg    Exam:  . General: 50 y.o. year-old male weak appearing in no acute distress.  He is alert and oriented x3.   . Cardiovascular: Regular rate and rhythm no rubs or gallops. Marland Kitchen.  Respiratory: Mild rales at bases with no wheezing noted.  Poor inspiratory effort.  Right chest tube  in place at the time of this visit.  . Abdomen: Soft nontender with normal bowel sounds present. . Musculoskeletal: No lower extremity edema bilaterally. . Skin: No ulcerative lesions noted. Marland Kitchen Psychiatry: Mood is appropriate for condition and setting.  Data Reviewed: CBC: Recent Labs  Lab 05/28/20 0255 05/29/20 0308 05/30/20 0209 05/31/20 0124 06/01/20 0239 06/02/20 0422  WBC 16.4* 25.0* 34.8* 45.1* 44.2* 31.5*   NEUTROABS 12.9*  --   --  37.6* 38.3* 27.7*  HGB 12.5* 11.9* 12.5* 12.6* 11.6* 10.1*  HCT 35.8* 33.8* 36.5* 36.0* 33.9* 28.6*  MCV 92.5 92.6 94.6 94.2 93.1 93.8  PLT PLATELET CLUMPS NOTED ON SMEAR, UNABLE TO ESTIMATE 140* 194 334 422* 514*   Basic Metabolic Panel: Recent Labs  Lab 05/27/20 0244 05/28/20 0255 05/29/20 0308 05/30/20 0209 05/31/20 0124 06/01/20 0239 06/02/20 0422  NA 133* 135 134* 136 135 131* 129*  K 3.5 3.7 3.4* 3.5 3.7 3.7 3.7  CL 101 102 101 102 99 98 94*  CO2 25 21* GLUCOSE 116* 111* 122* 111* 132* 123* 104*  BUN 46* 48* 44* 38* 37* 39* 29*  CREATININE 1.90* 1.50* 1.51* 1.41* 1.46* 1.38* 1.16  CALCIUM 7.6* 7.4* 7.4* 7.4* 7.4* 7.1* 7.1*  MG 2.8* 3.0* 2.8* 2.7*  --   --  2.1  PHOS 2.4* 2.5 2.5 2.4*  --   --  2.9   GFR: Estimated Creatinine Clearance: 74.8 mL/min (by C-G formula based on SCr of 1.16 mg/dL). Liver Function Tests: Recent Labs  Lab 05/31/20 0124  AST 25  ALT 26  ALKPHOS 54  BILITOT 0.7  PROT 4.4*  ALBUMIN 1.4*   No results for input(s): LIPASE, AMYLASE in the last 168 hours. No results for input(s): AMMONIA in the last 168 hours. Coagulation Profile: No results for input(s): INR, PROTIME in the last 168 hours. Cardiac Enzymes: No results for input(s): CKTOTAL, CKMB, CKMBINDEX, TROPONINI in the last 168 hours. BNP (last 3 results) No results for input(s): PROBNP in the last 8760 hours. HbA1C: No results for input(s): HGBA1C in the last 72 hours. CBG: No results for input(s): GLUCAP in the last 168 hours. Lipid Profile: No results for input(s): CHOL, HDL, LDLCALC, TRIG, CHOLHDL, LDLDIRECT in the last 72 hours. Thyroid Function Tests: No results for input(s): TSH, T4TOTAL, FREET4, T3FREE, THYROIDAB in the last 72 hours. Anemia Panel: No results for input(s): VITAMINB12, FOLATE, FERRITIN, TIBC, IRON, RETICCTPCT in the last 72 hours. Urine analysis:    Component Value Date/Time   COLORURINE YELLOW 05/24/2020 0502    APPEARANCEUR HAZY (A) 05/24/2020 0502   LABSPEC 1.016 05/24/2020 0502   PHURINE 5.0 05/24/2020 0502   GLUCOSEU 50 (A) 05/24/2020 0502   HGBUR MODERATE (A) 05/24/2020 0502   BILIRUBINUR NEGATIVE 05/24/2020 0502   KETONESUR NEGATIVE 05/24/2020 0502   PROTEINUR 100 (A) 05/24/2020 0502   NITRITE NEGATIVE 05/24/2020 0502   LEUKOCYTESUR NEGATIVE 05/24/2020 0502   Sepsis Labs: (procalcitonin:4,lacticidven:4)  ) Recent Results (from the past 240 hour(s))  Blood Culture (routine x 2)     Status: Abnormal   Collection Time: 05/24/20  3:08 PM   Specimen: Left Antecubital; Blood  Result Value Ref Range Status   Specimen Description LEFT ANTECUBITAL  Final   Special Requests   Final    BOTTLES DRAWN AEROBIC AND ANAEROBIC Blood Culture results may not be optimal due to an inadequate volume of blood received in culture bottles   Culture  Setup Time   Final  GRAM POSITIVE COCCI IN CLUSTERS IN BOTH AEROBIC AND ANAEROBIC BOTTLES CRITICAL RESULT CALLED TO, READ BACK BY AND VERIFIED WITH: PHARMD A WOLF [id number] AT 754 AM BY CM Performed at [facility], [address] [place], [place] [address]    Culture STAPHYLOCOCCUS AUREUS (A)  Final   Report Status 05/27/2020 FINAL  Final   Organism ID, Bacteria STAPHYLOCOCCUS AUREUS  Final      Susceptibility   Staphylococcus aureus - MIC*    CIPROFLOXACIN <=0.5 SENSITIVE Sensitive     ERYTHROMYCIN >=8 RESISTANT Resistant     GENTAMICIN <=0.5 SENSITIVE Sensitive     OXACILLIN 0.5 SENSITIVE Sensitive     TETRACYCLINE <=1 SENSITIVE Sensitive     VANCOMYCIN <=0.5 SENSITIVE Sensitive     TRIMETH/SULFA <=10 SENSITIVE Sensitive     CLINDAMYCIN <=0.25 SENSITIVE Sensitive     RIFAMPIN <=0.5 SENSITIVE Sensitive     Inducible Clindamycin NEGATIVE Sensitive     * STAPHYLOCOCCUS AUREUS  Blood Culture ID Panel (Reflexed)     Status: Abnormal   Collection Time: 05/24/20  3:08 PM  Result Value Ref Range Status   Enterococcus faecalis NOT  DETECTED NOT DETECTED Final   Enterococcus Faecium NOT DETECTED NOT DETECTED Final   Listeria monocytogenes NOT DETECTED NOT DETECTED Final   Staphylococcus species DETECTED (A) NOT DETECTED Final    Comment: CRITICAL RESULT CALLED TO, READ BACK BY AND VERIFIED WITH: PHARMD A WOLF [id number] AT 800 BY CM    Staphylococcus aureus (BCID) DETECTED (A) NOT DETECTED Final    Comment: CRITICAL RESULT CALLED TO, READ BACK BY AND VERIFIED WITH: PHARMD A WOLF [id number] AT 800 BY CM    Staphylococcus epidermidis NOT DETECTED NOT DETECTED Final   Staphylococcus lugdunensis NOT DETECTED NOT DETECTED Final   Streptococcus species NOT DETECTED NOT DETECTED Final   Streptococcus agalactiae NOT DETECTED NOT DETECTED Final   Streptococcus pneumoniae NOT DETECTED NOT DETECTED Final   Streptococcus pyogenes NOT DETECTED NOT DETECTED Final   A.calcoaceticus-baumannii NOT DETECTED NOT DETECTED Final   Bacteroides fragilis NOT DETECTED NOT DETECTED Final   Enterobacterales NOT DETECTED NOT DETECTED Final   Enterobacter cloacae complex NOT DETECTED NOT DETECTED Final   Escherichia coli NOT DETECTED NOT DETECTED Final   Klebsiella aerogenes NOT DETECTED NOT DETECTED Final   Klebsiella oxytoca NOT DETECTED NOT DETECTED Final   Klebsiella pneumoniae NOT DETECTED NOT DETECTED Final   Proteus species NOT DETECTED NOT DETECTED Final   Salmonella species NOT DETECTED NOT DETECTED Final   Serratia marcescens NOT DETECTED NOT DETECTED Final   Haemophilus influenzae NOT DETECTED NOT DETECTED Final   Neisseria meningitidis NOT DETECTED NOT DETECTED Final   Pseudomonas aeruginosa NOT DETECTED NOT DETECTED Final   Stenotrophomonas maltophilia NOT DETECTED NOT DETECTED Final   Candida albicans NOT DETECTED NOT DETECTED Final   Candida auris NOT DETECTED NOT DETECTED Final   Candida glabrata NOT DETECTED NOT DETECTED Final   Candida krusei NOT DETECTED NOT DETECTED Final   Candida parapsilosis NOT DETECTED NOT DETECTED Final    Candida tropicalis NOT DETECTED NOT DETECTED Final   Cryptococcus neoformans/gattii NOT DETECTED NOT DETECTED Final   Meth resistant mecA/C and MREJ NOT DETECTED NOT DETECTED Final    Comment: Performed at [facility], [address] [place], [place] [address]  Resp Panel by RT-PCR (Flu A&B, Covid) Nasopharyngeal Swab     Status: None   Collection Time: 05/24/20  3:14 PM   Specimen: Nasopharyngeal Swab; Nasopharyngeal(NP) swabs in vial  transport medium  Result Value Ref Range Status   SARS Coronavirus 2 by RT PCR NEGATIVE NEGATIVE Final    Comment: (NOTE) SARS-CoV-2 target nucleic acids are NOT DETECTED.  The SARS-CoV-2 RNA is generally detectable in upper respiratory specimens during the acute phase of infection. The lowest concentration of SARS-CoV-2 viral copies this assay can detect is 138 copies/mL. A negative result does not preclude SARS-Cov-2 infection and should not be used as the sole basis for treatment or other patient management decisions. A negative result may occur with  improper specimen collection/handling, submission of specimen other than nasopharyngeal swab, presence of viral mutation(s) within the areas targeted by this assay, and inadequate number of viral copies(<138 copies/mL). A negative result must be combined with clinical observations, patient history, and epidemiological information. The expected result is Negative.  Fact Sheet for Patients:  BloggerCourse.com  Fact Sheet for Healthcare Providers:  SeriousBroker.it  This test is no t yet approved or cleared by the [place] FDA and  has been authorized for detection and/or diagnosis of SARS-CoV-2 by FDA under an Emergency Use Authorization (EUA). This EUA will remain  in effect (meaning this test can be used) for the duration of the COVID-19 declaration under Section 564(b)(1) of the Act, 21 U.S.C.section 360bbb-3(b)(1), unless the  authorization is terminated  or revoked sooner.       Influenza A by PCR NEGATIVE NEGATIVE Final   Influenza B by PCR NEGATIVE NEGATIVE Final    Comment: (NOTE) The Xpert Xpress SARS-CoV-2/FLU/RSV plus assay is intended as an aid in the diagnosis of influenza from Nasopharyngeal swab specimens and should not be used as a sole basis for treatment. Nasal washings and aspirates are unacceptable for Xpert Xpress SARS-CoV-2/FLU/RSV testing.  Fact Sheet for Patients: BloggerCourse.com  Fact Sheet for Healthcare Providers: SeriousBroker.it  This test is not yet approved or cleared by the [place] FDA and has been authorized for detection and/or diagnosis of SARS-CoV-2 by FDA under an Emergency Use Authorization (EUA). This EUA will remain in effect (meaning this test can be used) for the duration of the COVID-19 declaration under Section 564(b)(1) of the Act, 21 U.S.C. section 360bbb-3(b)(1), unless the authorization is terminated or revoked.  Performed at [facility], [address], [place], [place] [address]   Blood Culture (routine x 2)     Status: Abnormal   Collection Time: 05/24/20  9:45 PM   Specimen: BLOOD LEFT HAND  Result Value Ref Range Status   Specimen Description BLOOD LEFT HAND  Final   Special Requests   Final    BOTTLES DRAWN AEROBIC AND ANAEROBIC Blood Culture results may not be optimal due to an inadequate volume of blood received in culture bottles   Culture  Setup Time   Final    GRAM POSITIVE COCCI IN CLUSTERS IN BOTH AEROBIC AND ANAEROBIC BOTTLES CRITICAL VALUE NOTED.  VALUE IS CONSISTENT WITH PREVIOUSLY REPORTED AND CALLED VALUE.    Culture (A)  Final    STAPHYLOCOCCUS AUREUS SUSCEPTIBILITIES PERFORMED ON PREVIOUS CULTURE WITHIN THE LAST 5 DAYS. Performed at [facility], [address] [place], [place] [address]    Report Status 05/27/2020 FINAL  Final  MRSA PCR Screening      Status: None   Collection Time: 05/24/20 11:46 PM   Specimen: Nasopharyngeal  Result Value Ref Range Status   MRSA by PCR NEGATIVE NEGATIVE Final    Comment:        The GeneXpert MRSA Assay (FDA approved  for NASAL specimens only), is one component of a comprehensive MRSA colonization surveillance program. It is not intended to diagnose MRSA infection nor to guide or monitor treatment for MRSA infections. Performed at [facility], [address], [place], [place] [address]   Urine culture     Status: None   Collection Time: 05/24/20 11:59 PM   Specimen: In/Out Cath Urine  Result Value Ref Range Status   Specimen Description IN/OUT CATH URINE  Final   Special Requests NONE  Final   Culture   Final    NO GROWTH Performed at [facility], [address] [place], [place] [address]    Report Status 05/26/2020 FINAL  Final  Culture, blood (routine x 2)     Status: None   Collection Time: 05/25/20 10:19 PM   Specimen: BLOOD LEFT HAND  Result Value Ref Range Status   Specimen Description BLOOD LEFT HAND  Final   Special Requests   Final    BOTTLES DRAWN AEROBIC AND ANAEROBIC Blood Culture adequate volume   Culture   Final    NO GROWTH 5 DAYS Performed at [facility], [address], [place], [place] [address]    Report Status 05/30/2020 FINAL  Final  Culture, blood (routine x 2)     Status: None   Collection Time: 05/25/20 10:32 PM   Specimen: BLOOD RIGHT HAND  Result Value Ref Range Status   Specimen Description BLOOD RIGHT HAND  Final   Special Requests AEROBIC BOTTLE ONLY Blood Culture adequate volume  Final   Culture   Final    NO GROWTH 5 DAYS Performed at [facility], [address] [place], [place] [address]    Report Status 05/30/2020 FINAL  Final  Expectorated Sputum Assessment w Gram Stain, Rflx to Resp Cult     Status: None   Collection Time: 05/29/20  7:11 AM   Specimen: Expectorated Sputum  Result Value Ref Range Status    Specimen Description EXPECTORATED SPUTUM  Final   Special Requests Normal  Final   Sputum evaluation   Final    THIS SPECIMEN IS ACCEPTABLE FOR SPUTUM CULTURE Performed at [facility], [address], [place], [place] [address]    Report Status 05/29/2020 FINAL  Final  Culture, Respiratory w Gram Stain     Status: None   Collection Time: 05/29/20  7:11 AM  Result Value Ref Range Status   Specimen Description EXPECTORATED SPUTUM  Final   Special Requests Normal Reflexed from [id number]  Final   Gram Stain   Final    ABUNDANT WBC PRESENT,BOTH PMN AND MONONUCLEAR FEW SQUAMOUS EPITHELIAL CELLS PRESENT ABUNDANT GRAM NEGATIVE RODS ABUNDANT GRAM POSITIVE COCCI FEW YEAST    Culture   Final    FEW Consistent with normal respiratory flora. No Pseudomonas species isolated Performed at [facility], [address], [place], [place] [address]    Report Status 06/01/2020 FINAL  Final  Culture, blood (routine x 2)     Status: None (Preliminary result)   Collection Time: 05/31/20  8:07 AM   Specimen: BLOOD LEFT HAND  Result Value Ref Range Status   Specimen Description BLOOD LEFT HAND  Final   Special Requests   Final    BOTTLES DRAWN AEROBIC AND ANAEROBIC Blood Culture adequate volume   Culture   Final    NO GROWTH 2 DAYS Performed at [facility], [address] [place], [place] [address]  Report Status PENDING  Incomplete  Culture, blood (routine x 2)     Status: None (Preliminary result)   Collection Time: 05/31/20  8:12 AM   Specimen: BLOOD RIGHT HAND  Result Value Ref Range Status   Specimen Description BLOOD RIGHT HAND  Final   Special Requests   Final    BOTTLES DRAWN AEROBIC ONLY Blood Culture results may not be optimal due to an inadequate volume of blood received in culture bottles   Culture   Final    NO GROWTH 2 DAYS Performed at [facility], [address] [place], [place] [address]    Report Status PENDING  Incomplete  Gram stain     Status:  None   Collection Time: 05/31/20 11:52 AM   Specimen: Fluid  Result Value Ref Range Status   Specimen Description FLUID  Final   Special Requests PLEURAL FLUID LEFT  Final   Gram Stain   Final    FEW WBC PRESENT, PREDOMINANTLY PMN NO ORGANISMS SEEN Performed at [facility], [address] [place], [place] [address]    Report Status 05/31/2020 FINAL  Final  Culture, body fluid w Gram Stain-bottle     Status: None (Preliminary result)   Collection Time: 05/31/20 11:52 AM   Specimen: Fluid  Result Value Ref Range Status   Specimen Description FLUID  Final   Special Requests PLEURAL FLUID LEFT  Final   Culture   Final    NO GROWTH 2 DAYS Performed at [facility], [address], [place], [place] [address]    Report Status PENDING  Incomplete      Studies: DG Chest Port 1 View  Result Date: 06/02/2020 CLINICAL DATA:  Shortness of breath. EXAM: PORTABLE CHEST 1 VIEW COMPARISON:  Radiograph of same day.  CT of May 31, 2020. FINDINGS: The heart size and mediastinal contours are within normal limits. No definite pneumothorax is noted. Stable bilateral lung opacities are noted, left greater than right with associated pleural effusions most consistent with pneumonia. The visualized skeletal structures are unremarkable. IMPRESSION: Stable bilateral lung opacities are noted most consistent with pneumonia with small associated pleural effusions. Electronically Signed   By: Lupita Raider M.D.   On: 06/02/2020 13:57   DG CHEST PORT 1 VIEW  Result Date: 06/02/2020 CLINICAL DATA:  Shortness of breath, chest tube EXAM: PORTABLE CHEST 1 VIEW COMPARISON:  05/31/2020 FINDINGS: Right basilar chest tube remains in place, unchanged. No pneumothorax. Patchy bilateral airspace disease, worsening on the left since prior study. Heart is normal size. Small bilateral effusions. No acute bony abnormality. IMPRESSION: No pneumothorax. Patchy bilateral airspace disease, worsening on the left. Small  effusions. Electronically Signed   By: Charlett Nose M.D.   On: 06/02/2020 08:10    Scheduled Meds: . heparin  5,000 Units Subcutaneous Q8H  . sodium chloride flush  10 mL Intracatheter Q8H    Continuous Infusions: .  ceFAZolin (ANCEF) IV 2 g (06/02/20 1436)     LOS: 9 days     Darlin Drop, MD [facility] Pager [phone]  If 7PM-7AM, please contact night-coverage [email or web address] Password [facility] 06/02/2020, 4:57 PM

## 2020-06-02 NOTE — Progress Notes (Signed)
Physical Therapy Treatment Patient Details Name: Edward Mueller MRN: [id number] DOB: Dec 04, 1970 Today's Date: 06/02/2020    History of Present Illness Pt is a 51 y.o. 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 Chest CT shows multiple peripheral infiltrates consistent with embolic phenomena; concern for endocarditis. Workup for compensated septic shock due to LUE cellulitis, MSSA bactermia of unclear source.  S/p TEE 4/6 with no evidence of endocarditis. UDS (+) THC. Pt with loculated R pleural effusion s/p R chest tube placemtn 4/9. S/p L thoracentesis 4/10. S/p chest tube removal 4/12. No reported PMH; pt reports donating plasma 2x/wk.   PT Comments    Pt progressing with mobility. Trialled gait training without RW this session, pt requiring intermittent min guard for balance. Pt's anxiety improving, but still limited by short, shallow breathing with minimal activity. Difficulty getting reliable pulse ox reading with ambulation; once finger probe changed, SpO2 90-91% on RA at rest. Pt still doing little activity outside of PT sessions; encouraged more frequent bouts of OOB mobility with nursing staff. Will continue to follow acutely.   Follow Up Recommendations  No PT follow up     Equipment Recommendations  None recommended by PT    Recommendations for Other Services       Precautions / Restrictions Precautions Precautions: Fall;Other (comment) Precaution Comments: SOB with minimal activity Restrictions Weight Bearing Restrictions: No    Mobility  Bed Mobility Overal bed mobility: Modified Independent Bed Mobility: Supine to Sit           General bed mobility comments: Improved recovery time once sitting EOB    Transfers Overall transfer level: Needs assistance Equipment used: None Transfers: Sit to/from Stand Sit to Stand: Supervision          General transfer comment: Initial LOB on standing requiring UE support to correct; progressing to supervision for safety standing from EOB, low toilet height and recliner without UE support  Ambulation/Gait Ambulation/Gait assistance: Min guard;Supervision Gait Distance (Feet): 70 Feet Assistive device: IV Pole;None   Gait velocity: Decreased   General Gait Details: Pt preferance to use IV pole for UE support; slow, mostly steady, fatigued gait with intermittent min guard for balance; able to take a few steps without UE support, pt very guarded with short, shallow breaths. Unable to get reliable pulse ox read during ambulation on RA   Stairs             Wheelchair Mobility    Modified Rankin (Stroke Patients Only)       Balance Overall balance assessment: Needs assistance   Sitting balance-Leahy Scale: Good       Standing balance-Leahy Scale: Fair                              Cognition Arousal/Alertness: Awake/alert Behavior During Therapy: WFL for tasks assessed/performed;Flat affect Overall Cognitive Status: Within Functional Limits for tasks assessed                                 General Comments: Improving anxiety and affect with mobility      Exercises      General Comments General comments (skin integrity, edema, etc.): SpO2 90-91% on RA with seated rest; unable to get reliable reading during ambulation on RA. Pt still reports minimal OOB mobility with nursing staff,  did not get oOB yesterday ("I was too weak") - encouraged way more frequent daily activity (including up to chair, up to actual bathroom, hallway ambulation) with nursing staff      Pertinent Vitals/Pain Pain Assessment: Faces Faces Pain Scale: Hurts a little bit Pain Location: Chest tube removal site, chest with coughing Pain Descriptors / Indicators: Guarding;Grimacing;Tightness Pain Intervention(s): Monitored during session    Home Living                       Prior Function            PT Goals (current goals can now be found in the care plan section) Progress towards PT goals: Progressing toward goals    Frequency    Min 3X/week      PT Plan Current plan remains appropriate    Co-evaluation              AM-PAC PT "6 Clicks" Mobility   Outcome Measure  Help needed turning from your back to your side while in a flat bed without using bedrails?: None Help needed moving from lying on your back to sitting on the side of a flat bed without using bedrails?: None Help needed moving to and from a bed to a chair (including a wheelchair)?: A Little Help needed standing up from a chair using your arms (e.g., wheelchair or bedside chair)?: A Little Help needed to walk in hospital room?: A Little Help needed climbing 3-5 steps with a railing? : A Little 6 Click Score: 20    End of Session Equipment Utilized During Treatment: Gait belt Activity Tolerance: Patient tolerated treatment well Patient left: in chair;with call bell/phone within reach Nurse Communication: Mobility status PT Visit Diagnosis: Other abnormalities of gait and mobility (R26.89);Other (comment)     Time: 2751-7001 PT Time Calculation (min) (ACUTE ONLY): 21 min  Charges:  $Gait Training: 8-22 mins                     Ina Homes, PT, DPT Acute Rehabilitation Services  Pager [phone] Office [phone]  Malachy Chamber 06/02/2020, 2:48 PM

## 2020-06-02 NOTE — Progress Notes (Signed)
NAME:  Edward Mueller, MRN:  [id number], DOB:  October 09, 1970, LOS: 9 ADMISSION DATE:  05/24/2020, CONSULTATION DATE:  05/24/2020 REFERRING MD:  Lockie Mola, CHIEF COMPLAINT:  Dyspnea   History of Present Illness:  50 year old man who presented to [facility] on 4/3 with c/o dyspnea.    Reportedly was in his usual state of health until 48 hours prior to admit. 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  On 4/1 he donated plasma.  After that he woke with a "knot on his arm" and felt poorly.  He had malaise, N/V and SOB with associated central chest pain.  He began coughing up bloody sputum.  He was found to have sepsis in the setting of left arm cellulitis, extensive PNA, MSSA bacteremia & AKI.  TEE negative for endocarditis.   Now s/p R chest tube and L thoracentesis performed by IR.   Pertinent  Medical History  Tobacco / THC use  Significant Hospital Events: Including procedures, antibiotic start and stop dates in addition to other pertinent events    4/3 Admit with dyspnea, febrile with elevated lactate, imaging consistent with PNA, L arm cellulitis. Negative HIV, COVID, Flu. Cultures positive for MSSA.   4/4 To [facility]   4/6 TEE negative for vegetation   4/7 PCCM called back for increased O2 needs. O2 weaned to 3L on exam, sats 97%. VBG 7.4 / 42s  4/9 chest CT revealed bilateral pleural effusions with appearance of loculation, IR consulted and placed right pigtail chest tube patient received first dose of TPA/dornase  4/10  2L removed via chest tube since insertion.  Second dose TPA dornase administered  4/10 L thoracentesis performed by IR yielding 550 mL dark red fluid  4/11 another chest tube drainage    Interim History / Subjective:  Still having pain around chest tube, but L side has not been worse.  T-max 100.9  Objective   Blood pressure 139/85, pulse 95, temperature (!) 100.8 F (38.2 C), resp. rate 18, height 6\' 3"   (1.905 m), weight 70.2 kg, SpO2 100 %.        Intake/Output Summary (Last 24 hours) at 06/02/2020 1137 Last data filed at 06/02/2020 0158 Gross per 24 hour  Intake --  Output 1200 ml  Net -1200 ml   Filed Weights   05/24/20 1410 05/29/20 0500 06/01/20 0405  Weight: 68 kg 68.9 kg 70.2 kg    Examination: General: Ill-appearing middle-aged man sitting up watching TV HEENT: [place]/AT, eyes anicteric Cardio: S1-S2, regular rate and rhythm Respiratory: Tachypneic, still shallow breathing due to splinting from pain.  Blood-streaked sputum.  Occasional coughing during encounter.  CTAB anteriorly, reduced in both bases.  Minimal chest tube output-at 1360 cc total in atrium Abdomen: Thin, soft, nondistended Extremities: no clubbing or edema Derm: Skin warm, dry, no rashes Neuro: Awake, interactive, moving all extremities spontaneously.  Labs/imaging that I havepersonally reviewed  (right click and "Reselect all SmartList Selections" daily)  CXR personally reviewed> bilateral airspace disease, right chest tube remains in place without increased accumulation of effusion on either side.  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 -AM CXR to monitor for reaccumulation of left-sided effusion.  If he has significant reaccumulation on the left he would require a left-sided chest tube rather than repeat thoracentesis -Follow culture from left-sided pleural effusion until finalized -Appreciate infectious disease team's recommendations.  Continue cefazolin. Continue to follow fever curve and WBC  MSSA bacteremia Per primary. - IV abx  as above   Best practice (right click and "Reselect all SmartList Selections" daily)  Per primary  Labs   CBC: Recent Labs  Lab 05/28/20 0255 05/29/20 0308 05/30/20 0209 05/31/20 0124 06/01/20 0239 06/02/20 0422  WBC 16.4* 25.0* 34.8* 45.1* 44.2* 31.5*  NEUTROABS 12.9*  --   --  37.6* 38.3* 27.7*  HGB 12.5* 11.9* 12.5* 12.6* 11.6* 10.1*  HCT 35.8* 33.8* 36.5* 36.0* 33.9* 28.6*  MCV 92.5 92.6 94.6 94.2 93.1 93.8  PLT PLATELET CLUMPS NOTED ON SMEAR, UNABLE TO ESTIMATE 140* 194 334 422* 514*    Basic Metabolic Panel: Recent Labs  Lab 05/27/20 0244 05/28/20 0255 05/29/20 0308 05/30/20 0209 05/31/20 0124 06/01/20 0239 06/02/20 0422  NA 133* 135 134* 136 135 131* 129*  K 3.5 3.7 3.4* 3.5 3.7 3.7 3.7  CL 101 102 101 102 99 98 94*  CO2 25 21* 26 28 28 27 27   GLUCOSE 116* 111* 122* 111* 132* 123* 104*  BUN 46* 48* 44* 38* 37* 39* 29*  CREATININE 1.90* 1.50* 1.51* 1.41* 1.46* 1.38* 1.16  CALCIUM 7.6* 7.4* 7.4* 7.4* 7.4* 7.1* 7.1*  MG 2.8* 3.0* 2.8* 2.7*  --   --  2.1  PHOS 2.4* 2.5 2.5 2.4*  --   --  2.9   GFR: Estimated Creatinine Clearance: 74.8 mL/min (by C-G formula based on SCr of 1.16 mg/dL). Recent Labs  Lab 05/28/20 0255 05/28/20 0631 05/29/20 0308 05/30/20 0209 05/31/20 0124 06/01/20 0239 06/02/20 0422  PROCALCITON  --  31.08 20.43  --  7.37  --  2.85  WBC 16.4*  --  25.0* 34.8* 45.1* 44.2* 31.5*  LATICACIDVEN 1.0  --   --   --   --   --   --     08/02/20, DO 06/02/20 11:59 AM [facility]  For contact information, see Amion. If no response to pager, please call PCCM consult pager. After hours, 7PM- 7AM, please call Elink.

## 2020-06-02 NOTE — Progress Notes (Addendum)
Referring Physician(s): Noemi Chapel, DO (CCM)  Supervising Physician: Arne Cleveland  Patient Status:  [facility] - In-pt  Chief Complaint: Follow up right pigtail chest tube placed 05/30/20 in IR  Subjective:  Patient laying in bed with family at bedside, they are celebrating his 50st birthday today. He tells me that he is breathing more comfortably but has some pain with coughing/deep breaths. He has not had further hemoptysis this morning. The insertion site is mildly tender when he leans on that side. His family is wondering if his CXR looks ok today and how long the tube will need to stay in.   Allergies: Patient has no known allergies.  Medications: Prior to Admission medications   Medication Sig Start Date End Date Taking? Authorizing Provider  ceFAZolin (ANCEF) IVPB Inject 2 g into the vein every 8 (eight) hours. Indication: Bacteremia First Dose: Yes Last Day of Therapy: 07/06/20 Labs - Once weekly:  CBC/D and BMP, Labs - Every other week:  ESR and CRP Method of administration: IV Push Method of administration may be changed at the discretion of home infusion pharmacist based upon assessment of the patient and/or caregiver's ability to self-administer the medication ordered. 06/02/20 07/07/20 Yes Kayleen Memos, DO     Vital Signs: BP 139/85   Pulse 95   Temp (!) 100.8 F (38.2 C)   Resp 18   Ht 6' 3"  (1.905 m)   Wt 154 lb 12.2 oz (70.2 kg)   SpO2 100%   BMI 19.34 kg/m   Physical Exam Vitals and nursing note reviewed.  Constitutional:      General: He is not in acute distress.    Appearance: He is ill-appearing.  HENT:     Head: Normocephalic.  Cardiovascular:     Rate and Rhythm: Normal rate and regular rhythm.  Pulmonary:     Effort: Pulmonary effort is normal.     Comments: Diminished breath sounds bilateral lower fields. Right chest tube in place with ~1600 cc serosanguineous output, no obvious air leak noted. No SQ emphysema. Insertion site mildly tender  to palpation - clean/dry/dressed appropriately.  Skin:    General: Skin is warm and dry.  Neurological:     Mental Status: He is alert. Mental status is at baseline.     Imaging: DG Chest 1 View  Result Date: 05/31/2020 CLINICAL DATA:  50 year old male status post left thoracentesis. Follow-up study. EXAM: CHEST  1 VIEW COMPARISON:  Chest x-ray 05/31/2020. FINDINGS: Previously noted left pleural effusion has decreased and is now very small. No appreciable left-sided pneumothorax. Trace right pleural effusion. Small bore pigtail drainage catheter in the inferior aspect of the right hemithorax. Several areas of airspace consolidation and cavitation are again noted in the lungs bilaterally, similar to the prior study. No pneumothorax. No evidence of pulmonary edema. Heart size is normal. Upper mediastinal contours are within normal limits. IMPRESSION: 1. Decreased now small left pleural effusion following left-sided thoracentesis. No pneumothorax. Multilobar bilateral cavitary pneumonia, similar to the prior examination. Electronically Signed   By: Vinnie Langton M.D.   On: 05/31/2020 12:24   CT CHEST WO CONTRAST  Result Date: 05/31/2020 CLINICAL DATA:  Follow-up multi lobar bilateral cavitary pneumonia and pleural fluid. EXAM: CT CHEST WITHOUT CONTRAST TECHNIQUE: Multidetector CT imaging of the chest was performed following the standard protocol without IV contrast. COMPARISON:  Chest radiograph obtained earlier today following left thoracentesis. Chest CT obtained yesterday. FINDINGS: Cardiovascular: No significant vascular findings. Normal heart size. No pericardial effusion.  Mediastinum/Nodes: The previously demonstrated 10 mm short axis precarinal node measures 9 mm in short axis diameter today. No new enlarged lymph nodes. Unremarkable thyroid gland and esophagus. Lungs/Pleura: Interval right basilar pleural pigtail catheter with a marked decrease in right pleural fluid. Only a small amount of  residual pleural fluid remains at the posterior right lung base. Small to moderate-sized left pleural effusion, significantly decreased following thoracentesis. No pneumothorax. Large number of cavitary lesions are again demonstrated throughout both lungs with decreased soft tissue and patchy components. Upper Abdomen: Unremarkable. Musculoskeletal: Mild thoracic and lower cervical spine degenerative changes. IMPRESSION: 1. Interval right basilar pleural pigtail catheter with a marked decrease in right pleural fluid. Only a small amount of residual pleural fluid remains at the posterior right lung base. 2. Small to moderate-sized left pleural effusion, significantly decreased following thoracentesis. 3. Large number of cavitary lesions throughout both lungs with decreased soft tissue and patchy components, compatible with improving septic emboli. 4. Stable mild reactive mediastinal adenopathy. Electronically Signed   By: Claudie Revering M.D.   On: 05/31/2020 23:08   CT CHEST WO CONTRAST  Result Date: 05/30/2020 CLINICAL DATA:  Respiratory failure. Staph aureus bacteremia. EXAM: CT CHEST WITHOUT CONTRAST TECHNIQUE: Multidetector CT imaging of the chest was performed following the standard protocol without IV contrast. COMPARISON:  May 24, 2020 FINDINGS: Cardiovascular: Mildly enlarged cardiac silhouette. No sizable pericardial effusion. Mediastinum/Nodes: Borderline enlarged precarinal lymph node measures 10 mm in short axis, likely reactive. Other shotty mediastinal lymph nodes are present. Trachea and main bronchi are patent. The esophagus is normal. Lungs/Pleura: Interval worsening of the aeration of the lungs with numerous predominantly peripheral areas of nodular airspace consolidation, some confluent in the right upper lobe, lingula and bilateral lower lobes. There has been interval development of cavitations within multiple of these areas of airspace consolidation. There are moderate in size bilateral pleural  effusions. Overall the aeration of the lungs has worsened. Upper Abdomen: No acute abnormality. Musculoskeletal: No chest wall mass or suspicious bone lesions identified. IMPRESSION: 1. Interval worsening of the aeration of the lungs with numerous predominantly peripheral areas of nodular and confluent airspace consolidation. Interval development of cavitations within multiple of these areas of airspace consolidation. These findings are most consistent with worsening multifocal pneumonia and numerous septic emboli. 2. Moderate in size bilateral pleural effusions. 3. Borderline enlarged precarinal lymph node, likely reactive. 4. Mild cardiomegaly. Electronically Signed   By: Fidela Salisbury M.D.   On: 05/30/2020 12:28   DG CHEST PORT 1 VIEW  Result Date: 06/02/2020 CLINICAL DATA:  Shortness of breath, chest tube EXAM: PORTABLE CHEST 1 VIEW COMPARISON:  05/31/2020 FINDINGS: Right basilar chest tube remains in place, unchanged. No pneumothorax. Patchy bilateral airspace disease, worsening on the left since prior study. Heart is normal size. Small bilateral effusions. No acute bony abnormality. IMPRESSION: No pneumothorax. Patchy bilateral airspace disease, worsening on the left. Small effusions. Electronically Signed   By: Rolm Baptise M.D.   On: 06/02/2020 08:10   DG CHEST PORT 1 VIEW  Result Date: 05/31/2020 CLINICAL DATA:  Chest tube in place EXAM: PORTABLE CHEST 1 VIEW COMPARISON:  May 30, 2020 chest radiograph and chest CT FINDINGS: Pigtail catheter noted at right base, unchanged. No pneumothorax. Multiple opacities throughout the lungs, some of which are cavitated, persist without change. There is consolidation in the left base with small left pleural effusion. Partial clearing of opacity from right base compared to 1 day prior. Heart is mildly enlarged with pulmonary vascularity within normal  limits. No adenopathy. No bone lesions. IMPRESSION: Pigtail catheter at right base. No pneumothorax.  Multiple cavitary lesions, likely septic emboli, persist. Consolidation left lower lung region with small left pleural effusion. There has been partial clearing of opacity from the right base compared to 1 day prior. Stable cardiac silhouette. Electronically Signed   By: Lowella Grip III M.D.   On: 05/31/2020 08:07   DG CHEST PORT 1 VIEW  Result Date: 05/30/2020 CLINICAL DATA:  Shortness of breath. EXAM: PORTABLE CHEST 1 VIEW COMPARISON:  05/28/2020 FINDINGS: Diffuse patchy and nodular bilateral airspace disease is stable. Some of the nodules are cavitated. The cardiopericardial silhouette is within normal limits for size. Pleural drain noted over the right lower hemithorax. Telemetry leads overlie the chest. IMPRESSION: No substantial interval change diffuse bilateral patchy and nodular airspace disease bilaterally. Electronically Signed   By: Misty Stanley M.D.   On: 05/30/2020 15:29   US THORACENTESIS ASP PLEURAL SPACE W/IMG GUIDE  Result Date: 05/31/2020 INDICATION: Patient with history of sepsis, MSSA bacteremia, acute hypoxic respiratory failure, bilateral pneumonia, and bilateral pleural effusions s/p right chest tube placement in IR 05/30/2020. Request is made for diagnostic and therapeutic left thoracentesis. EXAM: ULTRASOUND GUIDED DIAGNOSTIC AND THERAPEUTIC LEFT THORACENTESIS MEDICATIONS: 10 mL 1% lidocaine COMPLICATIONS: None immediate. PROCEDURE: An ultrasound guided thoracentesis was thoroughly discussed with the patient and questions answered. The benefits, risks, alternatives and complications were also discussed. The patient understands and wishes to proceed with the procedure. Written consent was obtained. Ultrasound was performed to localize and mark an adequate pocket of fluid in the left chest. The area was then prepped and draped in the normal sterile fashion. 1% Lidocaine was used for local anesthesia. Under ultrasound guidance a 6 Fr Safe-T-Centesis catheter was introduced.  Thoracentesis was performed. The catheter was removed and a dressing applied. FINDINGS: A total of approximately 550 mL of dark red fluid was removed. Samples were sent to the laboratory as requested by the clinical team. IMPRESSION: Successful ultrasound guided left thoracentesis yielding 550 mL of pleural fluid. Read by: Earley Abide, PA-C Electronically Signed   By: Ruthann Cancer MD   On: 05/31/2020 12:30   US THORACENTESIS ASP PLEURAL SPACE W/IMG GUIDE  Result Date: 05/30/2020 INDICATION: 50 year old male with bacteremia, pulmonary septic emboli, and bilateral pleural effusions, right greater than left. EXAM: 1. Ultrasound-guided puncture of right pleural space. 2. Placement of right thoracostomy tube. MEDICATIONS: The patient is currently admitted to the hospital and receiving intravenous antibiotics. The antibiotics were administered within an appropriate time frame prior to the initiation of the procedure. ANESTHESIA/SEDATION: Local anesthesia only. COMPLICATIONS: None immediate. PROCEDURE: Informed written consent was obtained from the patient after a thorough discussion of the procedural risks, benefits and alternatives. All questions were addressed. Maximal Sterile Barrier Technique was utilized including caps, mask, sterile gowns, sterile gloves, sterile drape, hand hygiene and skin antiseptic. A timeout was performed prior to the initiation of the procedure. Preprocedure ultrasound evaluation demonstrated mildly complex fluid collection within the right pleural space of small to moderate volume. Given complexity of fluid, a pigtail thoracostomy tube was requested by the ordering provider. The procedure was planned. The right lower back was prepped and draped in standard fashion. Subdermal Local anesthesia was provided at the planned needle entry site with 1% lidocaine. Deeper local anesthetic was administered under ultrasound guidance along the pleura at the planned needle entry site. A small skin  nick was made. Under direct ultrasound visualization, a 7 cm, 5 [place] Yueh needle  was directed into the right pleural space. A stiff Amplatz wire was inserted through the Yueh catheter and serial dilation was performed prior to placement of 10.2 French pigtail thoracostomy tube. The pigtail portion was formed. Ultrasound evaluation demonstrated position of the catheter within the pleural space. There was immediate E flux of translucent, serosanguineous fluid. The catheter was connected to a pleura vac and set to wall suction. There was immediate evacuation of approximately 300 mL of fluid. The drainage catheter was sutured at the skin entry site and a sterile bandage was applied with nonocclusive gauze around the skin entry site. The patient tolerated the procedure well was transferred back to the floor in stable condition. IMPRESSION: Technically successful right basilar pigtail thoracostomy tube (10.2 Fr) placement. Ruthann Cancer, MD [facility] Electronically Signed   By: Ruthann Cancer MD   On: 05/30/2020 16:06    Labs:  CBC: Recent Labs    05/30/20 0209 05/31/20 0124 06/01/20 0239 06/02/20 0422  WBC 34.8* 45.1* 44.2* 31.5*  HGB 12.5* 12.6* 11.6* 10.1*  HCT 36.5* 36.0* 33.9* 28.6*  PLT 194 334 422* 514*    COAGS: Recent Labs    05/24/20 1417  INR 1.1  APTT 28    BMP: Recent Labs    05/30/20 0209 05/31/20 0124 06/01/20 0239 06/02/20 0422  NA 136 135 131* 129*  K 3.5 3.7 3.7 3.7  CL 102 99 98 94*  CO2 28 28 27 27   GLUCOSE 111* 132* 123* 104*  BUN 38* 37* 39* 29*  CALCIUM 7.4* 7.4* 7.1* 7.1*  CREATININE 1.41* 1.46* 1.38* 1.16  GFRNONAA >60 58* >60 >60    LIVER FUNCTION TESTS: Recent Labs    05/24/20 1417 05/31/20 0124  BILITOT 1.2 0.7  AST 35 25  ALT 24 26  ALKPHOS 56 54  PROT 6.0* 4.4*  ALBUMIN 2.6* 1.4*    Assessment and Plan:  50 year old M admitted with MSSA bacteremia, 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 Patient is s/p TPA infusion on 4/10 per CCM.   Patient reports improvement in dyspnea with some continued pain with coughing. Output 125 cc this AM per I/O - no obvious air leak or SQ emphysema noted on exam today. CXR shows chest tube to be in appropriate position without pneumothorax, worsening left sided airspace disease. Tmax 100.8,  WBC 31.5, hgb 10.1, plt 514. Culture of left pleural fluid shows NGTD. He continues on Ancef per primary team.  Continue current chest tube management - per most recent CCM note no role for further TPA, continue right sided chest tube until output slows. Timing of removal of chest tube per CCM - IR remains available as needed, please call with questions or concerns.  ADDENDUM @ 12:18 -- after CCM rounds decision made to remove chest tube, will plan to remove chest tube at bedside later today.  Electronically Signed: Joaquim Nam, PA-C 06/02/2020, 11:21 AM   I spent a total of 15 Minutes at the the patient's bedside AND on the patient's hospital floor or unit, greater than 50% of which was counseling/coordinating care for right chest tube follow up.

## 2020-06-02 NOTE — Progress Notes (Signed)
IR consulted by Dr. Chestine Spore for possible right chest tube removal.  History of bilateral pneumonia complicated by loculated right pleural effusion s/p right chest tube placement in IR 05/30/2020.  Per notes, ok to D/C right chest tube today given significant reduction in output per CCM. Case discussed with Dr. Deanne Coffer who approves right chest tube removal.  Right chest tube removed bedside, removed intact.  No immediate complications, EBL = 0 mL, patient tolerated procedure well. Pressure dressing (petroelum gauze + gauze + tegaderm) applied to site. Post-removal CXR ordered.  Post-removal instructions: 1- Ok to shower 48 hours post-removal. 2- No submerging (swimming, bathing) for 7 days post-removal.  Please call IR with questions/concerns.   Waylan Boga Solae Norling, PA-C 06/02/2020, 1:04 PM

## 2020-06-03 ENCOUNTER — Inpatient Hospital Stay (HOSPITAL_COMMUNITY): Payer: Medicaid Other

## 2020-06-03 ENCOUNTER — Inpatient Hospital Stay: Payer: Self-pay

## 2020-06-03 DIAGNOSIS — Z4682 Encounter for fitting and adjustment of non-vascular catheter: Secondary | ICD-10-CM

## 2020-06-03 DIAGNOSIS — R059 Cough, unspecified: Secondary | ICD-10-CM

## 2020-06-03 DIAGNOSIS — E43 Unspecified severe protein-calorie malnutrition: Secondary | ICD-10-CM | POA: Insufficient documentation

## 2020-06-03 DIAGNOSIS — R0602 Shortness of breath: Secondary | ICD-10-CM

## 2020-06-03 LAB — BASIC METABOLIC PANEL
Anion gap: 7 (ref 5–15)
BUN: 20 mg/dL (ref 6–20)
CO2: 28 mmol/L (ref 22–32)
Calcium: 7.2 mg/dL — ABNORMAL LOW (ref 8.9–10.3)
Chloride: 95 mmol/L — ABNORMAL LOW (ref 98–111)
Creatinine, Ser: 1.08 mg/dL (ref 0.61–1.24)
GFR, Estimated: >60 mL/min (ref >60)
Glucose, Bld: 98 mg/dL (ref 70–99)
Potassium: 3.6 mmol/L (ref 3.5–5.1)
Sodium: 130 mmol/L — ABNORMAL LOW (ref 135–145)

## 2020-06-03 LAB — PHOSPHORUS: Phosphorus: 3.2 mg/dL (ref 2.5–4.6)

## 2020-06-03 LAB — MAGNESIUM: Magnesium: 2.1 mg/dL (ref 1.7–2.4)

## 2020-06-03 MED ORDER — ENSURE ENLIVE PO LIQD
237.0000 mL | Freq: Two times a day (BID) | ORAL | Status: DC
  Administered 2020-06-03 – 2020-06-09 (×7): 237 mL via ORAL

## 2020-06-03 MED ORDER — ADULT MULTIVITAMIN W/MINERALS CH
1.0000 | ORAL_TABLET | Freq: Every day | ORAL | Status: DC
  Administered 2020-06-03 – 2020-06-09 (×7): 1 via ORAL
  Filled 2020-06-03 (×7): qty 1

## 2020-06-03 NOTE — Progress Notes (Signed)
Physical Therapy Treatment Patient Details Name: Edward Mueller MRN: [id number] DOB: December 05, 1970 Today's Date: 06/03/2020    History of Present Illness Pt is a 50 y.o. 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 Chest CT shows multiple peripheral infiltrates consistent with embolic phenomena; concern for endocarditis. Workup for compensated septic shock due to LUE cellulitis, MSSA bactermia of unclear source.  S/p TEE 4/6 with no evidence of endocarditis. UDS (+) THC. Pt with loculated R pleural effusion s/p R chest tube placemtn 4/9. S/p L thoracentesis 4/10. S/p chest tube removal 4/12. No reported PMH; pt reports donating plasma 2x/wk.    PT Comments    Pt continues to have very poor activity tolerance. Dyspneic with minimal activity and fatigues quickly. Pt felt better with support of walker with amb. Will continue to follow to work toward improved activity tolerance.    Follow Up Recommendations  No PT follow up     Equipment Recommendations  Rolling walker with 5" wheels    Recommendations for Other Services       Precautions / Restrictions Precautions Precautions: Fall;Other (comment) Precaution Comments: SOB with minimal activity    Mobility  Bed Mobility Overal bed mobility: Modified Independent Bed Mobility: Supine to Sit;Sit to Supine     Supine to sit: HOB elevated;Modified independent (Device/Increase time) Sit to supine: Modified independent (Device/Increase time);HOB elevated   General bed mobility comments: Incr time and effort    Transfers Overall transfer level: Needs assistance Equipment used: Rolling walker (2 wheeled) Transfers: Sit to/from Stand Sit to Stand: Min guard         General transfer comment: Verbal cues for hand placement. Pt with incr time and effort to rise  Ambulation/Gait Ambulation/Gait assistance:  Supervision Gait Distance (Feet): 60 Feet Assistive device: Rolling walker (2 wheeled) Gait Pattern/deviations: Step-through pattern;Decreased stride length Gait velocity: Decreased Gait velocity interpretation: <1.31 ft/sec, indicative of household ambulator General Gait Details: Assisf for safety and lines. Pt with very slow gait and used rolling walker for support. Distance limited by fatigue and dyspnea.   Stairs             Wheelchair Mobility    Modified Rankin (Stroke Patients Only)       Balance Overall balance assessment: Needs assistance Sitting-balance support: No upper extremity supported;Feet supported Sitting balance-Leahy Scale: Good     Standing balance support: No upper extremity supported;During functional activity Standing balance-Leahy Scale: Fair                              Cognition Arousal/Alertness: Awake/alert Behavior During Therapy: WFL for tasks assessed/performed;Flat affect Overall Cognitive Status: Within Functional Limits for tasks assessed                                        Exercises      General Comments General comments (skin integrity, edema, etc.): On RA pt 95-97% at rest. Unable to get reading during amb but sitting after amb 93%.      Pertinent Vitals/Pain Pain Assessment: Faces Faces Pain Scale: Hurts a little bit Pain Location: Chest tube removal site, chest with coughing Pain Descriptors / Indicators: Guarding;Grimacing Pain Intervention(s): Limited activity within patient's tolerance;Monitored during session    Home Living  Prior Function            PT Goals (current goals can now be found in the care plan section) Acute Rehab PT Goals Patient Stated Goal: Breathe better Progress towards PT goals: Progressing toward goals    Frequency    Min 3X/week      PT Plan Current plan remains appropriate    Co-evaluation               AM-PAC PT "6 Clicks" Mobility   Outcome Measure  Help needed turning from your back to your side while in a flat bed without using bedrails?: None Help needed moving from lying on your back to sitting on the side of a flat bed without using bedrails?: None Help needed moving to and from a bed to a chair (including a wheelchair)?: A Little Help needed standing up from a chair using your arms (e.g., wheelchair or bedside chair)?: A Little Help needed to walk in hospital room?: A Little Help needed climbing 3-5 steps with a railing? : A Little 6 Click Score: 20    End of Session   Activity Tolerance: Patient limited by fatigue Patient left: with call bell/phone within reach;in bed   PT Visit Diagnosis: Other abnormalities of gait and mobility (R26.89);Other (comment)     Time: 9983-3825 PT Time Calculation (min) (ACUTE ONLY): 28 min  Charges:  $Gait Training: 23-37 mins                     [facility] PT Acute Rehabilitation Services Pager [phone] Office [phone]    Angelina Ok [facility] 06/03/2020, 3:54 PM

## 2020-06-03 NOTE — Progress Notes (Signed)
[facility]  Date of Admission:  05/24/2020     Total days of antibiotics 10         ASSESSMENT:  Edward Mueller continues to feel better with continued shortness of breath that is exacerbated by activity. Has remained with low grade temperatures, decreasing leukocytosis, and now on room air.  Sputum culture with normal respiratory flora and pleural fluid without growth to date. Discussed the need for prolonged antibiotic therapy and concern for likely endocarditis. Will continue current dose of cefazolin.   PLAN:  1. Continue current dose of cefazolin.  2. Respiratory care per primary team and CCM.  3. Discussed plan of care for extended course of antibiotics.   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 feeding supplement  237 mL Oral BID BM  . heparin  5,000 Units Subcutaneous Q8H  . multivitamin with minerals  1 tablet Oral Daily  . sodium chloride flush  10 mL Intracatheter Q8H    SUBJECTIVE:  Low grade fever with declining WBC count. No acute events overnight. Feeling better today was able to walk some with physical therapy.   No Known Allergies   Review of Systems: Review of Systems  Constitutional: Negative for chills, fever and weight loss.  Respiratory: Positive for cough and shortness of breath. Negative for wheezing.   Cardiovascular: Negative for chest pain and leg swelling.  Gastrointestinal: Negative for abdominal pain, constipation, diarrhea, nausea and vomiting.  Skin: Negative for rash.      OBJECTIVE: Vitals:   06/03/20 0008 06/03/20 0442 06/03/20 0836 06/03/20 1222  BP: 130/78 129/83 128/73 122/73  Pulse: 91 100 94 97  Resp: 20 18 16 18   Temp: (!) 100.5 F  (38.1 C) 100.1 F (37.8 C) (!) 100.6 F (38.1 C) 99.9 F (37.7 C)  TempSrc: Oral Oral Oral Oral  SpO2: 96% 97% 96% 97%  Weight:      Height:       Body mass index is 19.34 kg/m.  Physical Exam Constitutional:      General: He is not in acute distress.    Appearance: He is well-developed.     Comments: Lying in the bed with head of bed elevated; pleasant.   Cardiovascular:     Rate and Rhythm: Normal rate and regular rhythm.     Heart sounds: Normal heart sounds.  Pulmonary:     Breath sounds: Decreased breath sounds present.  Skin:    General: Skin is warm and dry.  Neurological:     Mental Status: He is alert and oriented to person, place, and time.  Psychiatric:        Behavior: Behavior normal.        Thought Content: Thought content normal.        Judgment: Judgment normal.     Lab Results Lab Results  Component Value Date   WBC 31.5 (H) 06/02/2020   HGB 10.1 (L) 06/02/2020   HCT 28.6 (L) 06/02/2020   MCV 93.8 06/02/2020   PLT 514 (H) 06/02/2020    Lab Results  Component Value Date   CREATININE 1.08 06/03/2020   BUN 20 06/03/2020   NA 130 (L) 06/03/2020  K 3.6 06/03/2020   CL 95 (L) 06/03/2020   CO2 28 06/03/2020    Lab Results  Component Value Date   ALT 26 05/31/2020   AST 25 05/31/2020   ALKPHOS 54 05/31/2020   BILITOT 0.7 05/31/2020     Microbiology: Recent Results (from the past 240 hour(s))  Blood Culture (routine x 2)     Status: Abnormal   Collection Time: 05/24/20  9:45 PM   Specimen: BLOOD LEFT HAND  Result Value Ref Range Status   Specimen Description BLOOD LEFT HAND  Final   Special Requests   Final    BOTTLES DRAWN AEROBIC AND ANAEROBIC Blood Culture results may not be optimal due to an inadequate volume of blood received in culture bottles   Culture  Setup Time   Final    GRAM POSITIVE COCCI IN CLUSTERS IN BOTH AEROBIC AND ANAEROBIC BOTTLES CRITICAL VALUE NOTED.  VALUE IS CONSISTENT WITH PREVIOUSLY REPORTED AND CALLED VALUE.     Culture (A)  Final    STAPHYLOCOCCUS AUREUS SUSCEPTIBILITIES PERFORMED ON PREVIOUS CULTURE WITHIN THE LAST 5 DAYS. Performed at [facility], [address] [place], [place] [address]    Report Status 05/27/2020 FINAL  Final  MRSA PCR Screening     Status: None   Collection Time: 05/24/20 11:46 PM   Specimen: Nasopharyngeal  Result Value Ref Range Status   MRSA by PCR NEGATIVE NEGATIVE Final    Comment:        The GeneXpert MRSA Assay (FDA approved for NASAL specimens only), is one component of a comprehensive MRSA colonization surveillance program. It is not intended to diagnose MRSA infection nor to guide or monitor treatment for MRSA infections. Performed at [facility], [address], [place], [place] [address]   Urine culture     Status: None   Collection Time: 05/24/20 11:59 PM   Specimen: In/Out Cath Urine  Result Value Ref Range Status   Specimen Description IN/OUT CATH URINE  Final   Special Requests NONE  Final   Culture   Final    NO GROWTH Performed at [facility], [address], [place], [place] [address]    Report Status 05/26/2020 FINAL  Final  Culture, blood (routine x 2)     Status: None   Collection Time: 05/25/20 10:19 PM   Specimen: BLOOD LEFT HAND  Result Value Ref Range Status   Specimen Description BLOOD LEFT HAND  Final   Special Requests   Final    BOTTLES DRAWN AEROBIC AND ANAEROBIC Blood Culture adequate volume   Culture   Final    NO GROWTH 5 DAYS Performed at [facility], [address] [place], [place] [address]    Report Status 05/30/2020 FINAL  Final  Culture, blood (routine x 2)     Status: None   Collection Time: 05/25/20 10:32 PM   Specimen: BLOOD RIGHT HAND  Result Value Ref Range Status   Specimen Description BLOOD RIGHT HAND  Final   Special Requests AEROBIC BOTTLE ONLY Blood Culture adequate volume  Final   Culture   Final    NO GROWTH 5 DAYS Performed at [facility], [address], [place], [place] [address]    Report Status 05/30/2020 FINAL  Final  Expectorated Sputum Assessment w Gram Stain, Rflx to Resp Cult     Status: None   Collection Time: 05/29/20  7:11 AM   Specimen: Expectorated Sputum  Result Value  Ref Range Status   Specimen Description EXPECTORATED SPUTUM  Final   Special Requests Normal  Final   Sputum evaluation   Final    THIS SPECIMEN IS ACCEPTABLE FOR SPUTUM CULTURE Performed at [facility], [address] [place], [place] [address]    Report Status 05/29/2020 FINAL  Final  Culture, Respiratory w Gram Stain     Status: None   Collection Time: 05/29/20  7:11 AM  Result Value Ref Range Status   Specimen Description EXPECTORATED SPUTUM  Final   Special Requests Normal Reflexed from [id number]  Final   Gram Stain   Final    ABUNDANT WBC PRESENT,BOTH PMN AND MONONUCLEAR FEW SQUAMOUS EPITHELIAL CELLS PRESENT ABUNDANT GRAM NEGATIVE RODS ABUNDANT GRAM POSITIVE COCCI FEW YEAST    Culture   Final    FEW Consistent with normal respiratory flora. No Pseudomonas species isolated Performed at [facility], [address] [place], [place] [address]    Report Status 06/01/2020 FINAL  Final  Culture, blood (routine x 2)     Status: None (Preliminary result)   Collection Time: 05/31/20  8:07 AM   Specimen: BLOOD LEFT HAND  Result Value Ref Range Status   Specimen Description BLOOD LEFT HAND  Final   Special Requests   Final    BOTTLES DRAWN AEROBIC AND ANAEROBIC Blood Culture adequate volume   Culture   Final    NO GROWTH 3 DAYS Performed at [facility], [address], [place], [place] [address]    Report Status PENDING  Incomplete  Culture, blood (routine x 2)     Status: None (Preliminary result)   Collection Time: 05/31/20  8:12 AM   Specimen: BLOOD RIGHT HAND  Result Value Ref Range Status   Specimen Description BLOOD RIGHT HAND  Final   Special Requests   Final    BOTTLES DRAWN AEROBIC ONLY Blood Culture results may not  be optimal due to an inadequate volume of blood received in culture bottles   Culture   Final    NO GROWTH 3 DAYS Performed at [facility], [address], [place], [place] [address]    Report Status PENDING  Incomplete  Gram stain     Status: None   Collection Time: 05/31/20 11:52 AM   Specimen: Fluid  Result Value Ref Range Status   Specimen Description FLUID  Final   Special Requests PLEURAL FLUID LEFT  Final   Gram Stain   Final    FEW WBC PRESENT, PREDOMINANTLY PMN NO ORGANISMS SEEN Performed at [facility], [address], [place], [place] [address]    Report Status 05/31/2020 FINAL  Final  Culture, body fluid w Gram Stain-bottle     Status: None (Preliminary result)   Collection Time: 05/31/20 11:52 AM   Specimen: Fluid  Result Value Ref Range Status   Specimen Description FLUID  Final   Special Requests PLEURAL FLUID LEFT  Final   Culture   Final    NO GROWTH 3 DAYS Performed at [facility], [address] [place], [place] [address]    Report Status PENDING  Incomplete     Marcos Eke, NP [facility]  06/03/2020  4:09 PM

## 2020-06-03 NOTE — Progress Notes (Signed)
Progress Note    Edward Mueller   ZOX:[id number]  DOB: 11-Jul-1970  DOA: 05/24/2020     10  PCP: Pcp, No  CC: "feeling sick"  Hospital Course: 50 year old gentleman with no reported medical problems who donates plasma 2 times a week, donated plasma on 05/22/20 and after about 24 hours he started not feeling well.  Reported symptoms included generalized malaise, nausea, vomiting, shortness of breath, pleuritic chest pain, cough with blood-tinged sputum. Came to the emergency room on 05/24/20, was febrile, tachypneic with lactic acidosis.  Found to have multilobular pneumonia on chest x-ray, left upper extremity cellulitis and left acute superficial vein thrombosis involving the left cephalic vein on 09/21/1912.  Was started on IV antibiotics and IV fluids and admitted to ICU for closer monitoring then later transferred to progressive care unit.  Work-up revealed MSSA bacteremia for which infectious disease was consulted.  No vegetation was seen on TEE on 05/27/2020.  Sputum culture from 05/29/2020 growing abundant gram-negative rods, abundant gram-positive cocci with few yeast, culture was reincubated for better growth.  He has been on cefazolin for MSSA bacteremia as recommended by infectious disease.  Repeated CT chest without contrast done on 05/30/2020 showed interval worsening of the aeration of the lungs.  IR was consulted for thoracentesis and chest tube placement due to loculated pleural effusion.   Post right thoracentesis by IR on 05/30/2020 with approximately 300 cc translucent, serosanguineous fluid removed and right chest tube placement.  Post left thoracentesis by IR on 05/31/2020 with 550 cc dark red fluid removed.  Chest tube is managed by PCCM.  Right-sided chest tube was removed on 06/02/2020 with no reaccumulation of fluid.   Interval History:  No events overnight. Denies CP, SOB, fevers, chills. Mild cough noted.   ROS: Constitutional: negative for chills and fevers, Respiratory:  negative for sputum and wheezing, Cardiovascular: negative for chest pain and Gastrointestinal: negative for abdominal pain  Assessment & Plan: Severe sepsis, improving, secondary to LUE cellulitis(now resolved), MSSA bacteremia, multifocal pneumonia and septic pulmonary emboli, all POA Endocarditis  Clinically improving. 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 No evidence of valvular vegetation on TEE. Appreciate infectious disease and PCCM assistance. He is currently on IV cefazolin, continue as recommended by infectious disease. Repeated blood culture from 05/25/2020, negative final. Sputum culture obtained on 05/29/2020 revealed few consistent with normal respiratory flora, no Pseudomonas species isolated.   Blood culture repeated on 05/31/2020 peripherally negative to date. WBC is downtrending, procalcitonin is also downtrending. - plan is for 6 week course Ancef   Acute hypoxic respiratory failure secondary to multifocal pneumonia, loculated right parapneumonic effusion, simple left parapneumonic effusion, and extensive bilateral septic pulmonary emboli D-dimer on presentation greater than 4, repeated 2.83 on 06/02/2020. On IV antibiotics as stated above. Post left and right thoracentesis and right chest tube placement by IR. -Right chest tube removed on 06/02/2020.  No significant reaccumulation on CXR obtained on 06/03/2020 Continue to maintain O2 saturation greater than 90%. Wean off oxygen supplementation as tolerated.  Loculated right parapneumonic effusion, simple left parapneumonic effusion, post right thoracentesis and right thoracostomy tube placement by IR on 05/30/2020, post left thoracentesis on 05/31/2020 by IR. - s/p right chest tube removed 06/02/20  Severe protein calorie malnutrition due to severe, critical illness Albumin 1.4 on 05/31/2020 BMI 19 Dietitian consulted for assessment of nutrition requirements Continue to encourage increase in oral protein calorie  intake.  Resolved post repletion.  Hypocalcemia Calcium corrected for albumin 9.5.  Resolved Non anion gap metabolic acidosis  AKI  on CKD 3B, improving. Presented with creatinine of 2.7 Creatinine downtrending 1.1 with GFR greater than 60. Continue to monitor urine output Repeat renal panel in the morning.  Hypovolemic hyponatremia Encourage increase in oral intake - stable   Repeat BMP in the morning  Hypokalemia Repleted Repeat BMP in the morning  Hypophosphatemia - repleted   Resolved left upper extremity cellulitis Initially presented with left upper extremity cellulitis which has been treated and has now resolved  Left upper extremity acute superficial vein thrombosis Continue to monitor  THC/tobacco use UDS positive for THC on 05/24/2020 Denies use of cocaine or intravenous drug abuse Cessation counseling when hemodynamically stable.  Generalized weakness in the setting of severe, critical illness Out of bed to chair with every shift as tolerated Ambulate patient with every shift as tolerated Increase oral protein calorie intake as tolerated PT assessed with no further recommendations. Continue fall precautions.  Old records reviewed in assessment of this patient  Antimicrobials: Ancef 05/25/20>>current   DVT prophylaxis: heparin injection 5,000 Units Start: 05/24/20 2330 SCDs Start: 05/24/20 2315   Code Status:   Code Status: Full Code Family Communication:   Disposition Plan: Status is: Inpatient  Remains inpatient appropriate because:IV treatments appropriate due to intensity of illness or inability to take PO and Inpatient level of care appropriate due to severity of illness   Dispo:  Patient From: Home  Planned Disposition: Home  Medically stable for discharge: No        Risk of unplanned readmission score: Unplanned Admission- Pilot do not use: 11.8   Objective: Blood pressure 133/82, pulse 96, temperature (!) 101 F (38.3  C), temperature source Oral, resp. rate 16, height 6\' 3"  (1.905 m), weight 70.2 kg, SpO2 97 %.  Examination: General appearance: alert, cooperative and no distress Head: Normocephalic, without obvious abnormality, atraumatic Eyes: EOMI Back: right chest tube site sore to palpation but no swelling or surrounding signs of infection Lungs: decreased right sided breath sounds, no wheezing Heart: regular rate and rhythm and S1, S2 normal Abdomen: normal findings: bowel sounds normal and soft, non-tender Extremities: no edema Skin: mobility and turgor normal Neurologic: Grossly normal  Consultants:   ID  Radiology   Pulm  Procedures:   2D echo on 05/25/2020  TEE on 05/27/2020.  Chest tube placement, right, 05/30/20  Right chest tube removal on 06/02/2020.  Data Reviewed: I have personally reviewed following labs and imaging studies Results for orders placed or performed during the hospital encounter of 05/24/20 (from the past 24 hour(s))  Basic metabolic panel     Status: Abnormal   Collection Time: 06/03/20  1:45 AM  Result Value Ref Range   Sodium 130 (L) 135 - 145 mmol/L   Potassium 3.6 3.5 - 5.1 mmol/L   Chloride 95 (L) 98 - 111 mmol/L   CO2 28 22 - 32 mmol/L   Glucose, Bld 98 70 - 99 mg/dL   BUN 20 6 - 20 mg/dL   Creatinine, Ser 06/05/20 0.61 - 1.24 mg/dL   Calcium 7.2 (L) 8.9 - 10.3 mg/dL   GFR, Estimated 9.23 >30 mL/min   Anion gap 7 5 - 15  Magnesium     Status: None   Collection Time: 06/03/20  1:45 AM  Result Value Ref Range   Magnesium 2.1 1.7 - 2.4 mg/dL  Phosphorus     Status: None   Collection Time: 06/03/20  1:45 AM  Result Value Ref Range   Phosphorus 3.2 2.5 - 4.6 mg/dL  Recent Results (from the past 240 hour(s))  Blood Culture (routine x 2)     Status: Abnormal   Collection Time: 05/24/20  9:45 PM   Specimen: BLOOD LEFT HAND  Result Value Ref Range Status   Specimen Description BLOOD LEFT HAND  Final   Special Requests   Final    BOTTLES DRAWN AEROBIC  AND ANAEROBIC Blood Culture results may not be optimal due to an inadequate volume of blood received in culture bottles   Culture  Setup Time   Final    GRAM POSITIVE COCCI IN CLUSTERS IN BOTH AEROBIC AND ANAEROBIC BOTTLES CRITICAL VALUE NOTED.  VALUE IS CONSISTENT WITH PREVIOUSLY REPORTED AND CALLED VALUE.    Culture (A)  Final    STAPHYLOCOCCUS AUREUS SUSCEPTIBILITIES PERFORMED ON PREVIOUS CULTURE WITHIN THE LAST 5 DAYS. Performed at [facility], [address], [place], [place] [address]    Report Status 05/27/2020 FINAL  Final  MRSA PCR Screening     Status: None   Collection Time: 05/24/20 11:46 PM   Specimen: Nasopharyngeal  Result Value Ref Range Status   MRSA by PCR NEGATIVE NEGATIVE Final    Comment:        The GeneXpert MRSA Assay (FDA approved for NASAL specimens only), is one component of a comprehensive MRSA colonization surveillance program. It is not intended to diagnose MRSA infection nor to guide or monitor treatment for MRSA infections. Performed at [facility], [address] [place], [place] [address]   Urine culture     Status: None   Collection Time: 05/24/20 11:59 PM   Specimen: In/Out Cath Urine  Result Value Ref Range Status   Specimen Description IN/OUT CATH URINE  Final   Special Requests NONE  Final   Culture   Final    NO GROWTH Performed at [facility], [address], [place], [place] [address]    Report Status 05/26/2020 FINAL  Final  Culture, blood (routine x 2)     Status: None   Collection Time: 05/25/20 10:19 PM   Specimen: BLOOD LEFT HAND  Result Value Ref Range Status   Specimen Description BLOOD LEFT HAND  Final   Special Requests   Final    BOTTLES DRAWN AEROBIC AND ANAEROBIC Blood Culture adequate volume   Culture   Final    NO GROWTH 5 DAYS Performed at [facility], [address], [place], [place] [address]    Report Status 05/30/2020 FINAL  Final  Culture, blood (routine x 2)     Status: None    Collection Time: 05/25/20 10:32 PM   Specimen: BLOOD RIGHT HAND  Result Value Ref Range Status   Specimen Description BLOOD RIGHT HAND  Final   Special Requests AEROBIC BOTTLE ONLY Blood Culture adequate volume  Final   Culture   Final    NO GROWTH 5 DAYS Performed at [facility], [address], [place], [place] [address]    Report Status 05/30/2020 FINAL  Final  Expectorated Sputum Assessment w Gram Stain, Rflx to Resp Cult     Status: None   Collection Time: 05/29/20  7:11 AM   Specimen: Expectorated Sputum  Result Value Ref Range Status   Specimen Description EXPECTORATED SPUTUM  Final   Special Requests Normal  Final   Sputum evaluation   Final    THIS SPECIMEN IS ACCEPTABLE FOR SPUTUM CULTURE Performed at [facility], [address], [place], [place] [address]  Report Status 05/29/2020 FINAL  Final  Culture, Respiratory w Gram Stain     Status: None   Collection Time: 05/29/20  7:11 AM  Result Value Ref Range Status   Specimen Description EXPECTORATED SPUTUM  Final   Special Requests Normal Reflexed from [id number]  Final   Gram Stain   Final    ABUNDANT WBC PRESENT,BOTH PMN AND MONONUCLEAR FEW SQUAMOUS EPITHELIAL CELLS PRESENT ABUNDANT GRAM NEGATIVE RODS ABUNDANT GRAM POSITIVE COCCI FEW YEAST    Culture   Final    FEW Consistent with normal respiratory flora. No Pseudomonas species isolated Performed at [facility], [address] [place], [place] [address]    Report Status 06/01/2020 FINAL  Final  Culture, blood (routine x 2)     Status: None (Preliminary result)   Collection Time: 05/31/20  8:07 AM   Specimen: BLOOD LEFT HAND  Result Value Ref Range Status   Specimen Description BLOOD LEFT HAND  Final   Special Requests   Final    BOTTLES DRAWN AEROBIC AND ANAEROBIC Blood Culture adequate volume   Culture   Final    NO GROWTH 3 DAYS Performed at [facility], [address] [place], [place] [address]    Report Status PENDING   Incomplete  Culture, blood (routine x 2)     Status: None (Preliminary result)   Collection Time: 05/31/20  8:12 AM   Specimen: BLOOD RIGHT HAND  Result Value Ref Range Status   Specimen Description BLOOD RIGHT HAND  Final   Special Requests   Final    BOTTLES DRAWN AEROBIC ONLY Blood Culture results may not be optimal due to an inadequate volume of blood received in culture bottles   Culture   Final    NO GROWTH 3 DAYS Performed at [facility], [address] [place], [place] [address]    Report Status PENDING  Incomplete  Gram stain     Status: None   Collection Time: 05/31/20 11:52 AM   Specimen: Fluid  Result Value Ref Range Status   Specimen Description FLUID  Final   Special Requests PLEURAL FLUID LEFT  Final   Gram Stain   Final    FEW WBC PRESENT, PREDOMINANTLY PMN NO ORGANISMS SEEN Performed at [facility], [address] [place], [place] [address]    Report Status 05/31/2020 FINAL  Final  Culture, body fluid w Gram Stain-bottle     Status: None (Preliminary result)   Collection Time: 05/31/20 11:52 AM   Specimen: Fluid  Result Value Ref Range Status   Specimen Description FLUID  Final   Special Requests PLEURAL FLUID LEFT  Final   Culture   Final    NO GROWTH 3 DAYS Performed at [facility], [address] [place], [place] [address]    Report Status PENDING  Incomplete     Radiology Studies: DG CHEST PORT 1 VIEW  Result Date: 06/03/2020 CLINICAL DATA:  Left pleural effusion EXAM: PORTABLE CHEST 1 VIEW COMPARISON:  06/02/2020 FINDINGS: bilateral airspace disease and cavitary lesions again noted, left greater than right, unchanged since prior study. Small bilateral pleural effusions. No pneumothorax. Heart is normal size. IMPRESSION: Small bilateral pleural effusions with bilateral cavitary airspace disease, not significantly changed. Electronically Signed   By: Charlett NoseKevin  Dover M.D.   On: 06/03/2020 08:06   DG Chest Port 1 View  Result Date:  06/02/2020 CLINICAL DATA:  Shortness of breath. EXAM: PORTABLE CHEST 1 VIEW COMPARISON:  Radiograph of same day.  CT of May 31, 2020. FINDINGS: The heart size and mediastinal contours are within normal limits. No definite pneumothorax is noted. Stable bilateral lung opacities are noted, left greater than right with associated pleural effusions most consistent with pneumonia. The visualized skeletal structures are unremarkable. IMPRESSION: Stable bilateral lung opacities are noted most consistent with pneumonia with small associated pleural effusions. Electronically Signed   By: Lupita Raider M.D.   On: 06/02/2020 13:57   DG CHEST PORT 1 VIEW  Result Date: 06/02/2020 CLINICAL DATA:  Shortness of breath, chest tube EXAM: PORTABLE CHEST 1 VIEW COMPARISON:  05/31/2020 FINDINGS: Right basilar chest tube remains in place, unchanged. No pneumothorax. Patchy bilateral airspace disease, worsening on the left since prior study. Heart is normal size. Small bilateral effusions. No acute bony abnormality. IMPRESSION: No pneumothorax. Patchy bilateral airspace disease, worsening on the left. Small effusions. Electronically Signed   By: Charlett Nose M.D.   On: 06/02/2020 08:10   [place] EKG SITE RITE  Result Date: 06/03/2020 If Site Rite image not attached, placement could not be confirmed due to current cardiac rhythm.  [place] EKG SITE RITE  Final Result    DG CHEST PORT 1 VIEW  Final Result    DG Chest Port 1 View  Final Result    DG CHEST PORT 1 VIEW  Final Result    CT CHEST WO CONTRAST  Final Result    DG Chest 1 View  Final Result    US THORACENTESIS ASP PLEURAL SPACE W/IMG GUIDE  Final Result    DG CHEST PORT 1 VIEW  Final Result    DG CHEST PORT 1 VIEW  Final Result    US THORACENTESIS ASP PLEURAL SPACE W/IMG GUIDE  Final Result    CT CHEST WO CONTRAST  Final Result    DG CHEST PORT 1 VIEW  Final Result    CT HEAD WO CONTRAST  Final Result    US RENAL  Final Result    DG CHEST  PORT 1 VIEW  Final Result    VAS [place] UPPER EXTREMITY VENOUS DUPLEX  Final Result    CT Chest Wo Contrast  Final Result    DG Chest Port 1 View  Final Result    DG CHEST PORT 1 VIEW    (Results Pending)    Scheduled Meds: . feeding supplement  237 mL Oral BID BM  . heparin  5,000 Units Subcutaneous Q8H  . multivitamin with minerals  1 tablet Oral Daily  . sodium chloride flush  10 mL Intracatheter Q8H   PRN Meds: acetaminophen, albuterol, docusate sodium, oxyCODONE, polyethylene glycol Continuous Infusions: .  ceFAZolin (ANCEF) IV 2 g (06/03/20 1400)     LOS: 10 days  Time spent: Greater than 50% of the 35 minute visit was spent in counseling/coordination of care for the patient as laid out in the A&P.   Lewie Chamber, MD [facility] 06/03/2020, 4:58 PM

## 2020-06-03 NOTE — Progress Notes (Signed)
Initial Nutrition Assessment  DOCUMENTATION CODES:  Severe malnutrition in context of social or environmental circumstances  INTERVENTION:  Ensure Enlive po BID, each supplement provides 350 kcal and 20 grams of protein  MVI with minerals daily  NUTRITION DIAGNOSIS:  Severe Malnutrition related to social / environmental circumstances as evidenced by severe fat depletion,severe muscle depletion.  GOAL:  Patient will meet greater than or equal to 90% of their needs  MONITOR:  PO intake,Supplement acceptance  REASON FOR ASSESSMENT:  Consult Assessment of nutrition requirement/status  ASSESSMENT:  Pt admitted with chest pain and arm pain. Pt reportedly donates plasma 2x/w and started feeling unwell about 24 hours after his most recent donation. In ED, pt found to have tachypnea with elevated lactic acid. Dx with bacteremia.  Pt resting in bed at the time of visit. Pt getting SOB while talking, states that he has been feeling weak and SOB for quite awhile at home. Inquired about intake, pt reports that he normally eats 2 meals at home. Thinks appetite is about the same since admission. Noted 40% average intake x 3 recorded meals this admission. Discussed intake with RN, states that he has eaten everything he has ordered today. Muscle and fat deficits present on exam. Underwent TEE 4/6 with no acute findings.   4/10 - thoracentesis, removed   Medications reviewed  Labs reviewed: - Na 130  NUTRITION - FOCUSED PHYSICAL EXAM: Flowsheet Row Most Recent Value  Orbital Region Mild depletion  Upper Arm Region Severe depletion  Thoracic and Lumbar Region Severe depletion  Buccal Region Mild depletion  Temple Region Mild depletion  Clavicle Bone Region Severe depletion  Clavicle and Acromion Bone Region Severe depletion  Scapular Bone Region Severe depletion  Dorsal Hand Moderate depletion  Patellar Region Severe depletion  Anterior Thigh Region Severe depletion  Posterior Calf  Region Severe depletion  Edema (RD Assessment) None  Hair Reviewed  Eyes Reviewed  Mouth Reviewed  Skin Reviewed  Nails Reviewed     Diet Order:   Diet Order            Diet regular Room service appropriate? Yes; Fluid consistency: Thin  Diet effective now                EDUCATION NEEDS:  No education needs have been identified at this time  Skin:  Skin Assessment: Reviewed RN Assessment  Last BM:  4/10 - per RN documentation  Height:  Ht Readings from Last 1 Encounters:  05/24/20 6\' 3"  (1.905 m)   Weight:  Wt Readings from Last 1 Encounters:  06/01/20 70.2 kg   Ideal Body Weight:  89.1 kg  BMI:  Body mass index is 19.34 kg/m.  Estimated Nutritional Needs:   Kcal:  2200-2400 kcal  Protein:  110-120  Fluid:  2.2L/d   08/01/20, RD, LDN Clinical Dietitian Pager on Amion

## 2020-06-03 NOTE — Progress Notes (Signed)
NAME:  Edward Mueller, MRN:  [id number], DOB:  1970-12-21, LOS: 10 ADMISSION DATE:  05/24/2020, CONSULTATION DATE:  05/24/2020 REFERRING MD:  Lockie Mola, CHIEF COMPLAINT:  Dyspnea   History of Present Illness:  50 year old man who presented to [facility] on 4/3 with c/o dyspnea.    Reportedly was in his usual state of health until 48 hours prior to admit. 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  On 4/1 he donated plasma.  After that he woke with a "knot on his arm" and felt poorly.  He had malaise, N/V and SOB with associated central chest pain.  He began coughing up bloody sputum.  He was found to have sepsis in the setting of left arm cellulitis, extensive PNA, MSSA bacteremia & AKI.  TEE negative for endocarditis.   Now s/p R chest tube and L thoracentesis performed by IR.   Pertinent  Medical History  Tobacco / THC use  Significant Hospital Events: Including procedures, antibiotic start and stop dates in addition to other pertinent events    4/3 Admit with dyspnea, febrile with elevated lactate, imaging consistent with PNA, L arm cellulitis. Negative HIV, COVID, Flu. Cultures positive for MSSA.   4/4 To [facility]   4/6 TEE negative for vegetation   4/7 PCCM called back for increased O2 needs. O2 weaned to 3L on exam, sats 97%. VBG 7.4 / 42s  4/9 chest CT revealed bilateral pleural effusions with appearance of loculation, IR consulted and placed right pigtail chest tube patient received first dose of TPA/dornase  4/10  2L removed via chest tube since insertion.  Second dose TPA dornase administered  4/10 L thoracentesis performed by IR yielding 550 mL dark red fluid, GS neg, pleural cx pending  4/11 another chest tube drainage, WBC trending 44->31.5  4/12 right chest tube removed due to low output, PCT 7.37-> 2.85   Interim History / Subjective:  tmax 100.9 overnight  No complaints at rest, reported some exertional dyspnea  yesterday with PT, improved with rest.   Objective   Blood pressure 129/83, pulse 100, temperature 100.1 F (37.8 C), temperature source Oral, resp. rate 18, height 6\' 3"  (1.905 m), weight 70.2 kg, SpO2 97 %.        Intake/Output Summary (Last 24 hours) at 06/03/2020 0733 Last data filed at 06/03/2020 06/05/2020 Gross per 24 hour  Intake 100 ml  Output 1200 ml  Net -1100 ml   Filed Weights   05/24/20 1410 05/29/20 0500 06/01/20 0405  Weight: 68 kg 68.9 kg 70.2 kg    Examination: General:  Chronically ill, thin appearing male sitting in bed in NAD HEENT: MM pink/moist Neuro: AOx4, MAE CV: rr, NSR PULM:  Normal respirations, speaking in full sentences, clear and diminished in bases, ongoing productive cough unchanged in amount or character  (unable to see, spits in a cup appears- frothy/ blood tinged/tan GI: soft, bs active, NT  Extremities: warm/dry, no LE edema  Skin: no rashes    Labs/imaging that I havepersonally reviewed  (right click and "Reselect all SmartList Selections" daily)  CXR personally reviewed 4/13 -> right CT since removed, stable from yesterday afternoon, no significant re-accumulate, effusions stable   No CBC today  Sodium 129-> 130 BUN 29-> 20 Creatinine 1.16-> 1.08  Left pleural fluid: GS neg, cx-> ngtd x 2   Resolved Hospital Problem list   Septic shock  Assessment & Plan:  Cavitary  pneumonia from likely tricuspid endocarditis  Loculated R parapneumonic effusion, simple left parapneumonic effusion  Acute respiratory failure with hypoxia  - CXR today with no significant re-accumulation; effusions stable - follow left pleural effusion thora culture 4/10 - appreciate ID input.  Cefazolin per ID recs, likely 6 week course - trending WBC/ fever curve  - ongoing aggressive pulm hygiene with IS/ PT   MSSA bacteremia Per primary. - IV abx as above   Best practice (right click and "Reselect all SmartList Selections" daily)  Per primary  Labs    CBC: Recent Labs  Lab 05/28/20 0255 05/29/20 0308 05/30/20 0209 05/31/20 0124 06/01/20 0239 06/02/20 0422  WBC 16.4* 25.0* 34.8* 45.1* 44.2* 31.5*  NEUTROABS 12.9*  --   --  37.6* 38.3* 27.7*  HGB 12.5* 11.9* 12.5* 12.6* 11.6* 10.1*  HCT 35.8* 33.8* 36.5* 36.0* 33.9* 28.6*  MCV 92.5 92.6 94.6 94.2 93.1 93.8  PLT PLATELET CLUMPS NOTED ON SMEAR, UNABLE TO ESTIMATE 140* 194 334 422* 514*    Basic Metabolic Panel: Recent Labs  Lab 05/28/20 0255 05/29/20 0308 05/30/20 0209 05/31/20 0124 06/01/20 0239 06/02/20 0422 06/03/20 0145  NA 135 134* 136 135 131* 129* 130*  K 3.7 3.4* 3.5 3.7 3.7 3.7 3.6  CL 102 101 102 99 98 94* 95*  CO2 21* 26 28 28 27 27 28   GLUCOSE 111* 122* 111* 132* 123* 104* 98  BUN 48* 44* 38* 37* 39* 29* 20  CREATININE 1.50* 1.51* 1.41* 1.46* 1.38* 1.16 1.08  CALCIUM 7.4* 7.4* 7.4* 7.4* 7.1* 7.1* 7.2*  MG 3.0* 2.8* 2.7*  --   --  2.1 2.1  PHOS 2.5 2.5 2.4*  --   --  2.9 3.2   GFR: Estimated Creatinine Clearance: 80.3 mL/min (by C-G formula based on SCr of 1.08 mg/dL). Recent Labs  Lab 05/28/20 0255 05/28/20 0631 05/29/20 0308 05/30/20 0209 05/31/20 0124 06/01/20 0239 06/02/20 0422  PROCALCITON  --  31.08 20.43  --  7.37  --  2.85  WBC 16.4*  --  25.0* 34.8* 45.1* 44.2* 31.5*  LATICACIDVEN 1.0  --   --   --   --   --   --       08/02/20, ACNP [facility] 06/03/2020, 9:09 AM

## 2020-06-04 DIAGNOSIS — I76 Septic arterial embolism: Secondary | ICD-10-CM

## 2020-06-04 DIAGNOSIS — I330 Acute and subacute infective endocarditis: Secondary | ICD-10-CM

## 2020-06-04 LAB — BASIC METABOLIC PANEL
Anion gap: 9 (ref 5–15)
BUN: 16 mg/dL (ref 6–20)
CO2: 26 mmol/L (ref 22–32)
Calcium: 7.3 mg/dL — ABNORMAL LOW (ref 8.9–10.3)
Chloride: 99 mmol/L (ref 98–111)
Creatinine, Ser: 1 mg/dL (ref 0.61–1.24)
GFR, Estimated: >60 mL/min (ref >60)
Glucose, Bld: 103 mg/dL — ABNORMAL HIGH (ref 70–99)
Potassium: 3.9 mmol/L (ref 3.5–5.1)
Sodium: 134 mmol/L — ABNORMAL LOW (ref 135–145)

## 2020-06-04 LAB — MAGNESIUM: Magnesium: 1.9 mg/dL (ref 1.7–2.4)

## 2020-06-04 LAB — CBC
HCT: 26.8 % — ABNORMAL LOW (ref 39.0–52.0)
Hemoglobin: 9.2 g/dL — ABNORMAL LOW (ref 13.0–17.0)
MCH: 32.4 pg (ref 26.0–34.0)
MCHC: 34.3 g/dL (ref 30.0–36.0)
MCV: 94.4 fL (ref 80.0–100.0)
Platelets: 693 10*3/uL — ABNORMAL HIGH (ref 150–400)
RBC: 2.84 MIL/uL — ABNORMAL LOW (ref 4.22–5.81)
RDW: 13.2 % (ref 11.5–15.5)
WBC: 23.2 10*3/uL — ABNORMAL HIGH (ref 4.0–10.5)
nRBC: 0 % (ref 0.0–0.2)

## 2020-06-04 MED ORDER — SODIUM CHLORIDE 0.9% FLUSH: 10.0000 mL | INTRAVENOUS | Status: DC | PRN

## 2020-06-04 MED ORDER — CHLORHEXIDINE GLUCONATE CLOTH 2 % EX PADS
6.0000 | MEDICATED_PAD | Freq: Every day | CUTANEOUS | Status: DC
  Administered 2020-06-04 – 2020-06-09 (×6): 6 via TOPICAL

## 2020-06-04 MED ORDER — SODIUM CHLORIDE 0.9% FLUSH
10.0000 mL | Freq: Two times a day (BID) | INTRAVENOUS | Status: DC
  Administered 2020-06-04 – 2020-06-09 (×5): 10 mL

## 2020-06-04 NOTE — Progress Notes (Signed)
Pt's temperature taken orally 102.5 F. Loney Loh, MD made aware and PRN Tylenol administered. Will continue to monitor.   Bari Edward, RN

## 2020-06-04 NOTE — Plan of Care (Signed)
  Problem: Clinical Measurements: Goal: Ability to maintain clinical measurements within normal limits will improve Outcome: Progressing   

## 2020-06-04 NOTE — Progress Notes (Signed)
Physical Therapy Treatment Patient Details Name: Edward Mueller MRN: [id number] DOB: June 11, 1970 Today's Date: 06/04/2020    History of Present Illness Pt is a 50 y.o. 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 Chest CT shows multiple peripheral infiltrates consistent with embolic phenomena; concern for endocarditis. Workup for compensated septic shock due to LUE cellulitis, MSSA bactermia of unclear source.  S/p TEE 4/6 with no evidence of endocarditis. UDS (+) THC. Pt with loculated R pleural effusion s/p R chest tube placemtn 4/9. S/p L thoracentesis 4/10. S/p chest tube removal 4/12. No reported PMH; pt reports donating plasma 2x/wk.    PT Comments    Pt remains limited by SOB, increased RR, and poor activity tolerance. Pt is only able to ambulate 3-4' at a time this session before taking standing rest breaks. Pt requires PT assistance for safety due to fatigue and with increased weakness, requiring physical assistance for bed mobility and transfers at this time. Pt reports only ambulating 3 times since admission, ideally pt would be ambulating daily, progressing to multiple times a day to improve activity tolerance. PT now recommends HHPT services to continue progression of activity tolerance.   Follow Up Recommendations  Home health PT     Equipment Recommendations  Rolling walker with 5" wheels    Recommendations for Other Services       Precautions / Restrictions Precautions Precautions: Fall;Other (comment) Precaution Comments: SOB with minimal activity, monitor SpO2 Restrictions Weight Bearing Restrictions: No    Mobility  Bed Mobility Overal bed mobility: Needs Assistance Bed Mobility: Supine to Sit;Sit to Supine     Supine to sit: Min assist;HOB elevated (PT UE support to pull) Sit to supine: Modified independent (Device/Increase time);HOB elevated         Transfers Overall transfer level: Needs assistance Equipment used: Rolling walker (2 wheeled) Transfers: Sit to/from Stand Sit to Stand: Min assist         General transfer comment: minA to power up due to LE power deficits  Ambulation/Gait Ambulation/Gait assistance: Min guard Gait Distance (Feet): 60 Feet Assistive device: Rolling walker (2 wheeled) Gait Pattern/deviations: Step-to pattern Gait velocity: Decreased Gait velocity interpretation: <1.31 ft/sec, indicative of household ambulator General Gait Details: pt with slowed step-to gait, reduced foot clearance, mobilizing for 3-4' at a time and stopping to catch breath   Stairs             Wheelchair Mobility    Modified Rankin (Stroke Patients Only)       Balance Overall balance assessment: Needs assistance Sitting-balance support: No upper extremity supported;Feet supported Sitting balance-Leahy Scale: Good     Standing balance support: Single extremity supported;Bilateral upper extremity supported Standing balance-Leahy Scale: Poor                              Cognition Arousal/Alertness: Awake/alert Behavior During Therapy: WFL for tasks assessed/performed;Flat affect Overall Cognitive Status: Within Functional Limits for tasks assessed                                        Exercises Other Exercises Other Exercises: PT provides reinforcement of use of incentive spirometer and flutter valve, also encouragement for upright positioning and increased time out of bed    General Comments General comments (skin integrity,  edema, etc.): pt on RA, difficult to obtain sat reading with ambulation 2/2 unrelaible pleth reading. Pt RR estimated by PT to be >60, PT often encouraging for pursed lip breathing and for the pt to increase volume of inspiration and reduce RR. Once able to obtain accurate saturation reading pt is at 100% when seated and resting.      Pertinent  Vitals/Pain Pain Assessment: No/denies pain    Home Living                      Prior Function            PT Goals (current goals can now be found in the care plan section) Acute Rehab PT Goals Patient Stated Goal: Breathe better Progress towards PT goals: Not progressing toward goals - comment (regressing some)    Frequency    Min 3X/week      PT Plan Discharge plan needs to be updated    Co-evaluation              AM-PAC PT "6 Clicks" Mobility   Outcome Measure  Help needed turning from your back to your side while in a flat bed without using bedrails?: None Help needed moving from lying on your back to sitting on the side of a flat bed without using bedrails?: A Little Help needed moving to and from a bed to a chair (including a wheelchair)?: A Little Help needed standing up from a chair using your arms (e.g., wheelchair or bedside chair)?: A Little Help needed to walk in hospital room?: A Little Help needed climbing 3-5 steps with a railing? : A Little 6 Click Score: 19    End of Session   Activity Tolerance: Patient limited by fatigue Patient left: in bed;with call bell/phone within reach;with bed alarm set Nurse Communication: Mobility status PT Visit Diagnosis: Other abnormalities of gait and mobility (R26.89);Other (comment)     Time: 4259-5638 PT Time Calculation (min) (ACUTE ONLY): 25 min  Charges:  $Gait Training: 8-22 mins $Therapeutic Activity: 8-22 mins                     Arlyss Gandy, PT, DPT Acute Rehabilitation Pager: [phone]    Arlyss Gandy 06/04/2020, 10:57 AM

## 2020-06-04 NOTE — Progress Notes (Signed)
Subjective:  No new complaints   Antibiotics:  Anti-infectives (From admission, onward)   Start     Dose/Rate Route Frequency Ordered Stop   06/02/20 0000  ceFAZolin (ANCEF) IVPB        2 g Intravenous Every 8 hours 06/02/20 0544 07/07/20 2359   05/30/20 0715  meropenem (MERREM) 1 g in sodium chloride 0.9 % 100 mL IVPB  Status:  Discontinued        1 g 200 mL/hr over 30 Minutes Intravenous Every 8 hours 05/30/20 0621 05/30/20 1356   05/30/20 0615  linezolid (ZYVOX) IVPB 600 mg  Status:  Discontinued        600 mg 300 mL/hr over 60 Minutes Intravenous Every 12 hours 05/30/20 0609 05/30/20 1357   05/25/20 2200  ceFAZolin (ANCEF) IVPB 2g/100 mL premix        2 g 200 mL/hr over 30 Minutes Intravenous Every 8 hours 05/25/20 0945     05/24/20 2200  cefTRIAXone (ROCEPHIN) 2 g in sodium chloride 0.9 % 100 mL IVPB  Status:  Discontinued        2 g 200 mL/hr over 30 Minutes Intravenous Every 12 hours 05/24/20 1934 05/25/20 0936   05/24/20 1715  ceFEPIme (MAXIPIME) 2 g in sodium chloride 0.9 % 100 mL IVPB  Status:  Discontinued        2 g 200 mL/hr over 30 Minutes Intravenous Every 24 hours 05/24/20 1713 05/24/20 1934   05/24/20 1715  vancomycin (VANCOREADY) IVPB 1250 mg/250 mL        1,250 mg 166.7 mL/hr over 90 Minutes Intravenous  Once 05/24/20 1713 05/24/20 2153   05/24/20 1713  vancomycin variable dose per unstable renal function (pharmacist dosing)  Status:  Discontinued         Does not apply See admin instructions 05/24/20 1713 05/25/20 0936   05/24/20 1645  metroNIDAZOLE (FLAGYL) IVPB 500 mg        500 mg 100 mL/hr over 60 Minutes Intravenous  Once 05/24/20 1644 05/24/20 1850      Medications: Scheduled Meds: . Chlorhexidine Gluconate Cloth  6 each Topical Daily  . feeding supplement  237 mL Oral BID BM  . heparin  5,000 Units Subcutaneous Q8H  . multivitamin with minerals  1 tablet Oral Daily  . sodium chloride flush  10 mL Intracatheter Q8H  . sodium chloride  flush  10-40 mL Intracatheter Q12H   Continuous Infusions: .  ceFAZolin (ANCEF) IV 2 g (06/04/20 1520)   PRN Meds:.acetaminophen, albuterol, docusate sodium, oxyCODONE, polyethylene glycol, sodium chloride flush    Objective: Weight change:   Intake/Output Summary (Last 24 hours) at 06/04/2020 1525 Last data filed at 06/04/2020 1478 Gross per 24 hour  Intake 420 ml  Output 900 ml  Net -480 ml   Blood pressure 136/78, pulse (!) 108, temperature (!) 100.5 F (38.1 C), temperature source Oral, resp. rate 18, height _0  (1.905 m), weight 66.1 kg, SpO2 100 %. Temp:  [99.4 F (37.4 C)-102 F (38.9 C)] 100.5 F (38.1 C) (04/14 1355) Pulse Rate:  [89-108] 108 (04/14 1355) Resp:  [16-18] 18 (04/14 1355) BP: (118-136)/(72-82) 136/78 (04/14 1355) SpO2:  [97 %-100 %] 100 % (04/14 1355) Weight:  [66.1 kg] 66.1 kg (04/14 2956)  Physical Exam: Physical Exam Constitutional:      Appearance: He is cachectic.  HENT:     Head: Normocephalic and atraumatic.  Eyes:     Conjunctiva/sclera: Conjunctivae normal.  Cardiovascular:  Rate and Rhythm: Normal rate and regular rhythm.  Pulmonary:     Effort: No respiratory distress.     Breath sounds: Examination of the right-middle field reveals decreased breath sounds. Examination of the right-lower field reveals decreased breath sounds. Decreased breath sounds present. No wheezing.  Abdominal:     General: There is no distension.     Palpations: Abdomen is soft.  Musculoskeletal:        General: Normal range of motion.     Cervical back: Normal range of motion and neck supple.  Skin:    General: Skin is warm and dry.     Findings: No erythema or rash.  Neurological:     General: No focal deficit present.     Mental Status: He is alert and oriented to person, place, and time.  Psychiatric:        Mood and Affect: Mood normal.        Behavior: Behavior normal.        Thought Content: Thought content normal.        Judgment: Judgment  normal.      CBC:    BMET Recent Labs    06/03/20 0145 06/04/20 0309  NA 130* 134*  K 3.6 3.9  CL 95* 99  CO2 28 26  GLUCOSE 98 103*  BUN 20 16  CREATININE 1.08 1.00  CALCIUM 7.2* 7.3*     Liver Panel  No results for input(s): PROT, ALBUMIN, AST, ALT, ALKPHOS, BILITOT, BILIDIR, IBILI in the last 72 hours.     Sedimentation Rate No results for input(s): ESRSEDRATE in the last 72 hours. C-Reactive Protein No results for input(s): CRP in the last 72 hours.  Micro Results: Recent Results (from the past 720 hour(s))  Blood Culture (routine x 2)     Status: Abnormal   Collection Time: 05/24/20  3:08 PM   Specimen: Left Antecubital; Blood  Result Value Ref Range Status   Specimen Description LEFT ANTECUBITAL  Final   Special Requests   Final    BOTTLES DRAWN AEROBIC AND ANAEROBIC Blood Culture results may not be optimal due to an inadequate volume of blood received in culture bottles   Culture  Setup Time   Final    GRAM POSITIVE COCCI IN CLUSTERS IN BOTH AEROBIC AND ANAEROBIC BOTTLES CRITICAL RESULT CALLED TO, READ BACK BY AND VERIFIED WITH: PHARMD A WOLF [id number] AT 56 AM BY CM Performed at [facility], [place] [address] [place], [place] [address]    Culture STAPHYLOCOCCUS AUREUS (A)  Final   Report Status 05/27/2020 FINAL  Final   Organism ID, Bacteria STAPHYLOCOCCUS AUREUS  Final      Susceptibility   Staphylococcus aureus - MIC*    CIPROFLOXACIN <=0.5 SENSITIVE Sensitive     ERYTHROMYCIN >=8 RESISTANT Resistant     GENTAMICIN <=0.5 SENSITIVE Sensitive     OXACILLIN 0.5 SENSITIVE Sensitive     TETRACYCLINE <=1 SENSITIVE Sensitive     VANCOMYCIN <=0.5 SENSITIVE Sensitive     TRIMETH/SULFA <=10 SENSITIVE Sensitive     CLINDAMYCIN <=0.25 SENSITIVE Sensitive     RIFAMPIN <=0.5 SENSITIVE Sensitive     Inducible Clindamycin NEGATIVE Sensitive     * STAPHYLOCOCCUS AUREUS  Blood Culture ID Panel (Reflexed)     Status: Abnormal   Collection Time: 05/24/20   3:08 PM  Result Value Ref Range Status   Enterococcus faecalis NOT DETECTED NOT DETECTED Final   Enterococcus Faecium NOT DETECTED NOT DETECTED Final  Listeria monocytogenes NOT DETECTED NOT DETECTED Final   Staphylococcus species DETECTED (A) NOT DETECTED Final    Comment: CRITICAL RESULT CALLED TO, READ BACK BY AND VERIFIED WITH: PHARMD A WOLF [id number] AT 800 BY CM    Staphylococcus aureus (BCID) DETECTED (A) NOT DETECTED Final    Comment: CRITICAL RESULT CALLED TO, READ BACK BY AND VERIFIED WITH: PHARMD A WOLF [id number] AT 800 BY CM    Staphylococcus epidermidis NOT DETECTED NOT DETECTED Final   Staphylococcus lugdunensis NOT DETECTED NOT DETECTED Final   Streptococcus species NOT DETECTED NOT DETECTED Final   Streptococcus agalactiae NOT DETECTED NOT DETECTED Final   Streptococcus pneumoniae NOT DETECTED NOT DETECTED Final   Streptococcus pyogenes NOT DETECTED NOT DETECTED Final   A.calcoaceticus-baumannii NOT DETECTED NOT DETECTED Final   Bacteroides fragilis NOT DETECTED NOT DETECTED Final   Enterobacterales NOT DETECTED NOT DETECTED Final   Enterobacter cloacae complex NOT DETECTED NOT DETECTED Final   Escherichia coli NOT DETECTED NOT DETECTED Final   Klebsiella aerogenes NOT DETECTED NOT DETECTED Final   Klebsiella oxytoca NOT DETECTED NOT DETECTED Final   Klebsiella pneumoniae NOT DETECTED NOT DETECTED Final   Proteus species NOT DETECTED NOT DETECTED Final   Salmonella species NOT DETECTED NOT DETECTED Final   Serratia marcescens NOT DETECTED NOT DETECTED Final   Haemophilus influenzae NOT DETECTED NOT DETECTED Final   Neisseria meningitidis NOT DETECTED NOT DETECTED Final   Pseudomonas aeruginosa NOT DETECTED NOT DETECTED Final   Stenotrophomonas maltophilia NOT DETECTED NOT DETECTED Final   Candida albicans NOT DETECTED NOT DETECTED Final   Candida auris NOT DETECTED NOT DETECTED Final   Candida glabrata NOT DETECTED NOT DETECTED Final   Candida krusei NOT DETECTED NOT  DETECTED Final   Candida parapsilosis NOT DETECTED NOT DETECTED Final   Candida tropicalis NOT DETECTED NOT DETECTED Final   Cryptococcus neoformans/gattii NOT DETECTED NOT DETECTED Final   Meth resistant mecA/C and MREJ NOT DETECTED NOT DETECTED Final    Comment: Performed at [facility], [address] [place], [place] [address]  Resp Panel by RT-PCR (Flu A&B, Covid) Nasopharyngeal Swab     Status: None   Collection Time: 05/24/20  3:14 PM   Specimen: Nasopharyngeal Swab; Nasopharyngeal(NP) swabs in vial transport medium  Result Value Ref Range Status   SARS Coronavirus 2 by RT PCR NEGATIVE NEGATIVE Final    Comment: (NOTE) SARS-CoV-2 target nucleic acids are NOT DETECTED.  The SARS-CoV-2 RNA is generally detectable in upper respiratory specimens during the acute phase of infection. The lowest concentration of SARS-CoV-2 viral copies this assay can detect is 138 copies/mL. A negative result does not preclude SARS-Cov-2 infection and should not be used as the sole basis for treatment or other patient management decisions. A negative result may occur with  improper specimen collection/handling, submission of specimen other than nasopharyngeal swab, presence of viral mutation(s) within the areas targeted by this assay, and inadequate number of viral copies(<138 copies/mL). A negative result must be combined with clinical observations, patient history, and epidemiological information. The expected result is Negative.  Fact Sheet for Patients:  EntrepreneurPulse.com.au  Fact Sheet for Healthcare Providers:  IncredibleEmployment.be  This test is no t yet approved or cleared by the [place] FDA and  has been authorized for detection and/or diagnosis of SARS-CoV-2 by FDA under an Emergency Use Authorization (EUA). This EUA will remain  in effect (meaning this test can be used) for the duration of the COVID-19 declaration under Section  564(b)(1) of  the Act, 21 U.S.C.section 360bbb-3(b)(1), unless the authorization is terminated  or revoked sooner.       Influenza A by PCR NEGATIVE NEGATIVE Final   Influenza B by PCR NEGATIVE NEGATIVE Final    Comment: (NOTE) The Xpert Xpress SARS-CoV-2/FLU/RSV plus assay is intended as an aid in the diagnosis of influenza from Nasopharyngeal swab specimens and should not be used as a sole basis for treatment. Nasal washings and aspirates are unacceptable for Xpert Xpress SARS-CoV-2/FLU/RSV testing.  Fact Sheet for Patients: EntrepreneurPulse.com.au  Fact Sheet for Healthcare Providers: IncredibleEmployment.be  This test is not yet approved or cleared by the [place] FDA and has been authorized for detection and/or diagnosis of SARS-CoV-2 by FDA under an Emergency Use Authorization (EUA). This EUA will remain in effect (meaning this test can be used) for the duration of the COVID-19 declaration under Section 564(b)(1) of the Act, 21 U.S.C. section 360bbb-3(b)(1), unless the authorization is terminated or revoked.  Performed at [facility], [place] [address], [place], [place] [address]   Blood Culture (routine x 2)     Status: Abnormal   Collection Time: 05/24/20  9:45 PM   Specimen: BLOOD LEFT HAND  Result Value Ref Range Status   Specimen Description BLOOD LEFT HAND  Final   Special Requests   Final    BOTTLES DRAWN AEROBIC AND ANAEROBIC Blood Culture results may not be optimal due to an inadequate volume of blood received in culture bottles   Culture  Setup Time   Final    GRAM POSITIVE COCCI IN CLUSTERS IN BOTH AEROBIC AND ANAEROBIC BOTTLES CRITICAL VALUE NOTED.  VALUE IS CONSISTENT WITH PREVIOUSLY REPORTED AND CALLED VALUE.    Culture (A)  Final    STAPHYLOCOCCUS AUREUS SUSCEPTIBILITIES PERFORMED ON PREVIOUS CULTURE WITHIN THE LAST 5 DAYS. Performed at [facility], [place] [address] [place], [place] [address]     Report Status 05/27/2020 FINAL  Final  MRSA PCR Screening     Status: None   Collection Time: 05/24/20 11:46 PM   Specimen: Nasopharyngeal  Result Value Ref Range Status   MRSA by PCR NEGATIVE NEGATIVE Final    Comment:        The GeneXpert MRSA Assay (FDA approved for NASAL specimens only), is one component of a comprehensive MRSA colonization surveillance program. It is not intended to diagnose MRSA infection nor to guide or monitor treatment for MRSA infections. Performed at [facility], [place] [address], [place], [place] [address]   Urine culture     Status: None   Collection Time: 05/24/20 11:59 PM   Specimen: In/Out Cath Urine  Result Value Ref Range Status   Specimen Description IN/OUT CATH URINE  Final   Special Requests NONE  Final   Culture   Final    NO GROWTH Performed at [facility], [place] [address] [place], [place] [address]    Report Status 05/26/2020 FINAL  Final  Culture, blood (routine x 2)     Status: None   Collection Time: 05/25/20 10:19 PM   Specimen: BLOOD LEFT HAND  Result Value Ref Range Status   Specimen Description BLOOD LEFT HAND  Final   Special Requests   Final    BOTTLES DRAWN AEROBIC AND ANAEROBIC Blood Culture adequate volume   Culture   Final    NO GROWTH 5 DAYS Performed at [facility], [place] [address], [place],  [address]    Report Status 05/30/2020 FINAL  Final  Culture, blood (routine x 2)     Status: None   Collection Time: 05/25/20 10:32 PM   Specimen: BLOOD RIGHT HAND  Result Value Ref Range Status   Specimen Description BLOOD RIGHT HAND  Final   Special Requests AEROBIC BOTTLE ONLY Blood Culture adequate volume  Final   Culture   Final    NO GROWTH 5 DAYS Performed at [facility], [address], [place], [place] [address]    Report Status 05/30/2020 FINAL  Final  Expectorated Sputum Assessment w Gram Stain, Rflx to Resp Cult     Status: None   Collection Time: 05/29/20  7:11 AM   Specimen:  Expectorated Sputum  Result Value Ref Range Status   Specimen Description EXPECTORATED SPUTUM  Final   Special Requests Normal  Final   Sputum evaluation   Final    THIS SPECIMEN IS ACCEPTABLE FOR SPUTUM CULTURE Performed at [facility], [place] [address], [place], [place] [address]    Report Status 05/29/2020 FINAL  Final  Culture, Respiratory w Gram Stain     Status: None   Collection Time: 05/29/20  7:11 AM  Result Value Ref Range Status   Specimen Description EXPECTORATED SPUTUM  Final   Special Requests Normal Reflexed from [id number]  Final   Gram Stain   Final    ABUNDANT WBC PRESENT,BOTH PMN AND MONONUCLEAR FEW SQUAMOUS EPITHELIAL CELLS PRESENT ABUNDANT GRAM NEGATIVE RODS ABUNDANT GRAM POSITIVE COCCI FEW YEAST    Culture   Final    FEW Consistent with normal respiratory flora. No Pseudomonas species isolated Performed at [place] [address], [place], [place] [address]    Report Status 06/01/2020 FINAL  Final  Culture, blood (routine x 2)     Status: None (Preliminary result)   Collection Time: 05/31/20  8:07 AM   Specimen: BLOOD LEFT HAND  Result Value Ref Range Status   Specimen Description BLOOD LEFT HAND  Final   Special Requests   Final    BOTTLES DRAWN AEROBIC AND ANAEROBIC Blood Culture adequate volume   Culture   Final    NO GROWTH 4 DAYS Performed at [facility], [place] [address] [place], [place] [address]    Report Status PENDING  Incomplete  Culture, blood (routine x 2)     Status: None (Preliminary result)   Collection Time: 05/31/20  8:12 AM   Specimen: BLOOD RIGHT HAND  Result Value Ref Range Status   Specimen Description BLOOD RIGHT HAND  Final   Special Requests   Final    BOTTLES DRAWN AEROBIC ONLY Blood Culture results may not be optimal due to an inadequate volume of blood received in culture bottles   Culture   Final    NO GROWTH 4 DAYS Performed at [facility], [place] [address], [place], [place] [address]     Report Status PENDING  Incomplete  Gram stain     Status: None   Collection Time: 05/31/20 11:52 AM   Specimen: Fluid  Result Value Ref Range Status   Specimen Description FLUID  Final   Special Requests PLEURAL FLUID LEFT  Final   Gram Stain   Final    FEW WBC PRESENT, PREDOMINANTLY PMN NO ORGANISMS SEEN Performed at [facility], [place] [address], [place],  [address]    Report Status 05/31/2020 FINAL  Final  Culture, body fluid w Gram Stain-bottle     Status: None (Preliminary result)  Collection Time: 05/31/20 11:52 AM   Specimen: Fluid  Result Value Ref Range Status   Specimen Description FLUID  Final   Special Requests PLEURAL FLUID LEFT  Final   Culture   Final    NO GROWTH 4 DAYS Performed at [facility], [address] [place], [place] [address]    Report Status PENDING  Incomplete    Studies/Results: DG CHEST PORT 1 VIEW  Result Date: 06/03/2020 CLINICAL DATA:  Left pleural effusion EXAM: PORTABLE CHEST 1 VIEW COMPARISON:  06/02/2020 FINDINGS: bilateral airspace disease and cavitary lesions again noted, left greater than right, unchanged since prior study. Small bilateral pleural effusions. No pneumothorax. Heart is normal size. IMPRESSION: Small bilateral pleural effusions with bilateral cavitary airspace disease, not significantly changed. Electronically Signed   By: Rolm Baptise M.D.   On: 06/03/2020 08:06   [place] EKG SITE RITE  Result Date: 06/03/2020 If Site Rite image not attached, placement could not be confirmed due to current cardiac rhythm.     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  While his procalcitonin has trended down his white cells however now at 45,000 and he is spiking temperatures to nearly 103 degrees.  I suspect the critical issue is management of the pleural space   He now has had Chest tube removed  I would want to give him 6 weeks of IV antibiotics given findings in the lungs to suggest embolization of vegetations from right sided endocarditis  Diagnosis: MSSA bacteremia endocarditis septic embolization left arm infection  Culture Result: MSSA  No Known Allergies  OPAT Orders Discharge antibiotics:  Cefazolin    Duration:  6 weeksEnd Date:  07/06/2020  [facility] Care Per Protocol:    Labs  weekly while on IV antibiotics: _x_ CBC with differential _x_ BMP w GFR/CMP _x_ CRP _x_ ESR   _x_ Please pull PIC at completion of IV antibiotics __ Please leave PIC in place until doctor has seen patient or been notified  Fax weekly labs to [phone]   Edward Mueller has an appointment on 07/03/2020 at 330 PM with Dr. Tommy Medal  The [facility] is located in the [facility] at  [place] in [place].  [address], which is located to the left of the elevators.  Phone: [phone]  Fax: [phone]  [email or web address]  He should arrive 15 to 30 minutes prior to his appointment.  I spent greater than 35 minutes with the patient including greater than 50% of time in face to face counsel of the patient regarding his severe MSSA infection and in coordination of his care     LOS: 11 days   Alcide Evener 06/04/2020, 3:25 PM

## 2020-06-04 NOTE — Progress Notes (Signed)
Peripherally Inserted Central Catheter Placement  The IV Nurse has discussed with the patient and/or persons authorized to consent for the patient, the purpose of this procedure and the potential benefits and risks involved with this procedure.  The benefits include less needle sticks, lab draws from the catheter, and the patient may be discharged home with the catheter. Risks include, but not limited to, infection, bleeding, blood clot (thrombus formation), and puncture of an artery; nerve damage and irregular heartbeat and possibility to perform a PICC exchange if needed/ordered by physician.  Alternatives to this procedure were also discussed.  Bard Power PICC patient education guide, fact sheet on infection prevention and patient information card has been provided to patient /or left at bedside.    PICC Placement Documentation  PICC Single Lumen 06/04/20 Right Brachial 40 cm 0 cm (Active)  Indication for Insertion or Continuance of Line Home intravenous therapies (PICC only) 06/04/20 1200  Exposed Catheter (cm) 0 cm 06/04/20 1200  Site Assessment Clean;Dry;Intact 06/04/20 1200  Line Status Flushed;Saline locked;Blood return noted 06/04/20 1200  Dressing Type Transparent;Securing device 06/04/20 1200  Dressing Status Clean;Dry;Intact 06/04/20 1200  Antimicrobial disc in place? Yes 06/04/20 1200  Safety Lock Not Applicable 06/04/20 1200  Line Care Connections checked and tightened 06/04/20 1200  Dressing Intervention New dressing 06/04/20 1200  Dressing Change Due 06/11/20 06/04/20 1200       Franne Grip Renee 06/04/2020, 12:20 PM

## 2020-06-04 NOTE — Evaluation (Signed)
Occupational Therapy Re-Evaluation Patient Details Name: Edward Mueller MRN: [id number] DOB: May 01, 1970 Today's Date: 06/04/2020    History of Present Illness Pt is a 50 y.o. 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 Chest CT shows multiple peripheral infiltrates consistent with embolic phenomena; concern for endocarditis. Workup for compensated septic shock due to LUE cellulitis, MSSA bactermia of unclear source.  S/p TEE 4/6 with no evidence of endocarditis. UDS (+) THC. Pt with loculated R pleural effusion s/p R chest tube placemtn 4/9. S/p L thoracentesis 4/10. S/p chest tube removal 4/12. No reported PMH; pt reports donating plasma 2x/wk.   Clinical Impression   Pt reassessed due to prolonged hospitalization and deconditioning. Pt currently requires up to min assist for ADL and mobility with RW. He demonstrates poor activity tolerance and anxiety as he becomes more SOB. Instructed pt to walk to bathroom, get up to chair for all meals and do as much of his ADL as possible with nursing staff. Encouraged use of IS. Pt verbalizing understanding. Will follow acutely.     Follow Up Recommendations  Home health OT    Equipment Recommendations  3 in 1 bedside commode    Recommendations for Other Services       Precautions / Restrictions Precautions Precautions: Fall Precaution Comments: SOB with minimal activity, monitor SpO2, RR, HR      Mobility Bed Mobility Overal bed mobility: Modified Independent             General bed mobility comments: Incr time and effort, use of rail    Transfers Overall transfer level: Needs assistance Equipment used: Rolling walker (2 wheeled) Transfers: Sit to/from Stand Sit to Stand: Min assist         General transfer comment: min assist to rise    Balance Overall balance assessment: Needs assistance   Sitting  balance-Leahy Scale: Good     Standing balance support: Single extremity supported;Bilateral upper extremity supported Standing balance-Leahy Scale: Poor Standing balance comment: uses one hand on sink or leans, B UE support for ambulation                           ADL either performed or assessed with clinical judgement   ADL Overall ADL's : Needs assistance/impaired Eating/Feeding: Independent   Grooming: Wash/dry hands;Standing;Min guard Grooming Details (indicate cue type and reason): only tolerant of one activity Upper Body Bathing: Minimal assistance;Sitting   Lower Body Bathing: Minimal assistance;Sit to/from stand   Upper Body Dressing : Set up;Sitting   Lower Body Dressing: Sit to/from stand;Minimal assistance   Toilet Transfer: Ambulation;Min guard;RW   Toileting- Clothing Manipulation and Hygiene: Supervision/safety;Sit to/from stand       Functional mobility during ADLs: Min guard;Rolling walker General ADL Comments: Pt with limited activity tolerance. ADL requires extended time due to need for frequent rest breaks. Began instruction in energy conservation and breathing techniques.     Vision Baseline Vision/History: No visual deficits       Perception     Praxis      Pertinent Vitals/Pain Pain Assessment: Faces Faces Pain Scale: Hurts a little bit Pain Location: Chest tube removal site, chest with coughing Pain Descriptors / Indicators: Sore Pain Intervention(s): Monitored during session;Repositioned     Hand Dominance Right   Extremity/Trunk Assessment Upper Extremity Assessment Upper Extremity Assessment: Overall WFL for tasks assessed   Lower Extremity Assessment Lower Extremity  Assessment: Defer to PT evaluation   Cervical / Trunk Assessment Cervical / Trunk Assessment: Normal   Communication Communication Communication: No difficulties   Cognition Arousal/Alertness: Awake/alert Behavior During Therapy: Anxious Overall  Cognitive Status: Within Functional Limits for tasks assessed                                     General Comments       Exercises     Shoulder Instructions      Home Living Family/patient expects to be discharged to:: Private residence Living Arrangements: Parent Available Help at Discharge: Family;Available PRN/intermittently Type of Home: House Home Access: Stairs to enter [employer] of Steps: 3 Entrance Stairs-Rails: Right Home Layout: One level     Bathroom Shower/Tub: Chief Strategy Officer: Standard     Home Equipment: None   Additional Comments: Lives with mother      Prior Functioning/Environment Level of Independence: Independent        Comments: Works in housekeeping; enjoys walking for exercise. Drives        OT Problem List: Decreased strength;Decreased activity tolerance;Pain;Cardiopulmonary status limiting activity;Decreased knowledge of use of DME or AE;Impaired balance (sitting and/or standing)      OT Treatment/Interventions: Self-care/ADL training;Energy conservation;Patient/family education;Balance training;Therapeutic activities    OT Goals(Current goals can be found in the care plan section) Acute Rehab OT Goals Patient Stated Goal: Breathe better OT Goal Formulation: With patient Time For Goal Achievement: 06/18/20 Potential to Achieve Goals: Good ADL Goals Pt Will Perform Grooming: (P) with supervision;standing (3 activities) Pt Will Perform Lower Body Bathing: (P) with supervision;sit to/from stand Pt Will Perform Lower Body Dressing: (P) with supervision;sit to/from stand Pt Will Transfer to Toilet: (P) with supervision;ambulating;bedside commode (over toilet) Pt Will Perform Toileting - Clothing Manipulation and hygiene: (P) with supervision;sit to/from stand Additional ADL Goal #1: (P) Pt will generalize energy conservation and breathing techniques in ADL and mobility independently.  OT  Frequency: Min 2X/week   Barriers to D/C:            Co-evaluation              AM-PAC OT "6 Clicks" Daily Activity     Outcome Measure Help from another person eating meals?: None Help from another person taking care of personal grooming?: A Little Help from another person toileting, which includes using toliet, bedpan, or urinal?: A Little Help from another person bathing (including washing, rinsing, drying)?: A Little Help from another person to put on and taking off regular upper body clothing?: A Little Help from another person to put on and taking off regular lower body clothing?: A Little 6 Click Score: 19   End of Session Equipment Utilized During Treatment: Rolling walker;Gait belt  Activity Tolerance: Patient limited by fatigue Patient left: in bed;with call bell/phone within reach  OT Visit Diagnosis: Unsteadiness on feet (R26.81);Pain;Muscle weakness (generalized) (M62.81) (decreased activity tolerance)                Time: 1443-1540 OT Time Calculation (min): 32 min Charges:  OT General Charges $OT Visit: 1 Visit OT Evaluation Re-evaluation OT Treatments $Self Care/Home Management : 8-22 mins  Martie Round, OTR/L Acute Rehabilitation Services Pager: [phone] Office: [phone]  Evern Bio 06/04/2020, 3:06 PM

## 2020-06-04 NOTE — Progress Notes (Addendum)
Progress Note    Edward Mueller   LKG:[id number]  DOB: 06-Mar-1970  DOA: 05/24/2020     11  PCP: Pcp, No  CC: "feeling sick"  Hospital Course: 50 year old gentleman with no reported medical problems who donates plasma 2 times a week, donated plasma on 05/22/20 and after about 24 hours he started not feeling well.  Reported symptoms included generalized malaise, nausea, vomiting, shortness of breath, pleuritic chest pain, cough with blood-tinged sputum. Came to the emergency room on 05/24/20, was febrile, tachypneic with lactic acidosis.  Found to have multilobular pneumonia on chest x-ray, left upper extremity cellulitis and left acute superficial vein thrombosis involving the left cephalic vein on 6/6/44034/05/2020.  Was started on IV antibiotics and IV fluids and admitted to ICU for closer monitoring then later transferred to progressive care unit.  Work-up revealed MSSA bacteremia for which infectious disease was consulted.  No vegetation was seen on TEE on 05/27/2020.  Sputum culture from 05/29/2020 growing abundant gram-negative rods, abundant gram-positive cocci with few yeast, culture was reincubated for better growth.  He has been on cefazolin for MSSA bacteremia as recommended by infectious disease.  Repeated CT chest without contrast done on 05/30/2020 showed interval worsening of the aeration of the lungs.  IR was consulted for thoracentesis and chest tube placement due to loculated pleural effusion.   Post right thoracentesis by IR on 05/30/2020 with approximately 300 cc translucent, serosanguineous fluid removed and right chest tube placement.  Post left thoracentesis by IR on 05/31/2020 with 550 cc dark red fluid removed.  Chest tube is managed by PCCM.  Right-sided chest tube was removed on 06/02/2020 with no reaccumulation of fluid.   Interval History:  No events overnight. Denies CP, SOB, fevers, chills.  Still having ongoing weak cough and he is spitting into a cup at bedside.  Other than his  weakness/deconditioning, he seems to be stable and doing okay. Low-grade fevers noted since yesterday.  ROS: Constitutional: negative for chills and fevers, Respiratory: negative for sputum and wheezing, Cardiovascular: negative for chest pain and Gastrointestinal: negative for abdominal pain  Assessment & Plan: Severe sepsis, improving, secondary to LUE cellulitis(now resolved), MSSA bacteremia, multifocal pneumonia and septic pulmonary emboli, all POA Endocarditis  Clinically improving. 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 No evidence of valvular vegetation on TEE. Appreciate infectious disease and PCCM assistance. He is currently on IV cefazolin, continue as recommended by infectious disease.   - Plan is for 6 weeks treatment, end date 07/06/2020 Repeated blood culture from 05/25/2020, negative final. Sputum culture obtained on 05/29/2020 revealed few consistent with normal respiratory flora, no Pseudomonas species isolated.   Blood culture repeated on 05/31/2020 peripherally negative to date. WBC is downtrending, procalcitonin is also downtrending. -PICC line placed 06/04/2020 -Suspect his fevers may be due in part to atelectasis from his shallow respirations.  Incentive spirometer is bedside  Acute hypoxic respiratory failure secondary to multifocal pneumonia, loculated right parapneumonic effusion, simple left parapneumonic effusion, and extensive bilateral septic pulmonary emboli D-dimer on presentation greater than 4, repeated 2.83 on 06/02/2020. On IV antibiotics as stated above. Post left and right thoracentesis and right chest tube placement by IR. -Right chest tube removed on 06/02/2020.  No significant reaccumulation on CXR obtained on 06/03/2020 Continue to maintain O2 saturation greater than 90%. Now on RA.   Loculated right parapneumonic effusion, simple left parapneumonic effusion, post right thoracentesis and right thoracostomy tube placement by IR on 05/30/2020, post left  thoracentesis on 05/31/2020 by IR. - s/p right chest tube removed  06/02/20  Severe protein calorie malnutrition due to severe, critical illness Albumin 1.4 on 05/31/2020 BMI 19 Dietitian consulted for assessment of nutrition requirements Continue to encourage increase in oral protein calorie intake.  Resolved post repletion.  Hypocalcemia Calcium corrected for albumin 9.5.  Resolved Non anion gap metabolic acidosis  AKI on CKD 3B, improving. Presented with creatinine of 2.7 Creatinine downtrending Continue to monitor urine output  Hypovolemic hyponatremia Encourage increase in oral intake - stable   Repeat BMP in the morning  Hypokalemia Repleted Repeat BMP in the morning  Hypophosphatemia - repleted   Resolved left upper extremity cellulitis Initially presented with left upper extremity cellulitis which has been treated and has now resolved  Left upper extremity acute superficial vein thrombosis Continue to monitor  THC/tobacco use UDS positive for THC on 05/24/2020 Denies use of cocaine or intravenous drug abuse Cessation counseling when hemodynamically stable.  Generalized weakness in the setting of severe, critical illness Out of bed to chair with every shift as tolerated Ambulate patient with every shift as tolerated Increase oral protein calorie intake as tolerated PT assessed with no further recommendations. Continue fall precautions.  Old records reviewed in assessment of this patient  Antimicrobials: Ancef 05/25/20>>current   DVT prophylaxis: heparin injection 5,000 Units Start: 05/24/20 2330 SCDs Start: 05/24/20 2315   Code Status:   Code Status: Full Code Family Communication:   Disposition Plan: Status is: Inpatient  Remains inpatient appropriate because:IV treatments appropriate due to intensity of illness or inability to take PO and Inpatient level of care appropriate due to severity of illness   Dispo:  Patient From:  Home  Planned Disposition: Home  Medically stable for discharge: No     Risk of unplanned readmission score: Unplanned Admission- Pilot do not use: 12.38   Objective: Blood pressure 136/78, pulse (!) 108, temperature (!) 100.5 F (38.1 C), temperature source Oral, resp. rate 18, height 6\' 3"  (1.905 m), weight 66.1 kg, SpO2 100 %.  Examination: General appearance: alert, cooperative and no distress Head: Normocephalic, without obvious abnormality, atraumatic Eyes: EOMI Back: right chest tube site sore to palpation but no swelling or surrounding signs of infection Lungs: decreased right sided breath sounds, no wheezing Heart: regular rate and rhythm and S1, S2 normal Abdomen: normal findings: bowel sounds normal and soft, non-tender Extremities: no edema Skin: mobility and turgor normal Neurologic: Grossly normal  Consultants:   ID  Radiology   Pulm  Procedures:   2D echo on 05/25/2020  TEE on 05/27/2020.  Chest tube placement, right, 05/30/20  Right chest tube removal on 06/02/2020.  Data Reviewed: I have personally reviewed following labs and imaging studies Results for orders placed or performed during the hospital encounter of 05/24/20 (from the past 24 hour(s))  CBC     Status: Abnormal   Collection Time: 06/04/20  3:09 AM  Result Value Ref Range   WBC 23.2 (H) 4.0 - 10.5 K/uL   RBC 2.84 (L) 4.22 - 5.81 MIL/uL   Hemoglobin 9.2 (L) 13.0 - 17.0 g/dL   HCT 06/06/20 (L) 27.0 - 35.0 %   MCV 94.4 80.0 - 100.0 fL   MCH 32.4 26.0 - 34.0 pg   MCHC 34.3 30.0 - 36.0 g/dL   RDW 09.3 81.8 - 29.9 %   Platelets 693 (H) 150 - 400 K/uL   nRBC 0.0 0.0 - 0.2 %  Basic metabolic panel     Status: Abnormal   Collection Time: 06/04/20  3:09 AM  Result Value Ref Range  Sodium 134 (L) 135 - 145 mmol/L   Potassium 3.9 3.5 - 5.1 mmol/L   Chloride 99 98 - 111 mmol/L   CO2 26 22 - 32 mmol/L   Glucose, Bld 103 (H) 70 - 99 mg/dL   BUN 16 6 - 20 mg/dL   Creatinine, Ser 1.61 0.61 - 1.24 mg/dL    Calcium 7.3 (L) 8.9 - 10.3 mg/dL   GFR, Estimated >09 >60 mL/min   Anion gap 9 5 - 15  Magnesium     Status: None   Collection Time: 06/04/20  3:09 AM  Result Value Ref Range   Magnesium 1.9 1.7 - 2.4 mg/dL    Recent Results (from the past 240 hour(s))  Culture, blood (routine x 2)     Status: None   Collection Time: 05/25/20 10:19 PM   Specimen: BLOOD LEFT HAND  Result Value Ref Range Status   Specimen Description BLOOD LEFT HAND  Final   Special Requests   Final    BOTTLES DRAWN AEROBIC AND ANAEROBIC Blood Culture adequate volume   Culture   Final    NO GROWTH 5 DAYS Performed at [facility], [address] [place], [place] [address]    Report Status 05/30/2020 FINAL  Final  Culture, blood (routine x 2)     Status: None   Collection Time: 05/25/20 10:32 PM   Specimen: BLOOD RIGHT HAND  Result Value Ref Range Status   Specimen Description BLOOD RIGHT HAND  Final   Special Requests AEROBIC BOTTLE ONLY Blood Culture adequate volume  Final   Culture   Final    NO GROWTH 5 DAYS Performed at [facility], [address], [place], [place] [address]    Report Status 05/30/2020 FINAL  Final  Expectorated Sputum Assessment w Gram Stain, Rflx to Resp Cult     Status: None   Collection Time: 05/29/20  7:11 AM   Specimen: Expectorated Sputum  Result Value Ref Range Status   Specimen Description EXPECTORATED SPUTUM  Final   Special Requests Normal  Final   Sputum evaluation   Final    THIS SPECIMEN IS ACCEPTABLE FOR SPUTUM CULTURE Performed at [facility], [address] [place], [place] [address]    Report Status 05/29/2020 FINAL  Final  Culture, Respiratory w Gram Stain     Status: None   Collection Time: 05/29/20  7:11 AM  Result Value Ref Range Status   Specimen Description EXPECTORATED SPUTUM  Final   Special Requests Normal Reflexed from [id number]  Final   Gram Stain   Final    ABUNDANT WBC PRESENT,BOTH PMN AND MONONUCLEAR FEW SQUAMOUS EPITHELIAL  CELLS PRESENT ABUNDANT GRAM NEGATIVE RODS ABUNDANT GRAM POSITIVE COCCI FEW YEAST    Culture   Final    FEW Consistent with normal respiratory flora. No Pseudomonas species isolated Performed at [facility], [address] [place], [place] [address]    Report Status 06/01/2020 FINAL  Final  Culture, blood (routine x 2)     Status: None (Preliminary result)   Collection Time: 05/31/20  8:07 AM   Specimen: BLOOD LEFT HAND  Result Value Ref Range Status   Specimen Description BLOOD LEFT HAND  Final   Special Requests   Final    BOTTLES DRAWN AEROBIC AND ANAEROBIC Blood Culture adequate volume   Culture   Final    NO GROWTH 4 DAYS Performed at [facility], [address] [place], [place] [address]  Report Status PENDING  Incomplete  Culture, blood (routine x 2)     Status: None (Preliminary result)   Collection Time: 05/31/20  8:12 AM   Specimen: BLOOD RIGHT HAND  Result Value Ref Range Status   Specimen Description BLOOD RIGHT HAND  Final   Special Requests   Final    BOTTLES DRAWN AEROBIC ONLY Blood Culture results may not be optimal due to an inadequate volume of blood received in culture bottles   Culture   Final    NO GROWTH 4 DAYS Performed at [facility], [address], [place], [place] [address]    Report Status PENDING  Incomplete  Gram stain     Status: None   Collection Time: 05/31/20 11:52 AM   Specimen: Fluid  Result Value Ref Range Status   Specimen Description FLUID  Final   Special Requests PLEURAL FLUID LEFT  Final   Gram Stain   Final    FEW WBC PRESENT, PREDOMINANTLY PMN NO ORGANISMS SEEN Performed at [facility], [address] [place], [place] [address]    Report Status 05/31/2020 FINAL  Final  Culture, body fluid w Gram Stain-bottle     Status: None (Preliminary result)   Collection Time: 05/31/20 11:52 AM   Specimen: Fluid  Result Value Ref Range Status   Specimen Description FLUID  Final   Special Requests  PLEURAL FLUID LEFT  Final   Culture   Final    NO GROWTH 4 DAYS Performed at [facility], [address] [place], [place] [address]    Report Status PENDING  Incomplete     Radiology Studies: DG CHEST PORT 1 VIEW  Result Date: 06/03/2020 CLINICAL DATA:  Left pleural effusion EXAM: PORTABLE CHEST 1 VIEW COMPARISON:  06/02/2020 FINDINGS: bilateral airspace disease and cavitary lesions again noted, left greater than right, unchanged since prior study. Small bilateral pleural effusions. No pneumothorax. Heart is normal size. IMPRESSION: Small bilateral pleural effusions with bilateral cavitary airspace disease, not significantly changed. Electronically Signed   By: Charlett Nose M.D.   On: 06/03/2020 08:06   [place] EKG SITE RITE  Result Date: 06/03/2020 If Site Rite image not attached, placement could not be confirmed due to current cardiac rhythm.  [place] EKG SITE RITE  Final Result    DG CHEST PORT 1 VIEW  Final Result    DG Chest Port 1 View  Final Result    DG CHEST PORT 1 VIEW  Final Result    CT CHEST WO CONTRAST  Final Result    DG Chest 1 View  Final Result    US THORACENTESIS ASP PLEURAL SPACE W/IMG GUIDE  Final Result    DG CHEST PORT 1 VIEW  Final Result    DG CHEST PORT 1 VIEW  Final Result    US THORACENTESIS ASP PLEURAL SPACE W/IMG GUIDE  Final Result    CT CHEST WO CONTRAST  Final Result    DG CHEST PORT 1 VIEW  Final Result    CT HEAD WO CONTRAST  Final Result    US RENAL  Final Result    DG CHEST PORT 1 VIEW  Final Result    VAS [place] UPPER EXTREMITY VENOUS DUPLEX  Final Result    CT Chest Wo Contrast  Final Result    DG Chest Port 1 View  Final Result    DG CHEST PORT 1 VIEW    (Results Pending)    Scheduled Meds: . Chlorhexidine  Gluconate Cloth  6 each Topical Daily  . feeding supplement  237 mL Oral BID BM  . heparin  5,000 Units Subcutaneous Q8H  . multivitamin with minerals  1 tablet Oral Daily  . sodium chloride flush  10  mL Intracatheter Q8H  . sodium chloride flush  10-40 mL Intracatheter Q12H   PRN Meds: acetaminophen, albuterol, docusate sodium, oxyCODONE, polyethylene glycol, sodium chloride flush Continuous Infusions: .  ceFAZolin (ANCEF) IV 2 g (06/04/20 1520)     LOS: 11 days  Time spent: Greater than 50% of the 35 minute visit was spent in counseling/coordination of care for the patient as laid out in the A&P.   Lewie Chamber, MD [facility] 06/04/2020, 4:30 PM

## 2020-06-04 NOTE — Progress Notes (Signed)
   06/04/20 2020  Assess: MEWS Score  Temp (!) 102.3 F (39.1 C)  BP 132/80  Pulse Rate 97  Resp 18  Level of Consciousness Alert  SpO2 100 %  O2 Device Room Air  Assess: MEWS Score  MEWS Temp 2  MEWS Systolic 0  MEWS Pulse 0  MEWS RR 0  MEWS LOC 0  MEWS Score 2  MEWS Score Color Yellow  Assess: if the MEWS score is Yellow or Red  Were vital signs taken at a resting state? Yes  Focused Assessment Change from prior assessment (see assessment flowsheet)  Early Detection of Sepsis Score *See Row Information* High  MEWS guidelines implemented *See Row Information* No, previously yellow, continue vital signs every 4 hours  Treat  MEWS Interventions Administered prn meds/treatments (Tylenol given)  Escalate  MEWS: Escalate Yellow: discuss with charge nurse/RN and consider discussing with provider and RRT  Notify: Charge Nurse/RN  Name of Charge Nurse/RN Notified Nunn, RN  Date Charge Nurse/RN Notified 06/04/20  Time Charge Nurse/RN Notified 2030  Notify: Provider  Provider Name/Title Rathore, MD  Date Provider Notified 06/04/20  Time Provider Notified 2037  Notification Type Page  Notification Reason Change in status  Provider response See new orders  Document  Patient Outcome Stabilized after interventions  Progress note created (see row info) Yes

## 2020-06-05 ENCOUNTER — Inpatient Hospital Stay (HOSPITAL_COMMUNITY): Payer: Medicaid Other

## 2020-06-05 DIAGNOSIS — R509 Fever, unspecified: Secondary | ICD-10-CM

## 2020-06-05 DIAGNOSIS — J869 Pyothorax without fistula: Secondary | ICD-10-CM

## 2020-06-05 DIAGNOSIS — E43 Unspecified severe protein-calorie malnutrition: Secondary | ICD-10-CM

## 2020-06-05 LAB — CBC WITH DIFFERENTIAL/PLATELET
Abs Immature Granulocytes: 0.26 10*3/uL — ABNORMAL HIGH (ref 0.00–0.07)
Basophils Absolute: 0 10*3/uL (ref 0.0–0.1)
Basophils Relative: 0 %
Eosinophils Absolute: 0.1 10*3/uL (ref 0.0–0.5)
Eosinophils Relative: 1 %
HCT: 25.6 % — ABNORMAL LOW (ref 39.0–52.0)
Hemoglobin: 8.8 g/dL — ABNORMAL LOW (ref 13.0–17.0)
Immature Granulocytes: 1 %
Lymphocytes Relative: 3 %
Lymphs Abs: 0.6 10*3/uL — ABNORMAL LOW (ref 0.7–4.0)
MCH: 32.5 pg (ref 26.0–34.0)
MCHC: 34.4 g/dL (ref 30.0–36.0)
MCV: 94.5 fL (ref 80.0–100.0)
Monocytes Absolute: 1.4 10*3/uL — ABNORMAL HIGH (ref 0.1–1.0)
Monocytes Relative: 6 %
Neutro Abs: 19.1 10*3/uL — ABNORMAL HIGH (ref 1.7–7.7)
Neutrophils Relative %: 89 %
Platelets: 786 10*3/uL — ABNORMAL HIGH (ref 150–400)
RBC: 2.71 MIL/uL — ABNORMAL LOW (ref 4.22–5.81)
RDW: 13.5 % (ref 11.5–15.5)
WBC: 21.4 10*3/uL — ABNORMAL HIGH (ref 4.0–10.5)
nRBC: 0 % (ref 0.0–0.2)

## 2020-06-05 LAB — CULTURE, BLOOD (ROUTINE X 2)
Culture: NO GROWTH
Culture: NO GROWTH
Special Requests: ADEQUATE

## 2020-06-05 LAB — BASIC METABOLIC PANEL
Anion gap: 7 (ref 5–15)
BUN: 12 mg/dL (ref 6–20)
CO2: 27 mmol/L (ref 22–32)
Calcium: 7.6 mg/dL — ABNORMAL LOW (ref 8.9–10.3)
Chloride: 100 mmol/L (ref 98–111)
Creatinine, Ser: 1.01 mg/dL (ref 0.61–1.24)
GFR, Estimated: >60 mL/min (ref >60)
Glucose, Bld: 105 mg/dL — ABNORMAL HIGH (ref 70–99)
Potassium: 4.3 mmol/L (ref 3.5–5.1)
Sodium: 134 mmol/L — ABNORMAL LOW (ref 135–145)

## 2020-06-05 LAB — CULTURE, BODY FLUID W GRAM STAIN -BOTTLE: Culture: NO GROWTH

## 2020-06-05 LAB — PROCALCITONIN: Procalcitonin: 0.84 ng/mL

## 2020-06-05 LAB — MAGNESIUM: Magnesium: 1.9 mg/dL (ref 1.7–2.4)

## 2020-06-05 NOTE — Progress Notes (Signed)
Occupational Therapy Treatment Patient Details Name: Edward Mueller MRN: [id number] DOB: [birth year] Today's Date: 06/05/2020    History of present illness Pt is a 50 y.o. 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 Chest CT shows multiple peripheral infiltrates consistent with embolic phenomena; concern for endocarditis. Workup for compensated septic shock due to LUE cellulitis, MSSA bactermia of unclear source.  S/p TEE 4/6 with no evidence of endocarditis. UDS (+) THC. Pt with loculated R pleural effusion s/p R chest tube placemtn 4/9. S/p L thoracentesis 4/10. S/p chest tube removal 4/12. No reported PMH; pt reports donating plasma 2x/wk.   OT comments  Pt donned his pants, completed two activities standing at sink and toileted with set up to min assist. Verbal cues for pacing. HR 110-119, Sp02 94-97% on RA.  Follow Up Recommendations  Home health OT    Equipment Recommendations  None recommended by OT    Recommendations for Other Services      Precautions / Restrictions Precautions Precautions: Fall Precaution Comments: SOB with minimal exertion       Mobility Bed Mobility Overal bed mobility: Modified Independent                  Transfers Overall transfer level: Needs assistance Equipment used: Rolling walker (2 wheeled) Transfers: Sit to/from Stand Sit to Stand: Supervision         General transfer comment: from bed and toilet    Balance Overall balance assessment: Needs assistance   Sitting balance-Leahy Scale: Good     Standing balance support: No upper extremity supported Standing balance-Leahy Scale: Fair Standing balance comment: at sink                           ADL either performed or assessed with clinical judgement   ADL Overall ADL's : Needs assistance/impaired     Grooming: Wash/dry hands;Oral  care;Standing;Supervision/safety               Lower Body Dressing: Set up;Bed level   Toilet Transfer: Supervision/safety;Ambulation;RW   Toileting- Clothing Manipulation and Hygiene: Minimal assistance;Sit to/from stand       Functional mobility during ADLs: Supervision/safety;Rolling walker       Vision       Perception     Praxis      Cognition Arousal/Alertness: Awake/alert Behavior During Therapy: WFL for tasks assessed/performed Overall Cognitive Status: Within Functional Limits for tasks assessed                                 General Comments: improved anxiety, cues for pacing        Exercises     Shoulder Instructions       General Comments      Pertinent Vitals/ Pain       Pain Assessment: No/denies pain  Home Living                                          Prior Functioning/Environment              Frequency  Min 2X/week        Progress Toward Goals  OT Goals(current goals can now be found in the care plan section)  Progress towards OT goals: Progressing toward goals  Acute Rehab OT Goals Patient Stated Goal: Breathe better OT Goal Formulation: With patient Time For Goal Achievement: 06/18/20 Potential to Achieve Goals: Good  Plan Discharge plan remains appropriate    Co-evaluation                 AM-PAC OT "6 Clicks" Daily Activity     Outcome Measure   Help from another person eating meals?: None Help from another person taking care of personal grooming?: A Little Help from another person toileting, which includes using toliet, bedpan, or urinal?: A Little Help from another person bathing (including washing, rinsing, drying)?: A Little Help from another person to put on and taking off regular upper body clothing?: None Help from another person to put on and taking off regular lower body clothing?: None 6 Click Score: 21    End of Session Equipment Utilized During  Treatment: Rolling walker  OT Visit Diagnosis: Unsteadiness on feet (R26.81);Pain;Muscle weakness (generalized) (M62.81)   Activity Tolerance Patient tolerated treatment well   Patient Left in bed;with call bell/phone within reach;with family/visitor present   Nurse Communication          Time: 7517-0017 OT Time Calculation (min): 26 min  Charges: OT General Charges $OT Visit: 1 Visit OT Treatments $Self Care/Home Management : 23-37 mins  Martie Round, OTR/L Acute Rehabilitation Services Pager: [phone] Office: [phone]   Evern Bio 06/05/2020, 2:54 PM

## 2020-06-05 NOTE — Progress Notes (Signed)
Physical Therapy Treatment Patient Details Name: Edward Mueller MRN: [id number] DOB: [birth year] Today's Date: 06/05/2020    History of Present Illness Pt is a 50 y.o. 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 Chest CT shows multiple peripheral infiltrates consistent with embolic phenomena; concern for endocarditis. Workup for compensated septic shock due to LUE cellulitis, MSSA bactermia of unclear source. S/p TEE 4/6 with no evidence of endocarditis. UDS (+) THC. Pt with loculated R pleural effusion s/p R chest tube placement 4/9. S/p L thoracentesis 4/10. S/p chest tube removal 4/12. No reported PMH; pt reports donating plasma 2x/wk.   PT Comments    Pt slowly progressing with mobility; requires encouragement to mobilize. Pt tolerated increased gait distance this session, walking 84' with RW and supervision for safety; this is the furthest distance pt has walked consecutively since admission. Pt still requiring intermittent cues for pacing and pursed lip breathing. Reviewed educ re: activity recommendations, importance of mobility; pt still not consistently mobilizing OOB outside of PT/OT sessions. Will continue to follow acutely.  SpO2 92-97% on RA    Follow Up Recommendations  Home health PT     Equipment Recommendations  Rolling walker with 5" wheels    Recommendations for Other Services       Precautions / Restrictions Precautions Precautions: Fall Precaution Comments: SOB with minimal exertion Restrictions Weight Bearing Restrictions: No    Mobility  Bed Mobility Overal bed mobility: Modified Independent Bed Mobility: Supine to Sit                Transfers Overall transfer level: Needs assistance Equipment used: Rolling walker (2 wheeled) Transfers: Sit to/from Stand Sit to Stand: Supervision         General transfer comment: from bed and  toilet  Ambulation/Gait Ambulation/Gait assistance: Supervision Gait Distance (Feet): 88 Feet Assistive device: Rolling walker (2 wheeled) Gait Pattern/deviations: Step-through pattern;Decreased stride length;Trunk flexed Gait velocity: Decreased Gait velocity interpretation: <1.31 ft/sec, indicative of household ambulator General Gait Details: Slow, fatigued gait with RW and supervision for safety; reduced foot clearance; 3x standing rest breaks secondary to SOB; cues for pursed lip breathing   Stairs             Wheelchair Mobility    Modified Rankin (Stroke Patients Only)       Balance Overall balance assessment: Needs assistance Sitting-balance support: No upper extremity supported;Feet supported Sitting balance-Leahy Scale: Good     Standing balance support: No upper extremity supported Standing balance-Leahy Scale: Fair Standing balance comment: can static stand and take steps without UE support                            Cognition Arousal/Alertness: Awake/alert Behavior During Therapy: WFL for tasks assessed/performed;Flat affect Overall Cognitive Status: Within Functional Limits for tasks assessed                                 General Comments: improved anxiety, cues for pacing      Exercises      General Comments General comments (skin integrity, edema, etc.): Mother present. Difficulty getting reliable SpO2 reading with ambulation; when reliable reading, SpO2 92-97% on RA, HR 96. Reviewed educ re: activity recommendations (more frequent OOB with nursing staff, pt still not doing this), pursed lip breathing, activity pacing, importance of mobility  Pertinent Vitals/Pain Pain Assessment: No/denies pain Pain Intervention(s): Monitored during session    Home Living                      Prior Function            PT Goals (current goals can now be found in the care plan section) Acute Rehab PT Goals Patient  Stated Goal: Breathe better Progress towards PT goals: Progressing toward goals    Frequency    Min 3X/week      PT Plan Current plan remains appropriate    Co-evaluation              AM-PAC PT "6 Clicks" Mobility   Outcome Measure  Help needed turning from your back to your side while in a flat bed without using bedrails?: None Help needed moving from lying on your back to sitting on the side of a flat bed without using bedrails?: None Help needed moving to and from a bed to a chair (including a wheelchair)?: A Little Help needed standing up from a chair using your arms (e.g., wheelchair or bedside chair)?: A Little Help needed to walk in hospital room?: A Little Help needed climbing 3-5 steps with a railing? : A Little 6 Click Score: 20    End of Session Equipment Utilized During Treatment: Gait belt Activity Tolerance: Patient tolerated treatment well;Patient limited by fatigue Patient left: in chair;with call bell/phone within reach;with family/visitor present Nurse Communication: Mobility status PT Visit Diagnosis: Other abnormalities of gait and mobility (R26.89);Other (comment)     Time: 8502-7741 PT Time Calculation (min) (ACUTE ONLY): 20 min  Charges:  $Gait Training: 8-22 mins                     Ina Homes, PT, DPT Acute Rehabilitation Services  Pager [phone] Office [phone]  Malachy Chamber 06/05/2020, 4:38 PM

## 2020-06-05 NOTE — TOC Initial Note (Signed)
Transition of Care [facility]) - Initial/Assessment Note    Patient Details  Name: Edward Mueller MRN: [id number] Date of Birth: 1970/12/06  Transition of Care [facility]) CM/SW Contact:    Gala Lewandowsky, RN Phone Number: 06/05/2020, 9:50 AM  Clinical Narrative:  Case Manager received referral for home health services. Patient is without insurance, he was working prior to hospitalization; however, not able to work right now. Patient has been set up at the [facility] for PCP needs and pharmacy assistance outpatient. Case Manager was unable to get charity care via [facility]- no nursing services available. Case Manager had to call the Lead CSW to see if letter of guarantee (LOG) was appropriate. Case Manager was able to secure [facility] for only [facility] RN via LOG. Start of care will start on Monday- 06-08-20 that afternoon. Family and patient is aware. IV antibiotics will be provided by [facility] and education has been initiated with the patient and sister. Sister states that her an other family members will assume care for the patient. Patient will have private transportation home. Case Manager will make [facility] aware when patient is stable to transition home.                Expected Discharge Plan: Home w Home Health Services Barriers to Discharge: Continued Medical Work up   Patient Goals and CMS Choice Patient states their goals for this hospitalization and ongoing recovery are:: to return home once stable   Choice offered to / list presented to : NA  Expected Discharge Plan and Services Expected Discharge Plan: Home w Home Health Services In-house Referral: NA Discharge Planning Services: CM Consult Post Acute Care Choice: Home Health Living arrangements for the past 2 months: Single Family Home                           HH Arranged: RN HH Agency: Other - See comment ([facility]) Date HH Agency Contacted: 06/04/20 Time HH Agency Contacted: 1600 Representative spoke with at [facility] Agency:  Revonda Standard  Prior Living Arrangements/Services Living arrangements for the past 2 months: Single Family Home Lives with:: Parents Patient language and need for interpreter reviewed:: Yes Do you feel safe going back to the place where you live?: Yes      Need for Family Participation in Patient Care: Yes (Comment) Care giver support system in place?: Yes (comment)   Criminal Activity/Legal Involvement Pertinent to Current Situation/Hospitalization: No - Comment as needed  Activities of Daily Living Home Assistive Devices/Equipment: None ADL Screening (condition at time of admission) Patient's cognitive ability adequate to safely complete daily activities?: Yes Is the patient deaf or have difficulty hearing?: No Does the patient have difficulty seeing, even when wearing glasses/contacts?: No Does the patient have difficulty concentrating, remembering, or making decisions?: No Patient able to express need for assistance with ADLs?: No Does the patient have difficulty dressing or bathing?: No Independently performs ADLs?: Yes (appropriate for developmental age) Does the patient have difficulty walking or climbing stairs?: No Weakness of Legs: None Weakness of Arms/Hands: None  Permission Sought/Granted Permission sought to share information with : Family Environmental health practitioner Permission granted to share information with : No     Permission granted to share info w AGENCY: [facility]        Emotional Assessment Appearance:: Appears stated age Attitude/Demeanor/Rapport: Engaged Affect (typically observed): Appropriate Orientation: : Oriented to Situation,Oriented to  Time,Oriented to Place,Oriented to Self Alcohol / Substance Use: Not  Applicable Psych Involvement: No (comment)  Admission diagnosis:  SOB (shortness of breath) [R06.02] 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 Protein-calorie malnutrition, severe 06/03/2020  . Shortness of breath   . Encounter for chest tube removal   . AKI (acute kidney injury) (HCC)   . Chest tube in place   . Hypoxia   . Pleural effusion   . S/P thoracentesis   . Acute respiratory failure with hypoxia (HCC)   . Bacteremia   . 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 [place] [address] Phone: [phone] Fax: [phone]  Readmission Risk Interventions No flowsheet data found.

## 2020-06-05 NOTE — Progress Notes (Addendum)
NAME:  Edward Mueller, MRN:  [id number], DOB:  1970-07-29, LOS: 12 ADMISSION DATE:  05/24/2020, CONSULTATION DATE:  05/24/2020 REFERRING MD:  Lockie Mola, CHIEF COMPLAINT:  Dyspnea   History of Present Illness:  50 year old man who presented to [facility] on 4/3 with c/o dyspnea.    Reportedly was in his usual state of health until 48 hours prior to admit. 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  On 4/1 he donated plasma.  After that he woke with a "knot on his arm" and felt poorly.  He had malaise, N/V and SOB with associated central chest pain.  He began coughing up bloody sputum.  He was found to have sepsis in the setting of left arm cellulitis, extensive PNA, MSSA bacteremia & AKI.  TEE negative for endocarditis.   Now s/p R chest tube and L thoracentesis performed by IR.   Pertinent  Medical History  Tobacco / THC use  Significant Hospital Events: Including procedures, antibiotic start and stop dates in addition to other pertinent events    4/3 Admit with dyspnea, febrile with elevated lactate, imaging consistent with PNA, L arm cellulitis. Negative HIV, COVID, Flu. Cultures positive for MSSA.   4/4 To [facility]   4/6 TEE negative for vegetation   4/7 PCCM called back for increased O2 needs. O2 weaned to 3L on exam, sats 97%. VBG 7.4 / 42s  4/9 chest CT revealed bilateral pleural effusions with appearance of loculation, IR consulted and placed right pigtail chest tube patient received first dose of TPA/dornase  4/10  2L removed via chest tube since insertion.  Second dose TPA dornase administered  4/10 L thoracentesis performed by IR yielding 550 mL dark red fluid, GS neg, pleural cx pending  4/11 another chest tube drainage, WBC trending 44->31.5  4/12 right chest tube removed due to low output, PCT 7.37-> 2.85   Interim History / Subjective:  Febrile 101.5 yesterday, defervesced this morning Denies chest pain or  dyspnea Saturation 98% on room air   Objective   Blood pressure 129/80, pulse (!) 102, temperature 99.5 F (37.5 C), temperature source Oral, resp. rate 18, height 6\' 3"  (1.905 m), weight 66.1 kg, SpO2 98 %.        Intake/Output Summary (Last 24 hours) at 06/05/2020 0831 Last data filed at 06/05/2020 0439 Gross per 24 hour  Intake --  Output 650 ml  Net -650 ml   Filed Weights   05/29/20 0500 06/01/20 0405 06/04/20 06/06/20  Weight: 68.9 kg 70.2 kg 66.1 kg    Examination: General:  Chronically ill, thin appearing male sitting in bed in NAD HEENT: MM pink/moist Neuro: AOx4, MAE CV: rr, NSR PULM: No accessory muscle use, speaks in monosyllables, decreased breath sounds on right, no rhonchi, brownish sputum that he spits in a cup GI: soft, bs active, NT  Extremities: warm/dry, no LE edema , no clubbing Skin: no rashes , no nailbed hemorrhages   Labs/imaging that I havepersonally reviewed  (right click and "Reselect all SmartList Selections" daily)  CXR personally reviewed 4/14 > bilateral asymmetric pulmonary infiltrate, worse on the right.  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 - IV cefazolin per ID for 6 weeks for , leukocytosis decreasing but fevers persist -If white count trends back up or fevers persist, can consider repeat CT imaging to evaluate pleural space again -discussed with hospitalist and ID  Best practice (right click and "Reselect all SmartList Selections" daily)   Per primary  Labs   CBC: Recent Labs  Lab 05/31/20 0124 06/01/20 0239 06/02/20 0422 06/04/20 0309 06/05/20 0500  WBC 45.1* 44.2* 31.5* 23.2* 21.4*  NEUTROABS 37.6* 38.3* 27.7*  --  19.1*  HGB 12.6* 11.6* 10.1* 9.2* 8.8*  HCT 36.0* 33.9* 28.6* 26.8* 25.6*  MCV 94.2 93.1 93.8 94.4 94.5  PLT 334 422* 514* 693* 786*    Basic Metabolic Panel: Recent Labs  Lab 05/30/20 0209 05/31/20 0124 06/01/20 0239 06/02/20 0422 06/03/20 0145 06/04/20 0309 06/05/20 0500  NA 136   < > 131* 129* 130* 134* 134*  K 3.5   < > 3.7 3.7 3.6 3.9 4.3  CL 102   < > 98 94* 95* 99 100  CO2 28   < > 27 27 28 26 27   GLUCOSE 111*   < > 123* 104* 98 103* 105*  BUN 38*   < > 39* 29* 20 16 12   CREATININE 1.41*   < > 1.38* 1.16 1.08 1.00 1.01  CALCIUM 7.4*   < > 7.1* 7.1* 7.2* 7.3* 7.6*  MG 2.7*  --   --  2.1 2.1 1.9 1.9  PHOS 2.4*  --   --  2.9 3.2  --   --    < > = values in this interval not displayed.   GFR: Estimated Creatinine Clearance: 80.9 mL/min (by C-G formula based on SCr of 1.01 mg/dL). Recent Labs  Lab 05/31/20 0124 06/01/20 0239 06/02/20 0422 06/04/20 0309 06/05/20 0500  PROCALCITON 7.37  --  2.85  --   --   WBC 45.1* 44.2* 31.5* 23.2* 21.4*   06/06/20 MD. FCCP. [facility] Pager : [phone]  If no response to pager , please call [phone] until 7 pm After 7:00 pm call Elink  [phone]    06/05/2020, 8:31 AM

## 2020-06-05 NOTE — Progress Notes (Signed)
[facility]  Date of Admission:  05/24/2020     Total days of antibiotics 12         ASSESSMENT:  Edward Mueller continues to remain febrile with stable leukocytosis. May need to consider re-imaging to see if there any additional or recurrent fluid collections. This remains a likely complication from endocarditis. New blood cultures drawn on 4/14 without growth in <12 hours. Continue current dose of Cefazolin and monitor fever curves and respiratory status.   PLAN:  1. Continue cefazolin. 2. Monitor fever curves and repeat blood cultures. 3. Consider re-imaging if fevers persist.  Dr. Elinor Parkinson is available over the weekend for any ID related questions/concerns.   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 Chlorhexidine Gluconate Cloth  6 each Topical Daily  . feeding supplement  237 mL Oral BID BM  . heparin  5,000 Units Subcutaneous Q8H  . multivitamin with minerals  1 tablet Oral Daily  . sodium chloride flush  10 mL Intracatheter Q8H  . sodium chloride flush  10-40 mL Intracatheter Q12H    SUBJECTIVE:  Continues to be febrile with max temperature 102.3 with stable leukocytosis. Breathing is okay. No new complaints.   No Known Allergies   Review of Systems: Review of Systems  Constitutional: Negative for chills, fever and weight loss.  Respiratory: Positive for cough and shortness of breath. Negative for wheezing.   Cardiovascular: Negative for chest pain and leg swelling.  Gastrointestinal: Negative for abdominal pain, constipation, diarrhea, nausea and vomiting.  Skin: Negative for rash.      OBJECTIVE: Vitals:    06/05/20 0437 06/05/20 0736 06/05/20 1124 06/05/20 1125  BP: 111/62 129/80 127/87 127/87  Pulse: 99 (!) 102 96 96  Resp:  18 (!) 22 (!) 22  Temp: 100.2 F (37.9 C) 99.5 F (37.5 C) (!) 101.3 F (38.5 C) (!) 101.3 F (38.5 C)  TempSrc: Oral Oral Oral   SpO2: 100% 98% 100%   Weight:      Height:       Body mass index is 18.21 kg/m.  Physical Exam Constitutional:      General: He is not in acute distress.    Appearance: He is well-developed.  Cardiovascular:     Rate and Rhythm: Normal rate and regular rhythm.     Heart sounds: Normal heart sounds.  Pulmonary:     Effort: Pulmonary effort is normal.     Breath sounds: Normal breath sounds.  Skin:    General: Skin is warm and dry.  Neurological:     Mental Status: He is alert and oriented to person, place, and time.  Psychiatric:        Behavior: Behavior normal.        Thought Content: Thought content normal.        Judgment: Judgment normal.     Lab Results Lab Results  Component Value Date   WBC 21.4 (H) 06/05/2020   HGB 8.8 (L) 06/05/2020   HCT 25.6 (L) 06/05/2020   MCV 94.5 06/05/2020   PLT 786 (H) 06/05/2020    Lab Results  Component Value Date  CREATININE 1.01 06/05/2020   BUN 12 06/05/2020   NA 134 (L) 06/05/2020   K 4.3 06/05/2020   CL 100 06/05/2020   CO2 27 06/05/2020    Lab Results  Component Value Date   ALT 26 05/31/2020   AST 25 05/31/2020   ALKPHOS 54 05/31/2020   BILITOT 0.7 05/31/2020     Microbiology: Recent Results (from the past 240 hour(s))  Expectorated Sputum Assessment w Gram Stain, Rflx to Resp Cult     Status: None   Collection Time: 05/29/20  7:11 AM   Specimen: Expectorated Sputum  Result Value Ref Range Status   Specimen Description EXPECTORATED SPUTUM  Final   Special Requests Normal  Final   Sputum evaluation   Final    THIS SPECIMEN IS ACCEPTABLE FOR SPUTUM CULTURE Performed at [facility] Lab, [address], [place], [place] [address]    Report Status  05/29/2020 FINAL  Final  Culture, Respiratory w Gram Stain     Status: None   Collection Time: 05/29/20  7:11 AM  Result Value Ref Range Status   Specimen Description EXPECTORATED SPUTUM  Final   Special Requests Normal Reflexed from [id number]  Final   Gram Stain   Final    ABUNDANT WBC PRESENT,BOTH PMN AND MONONUCLEAR FEW SQUAMOUS EPITHELIAL CELLS PRESENT ABUNDANT GRAM NEGATIVE RODS ABUNDANT GRAM POSITIVE COCCI FEW YEAST    Culture   Final    FEW Consistent with normal respiratory flora. No Pseudomonas species isolated Performed at [facility] Lab, [address], [place], [place] [address]    Report Status 06/01/2020 FINAL  Final  Culture, blood (routine x 2)     Status: None   Collection Time: 05/31/20  8:07 AM   Specimen: BLOOD LEFT HAND  Result Value Ref Range Status   Specimen Description BLOOD LEFT HAND  Final   Special Requests   Final    BOTTLES DRAWN AEROBIC AND ANAEROBIC Blood Culture adequate volume   Culture   Final    NO GROWTH 5 DAYS Performed at [facility] Lab, [address] [place], [place] [address]    Report Status 06/05/2020 FINAL  Final  Culture, blood (routine x 2)     Status: None   Collection Time: 05/31/20  8:12 AM   Specimen: BLOOD RIGHT HAND  Result Value Ref Range Status   Specimen Description BLOOD RIGHT HAND  Final   Special Requests   Final    BOTTLES DRAWN AEROBIC ONLY Blood Culture results may not be optimal due to an inadequate volume of blood received in culture bottles   Culture   Final    NO GROWTH 5 DAYS Performed at [facility] Lab, [address] [place], [place] [address]    Report Status 06/05/2020 FINAL  Final  Gram stain     Status: None   Collection Time: 05/31/20 11:52 AM   Specimen: Fluid  Result Value Ref Range Status   Specimen Description FLUID  Final   Special Requests PLEURAL FLUID LEFT  Final   Gram Stain   Final    FEW WBC PRESENT, PREDOMINANTLY PMN NO ORGANISMS SEEN Performed at [facility]  Lab, [address] [place], [place] [address]    Report Status 05/31/2020 FINAL  Final  Culture, body fluid w Gram Stain-bottle     Status: None   Collection Time: 05/31/20 11:52 AM   Specimen: Fluid  Result Value Ref Range Status   Specimen Description FLUID  Final   Special Requests PLEURAL FLUID LEFT  Final   Culture   Final    NO GROWTH 5 DAYS Performed at [facility] Lab, [address], [place], [place] [address]    Report Status 06/05/2020 FINAL  Final  Culture, blood (routine x 2)     Status: None (Preliminary result)   Collection Time: 06/04/20  9:54 PM   Specimen: BLOOD LEFT HAND  Result Value Ref Range Status   Specimen Description BLOOD LEFT HAND  Final   Special Requests   Final    BOTTLES DRAWN AEROBIC AND ANAEROBIC Blood Culture adequate volume   Culture   Final    NO GROWTH < 12 HOURS Performed at [facility] Lab, [address] [place], [place] [address]    Report Status PENDING  Incomplete  Culture, blood (routine x 2)     Status: None (Preliminary result)   Collection Time: 06/04/20 10:07 PM   Specimen: BLOOD LEFT HAND  Result Value Ref Range Status   Specimen Description BLOOD LEFT HAND  Final   Special Requests   Final    BOTTLES DRAWN AEROBIC AND ANAEROBIC Blood Culture adequate volume   Culture   Final    NO GROWTH < 12 HOURS Performed at [facility] Lab, [address], [place], [place] [address]    Report Status PENDING  Incomplete     Marcos Eke, NP [facility]  06/05/2020  2:31 PM

## 2020-06-05 NOTE — Progress Notes (Signed)
Pt resting comfortably SPo2 94 Hr 94 RR 18 No bipap needed at this time.

## 2020-06-05 NOTE — Progress Notes (Signed)
Progress Note    Ewing Fandino   ZOX:[id number]  DOB: [birth year]  DOA: 05/24/2020     12  PCP: Pcp, No  CC: "feeling sick"  Hospital Course: 50 year old gentleman with no reported medical problems who donates plasma 2 times a week, donated plasma on 05/22/20 and after about 24 hours he started not feeling well.  Reported symptoms included generalized malaise, nausea, vomiting, shortness of breath, pleuritic chest pain, cough with blood-tinged sputum. Came to the emergency room on 05/24/20, was febrile, tachypneic with lactic acidosis.  Found to have multilobular pneumonia on chest x-ray, left upper extremity cellulitis and left acute superficial vein thrombosis involving the left cephalic vein on 09/21/1912.  Was started on IV antibiotics and IV fluids and admitted to ICU for closer monitoring then later transferred to progressive care unit.  Work-up revealed MSSA bacteremia for which infectious disease was consulted.  No vegetation was seen on TEE on 05/27/2020.  Sputum culture from 05/29/2020 growing abundant gram-negative rods, abundant gram-positive cocci with few yeast, culture was reincubated for better growth.  He has been on cefazolin for MSSA bacteremia as recommended by infectious disease.  Repeated CT chest without contrast done on 05/30/2020 showed interval worsening of the aeration of the lungs.  IR was consulted for thoracentesis and chest tube placement due to loculated pleural effusion.   Post right thoracentesis by IR on 05/30/2020 with approximately 300 cc translucent, serosanguineous fluid removed and right chest tube placement.  Post left thoracentesis by IR on 05/31/2020 with 550 cc dark red fluid removed.  Chest tube is managed by PCCM.  Right-sided chest tube was removed on 06/02/2020 with no reaccumulation of fluid.   Interval History:  No events overnight. Mother bedside this am. Trying to use IS, still only ~250cc.  Continues to have intermittent fevers since yesterday.  Blood  cultures were obtained last night as well with ongoing fevers.  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 No evidence of valvular vegetation on TEE. Appreciate infectious disease and PCCM assistance. He is currently on IV cefazolin, continue as recommended by infectious disease.   - Plan is for 6 weeks treatment, end date 07/06/2020 - recurrent fevers, BCx were repeated on 06/04/20: continue following - BCx from 4/4 and 4/10 are negative - continue trending WBC and fever curve -PICC line placed 06/04/2020 - continue IS  Acute hypoxic respiratory failure 2/2 PNA Loculated right parapneumonic effusion, simple left parapneumonic effusion, and extensive bilateral septic pulmonary emboli On IV antibiotics as stated above. Post left and right thoracentesis and right chest tube placement by IR. -Right chest tube removed on 06/02/2020.  No significant reaccumulation on CXR obtained on 06/03/2020 and 06/05/20 - s/p right chest tube removed 06/02/20 Continue to maintain O2 saturation greater than 90%. Now on RA - continue IS  Severe protein calorie malnutrition due to severe critical illness Albumin 1.4 on 05/31/2020 BMI 19 Dietitian consulted for assessment of nutrition requirements Continue to encourage increase in oral protein calorie intake.  Resolved post repletion.  Hypocalcemia Calcium corrected for albumin 9.5.  Resolved Non anion gap metabolic acidosis  AKI on CKD 3B, improving. Presented with creatinine of 2.7 Creatinine downtrending Continue to monitor urine output  Hypovolemic hyponatremia Encourage increase in oral intake - stable   Repeat BMP in the morning  Hypokalemia  Repleted Repeat BMP in the  morning  Hypophosphatemia - repleted   Resolved left upper extremity cellulitis Initially presented with left upper extremity cellulitis which has been treated and has now resolved  Left upper extremity acute superficial vein thrombosis Continue to monitor  THC/tobacco use UDS positive for THC on 05/24/2020 Denies use of cocaine or intravenous drug abuse Cessation counseling when hemodynamically stable.  Generalized weakness in the setting of severe, critical illness Out of bed to chair with every shift as tolerated Ambulate patient with every shift as tolerated Increase oral protein calorie intake as tolerated PT assessed with no further recommendations. Continue fall precautions.  Old records reviewed in assessment of this patient  Antimicrobials: Ancef 05/25/20>>current   DVT prophylaxis: heparin injection 5,000 Units Start: 05/24/20 2330 SCDs Start: 05/24/20 2315   Code Status:   Code Status: Full Code Family Communication:   Disposition Plan: Status is: Inpatient  Remains inpatient appropriate because:IV treatments appropriate due to intensity of illness or inability to take PO and Inpatient level of care appropriate due to severity of illness   Dispo:  Patient From: Home  Planned Disposition: Home  Medically stable for discharge: No     Risk of unplanned readmission score: Unplanned Admission- Pilot do not use: 12.54   Objective: Blood pressure 127/87, pulse 96, temperature (!) 101.3 F (38.5 C), temperature source Oral, resp. rate (!) 22, height  (1.905 m), weight 66.1 kg, SpO2 100 %.  Examination: General appearance: alert, cooperative and no distress Head: Normocephalic, without obvious abnormality, atraumatic Eyes: EOMI Back: right chest tube site sore to palpation but no swelling or surrounding signs of infection Lungs: decreased right sided breath sounds, no wheezing Heart: regular rate and rhythm and S1, S2 normal Abdomen: normal findings:  bowel sounds normal and soft, non-tender Extremities: no edema Skin: mobility and turgor normal Neurologic: Grossly normal  Consultants:   ID  Radiology   Pulm  Procedures:   2D echo on 05/25/2020  TEE on 05/27/2020.  Chest tube placement, right, 05/30/20  Right chest tube removal on 06/02/2020.  Data Reviewed: I have personally reviewed following labs and imaging studies Results for orders placed or performed during the hospital encounter of 05/24/20 (from the past 24 hour(s))  Culture, blood (routine x 2)     Status: None (Preliminary result)   Collection Time: 06/04/20  9:54 PM   Specimen: BLOOD LEFT HAND  Result Value Ref Range   Specimen Description BLOOD LEFT HAND    Special Requests      BOTTLES DRAWN AEROBIC AND ANAEROBIC Blood Culture adequate volume   Culture      NO GROWTH < 12 HOURS Performed at [facility] Lab, [address], [place], [place] [address]    Report Status PENDING   Culture, blood (routine x 2)     Status: None (Preliminary result)   Collection Time: 06/04/20 10:07 PM   Specimen: BLOOD LEFT HAND  Result Value Ref Range   Specimen Description BLOOD LEFT HAND    Special Requests      BOTTLES DRAWN AEROBIC AND ANAEROBIC Blood Culture adequate volume   Culture      NO GROWTH < 12 HOURS Performed at [facility] Lab, [address] [place], [place] [address]    Report Status PENDING   Basic metabolic panel     Status: Abnormal   Collection Time: 06/05/20  5:00 AM  Result Value Ref Range   Sodium 134 (L) 135 - 145 mmol/L  Potassium 4.3 3.5 - 5.1 mmol/L   Chloride 100 98 - 111 mmol/L   CO2 27 22 - 32 mmol/L   Glucose, Bld 105 (H) 70 - 99 mg/dL   BUN 12 6 - 20 mg/dL   Creatinine, Ser 3.76 0.61 - 1.24 mg/dL   Calcium 7.6 (L) 8.9 - 10.3 mg/dL   GFR, Estimated >28 >31 mL/min   Anion gap 7 5 - 15  Magnesium     Status: None   Collection Time: 06/05/20  5:00 AM  Result Value Ref Range   Magnesium 1.9 1.7 - 2.4 mg/dL  CBC with  Differential/Platelet     Status: Abnormal   Collection Time: 06/05/20  5:00 AM  Result Value Ref Range   WBC 21.4 (H) 4.0 - 10.5 K/uL   RBC 2.71 (L) 4.22 - 5.81 MIL/uL   Hemoglobin 8.8 (L) 13.0 - 17.0 g/dL   HCT 51.7 (L) 61.6 - 07.3 %   MCV 94.5 80.0 - 100.0 fL   MCH 32.5 26.0 - 34.0 pg   MCHC 34.4 30.0 - 36.0 g/dL   RDW 71.0 62.6 - 94.8 %   Platelets 786 (H) 150 - 400 K/uL   nRBC 0.0 0.0 - 0.2 %   Neutrophils Relative % 89 %   Neutro Abs 19.1 (H) 1.7 - 7.7 K/uL   Lymphocytes Relative 3 %   Lymphs Abs 0.6 (L) 0.7 - 4.0 K/uL   Monocytes Relative 6 %   Monocytes Absolute 1.4 (H) 0.1 - 1.0 K/uL   Eosinophils Relative 1 %   Eosinophils Absolute 0.1 0.0 - 0.5 K/uL   Basophils Relative 0 %   Basophils Absolute 0.0 0.0 - 0.1 K/uL   Immature Granulocytes 1 %   Abs Immature Granulocytes 0.26 (H) 0.00 - 0.07 K/uL  Procalcitonin - Baseline     Status: None   Collection Time: 06/05/20  5:00 AM  Result Value Ref Range   Procalcitonin 0.84 ng/mL    Recent Results (from the past 240 hour(s))  Expectorated Sputum Assessment w Gram Stain, Rflx to Resp Cult     Status: None   Collection Time: 05/29/20  7:11 AM   Specimen: Expectorated Sputum  Result Value Ref Range Status   Specimen Description EXPECTORATED SPUTUM  Final   Special Requests Normal  Final   Sputum evaluation   Final    THIS SPECIMEN IS ACCEPTABLE FOR SPUTUM CULTURE Performed at [facility], [address], [place], [place] [address]    Report Status 05/29/2020 FINAL  Final  Culture, Respiratory w Gram Stain     Status: None   Collection Time: 05/29/20  7:11 AM  Result Value Ref Range Status   Specimen Description EXPECTORATED SPUTUM  Final   Special Requests Normal Reflexed from [id number]  Final   Gram Stain   Final    ABUNDANT WBC PRESENT,BOTH PMN AND MONONUCLEAR FEW SQUAMOUS EPITHELIAL CELLS PRESENT ABUNDANT GRAM NEGATIVE RODS ABUNDANT GRAM POSITIVE COCCI FEW YEAST    Culture   Final    FEW Consistent with  normal respiratory flora. No Pseudomonas species isolated Performed at [facility] Lab, [address] [place], [place] [address]    Report Status 06/01/2020 FINAL  Final  Culture, blood (routine x 2)     Status: None   Collection Time: 05/31/20  8:07 AM   Specimen: BLOOD LEFT HAND  Result Value Ref Range Status   Specimen Description BLOOD LEFT HAND  Final   Special Requests  Final    BOTTLES DRAWN AEROBIC AND ANAEROBIC Blood Culture adequate volume   Culture   Final    NO GROWTH 5 DAYS Performed at [facility], [address] [place], [place] [address]    Report Status 06/05/2020 FINAL  Final  Culture, blood (routine x 2)     Status: None   Collection Time: 05/31/20  8:12 AM   Specimen: BLOOD RIGHT HAND  Result Value Ref Range Status   Specimen Description BLOOD RIGHT HAND  Final   Special Requests   Final    BOTTLES DRAWN AEROBIC ONLY Blood Culture results may not be optimal due to an inadequate volume of blood received in culture bottles   Culture   Final    NO GROWTH 5 DAYS Performed at [facility], [address] [place], [place] [address]    Report Status 06/05/2020 FINAL  Final  Gram stain     Status: None   Collection Time: 05/31/20 11:52 AM   Specimen: Fluid  Result Value Ref Range Status   Specimen Description FLUID  Final   Special Requests PLEURAL FLUID LEFT  Final   Gram Stain   Final    FEW WBC PRESENT, PREDOMINANTLY PMN NO ORGANISMS SEEN Performed at [facility], [address] [place], [place] [address]    Report Status 05/31/2020 FINAL  Final  Culture, body fluid w Gram Stain-bottle     Status: None   Collection Time: 05/31/20 11:52 AM   Specimen: Fluid  Result Value Ref Range Status   Specimen Description FLUID  Final   Special Requests PLEURAL FLUID LEFT  Final   Culture   Final    NO GROWTH 5 DAYS Performed at [facility] Lab, [address] [place], [place] [address]    Report Status 06/05/2020 FINAL  Final   Culture, blood (routine x 2)     Status: None (Preliminary result)   Collection Time: 06/04/20  9:54 PM   Specimen: BLOOD LEFT HAND  Result Value Ref Range Status   Specimen Description BLOOD LEFT HAND  Final   Special Requests   Final    BOTTLES DRAWN AEROBIC AND ANAEROBIC Blood Culture adequate volume   Culture   Final    NO GROWTH < 12 HOURS Performed at [facility] Lab, [address] [place], [place] [address]    Report Status PENDING  Incomplete  Culture, blood (routine x 2)     Status: None (Preliminary result)   Collection Time: 06/04/20 10:07 PM   Specimen: BLOOD LEFT HAND  Result Value Ref Range Status   Specimen Description BLOOD LEFT HAND  Final   Special Requests   Final    BOTTLES DRAWN AEROBIC AND ANAEROBIC Blood Culture adequate volume   Culture   Final    NO GROWTH < 12 HOURS Performed at [facility] Lab, [address] [place], [place] [address]    Report Status PENDING  Incomplete     Radiology Studies: DG CHEST PORT 1 VIEW  Result Date: 06/05/2020 CLINICAL DATA:  Pleural effusion, dyspnea EXAM: PORTABLE CHEST 1 VIEW COMPARISON:  06/03/2020 FINDINGS: Bilateral asymmetric pulmonary infiltrate, more focal within the right mid lung zone peripherally and within the left lung base appears slightly progressive within the right lung. Numerous cystic lucencies are again identified in keeping with the multiple cavitary lesions noted on prior CT examination of 05/31/2020. No pneumothorax. Tiny bilateral pleural effusions are present. Cardiac size within  normal limits. Right upper extremity PICC line has been placed with its tip within the superior right atrium. Pulmonary vascularity is normal. No acute bone abnormality. IMPRESSION: Right upper extremity PICC line tip within the superior right atrium. Progressive multifocal pulmonary infiltrates, Electronically Signed   By: Helyn NumbersAshesh  Parikh MD   On: 06/05/2020 07:26   US EKG SITE RITE  Result Date: 06/03/2020 If Site  Rite image not attached, placement could not be confirmed due to current cardiac rhythm.  DG CHEST PORT 1 VIEW  Final Result    US EKG SITE RITE  Final Result    DG CHEST PORT 1 VIEW  Final Result    DG Chest Port 1 View  Final Result    DG CHEST PORT 1 VIEW  Final Result    CT CHEST WO CONTRAST  Final Result    DG Chest 1 View  Final Result    US THORACENTESIS ASP PLEURAL SPACE W/IMG GUIDE  Final Result    DG CHEST PORT 1 VIEW  Final Result    DG CHEST PORT 1 VIEW  Final Result    US THORACENTESIS ASP PLEURAL SPACE W/IMG GUIDE  Final Result    CT CHEST WO CONTRAST  Final Result    DG CHEST PORT 1 VIEW  Final Result    CT HEAD WO CONTRAST  Final Result    US RENAL  Final Result    DG CHEST PORT 1 VIEW  Final Result    VAS US UPPER EXTREMITY VENOUS DUPLEX  Final Result    CT Chest Wo Contrast  Final Result    DG Chest Port 1 View  Final Result      Scheduled Meds: . Chlorhexidine Gluconate Cloth  6 each Topical Daily  . feeding supplement  237 mL Oral BID BM  . heparin  5,000 Units Subcutaneous Q8H  . multivitamin with minerals  1 tablet Oral Daily  . sodium chloride flush  10 mL Intracatheter Q8H  . sodium chloride flush  10-40 mL Intracatheter Q12H   PRN Meds: acetaminophen, albuterol, docusate sodium, oxyCODONE, polyethylene glycol, sodium chloride flush Continuous Infusions: .  ceFAZolin (ANCEF) IV 2 g (06/05/20 0640)     LOS: 12 days  Time spent: Greater than 50% of the 35 minute visit was spent in counseling/coordination of care for the patient as laid out in the A&P.   Lewie Chamberavid Kendale Rembold, MD [facility] 06/05/2020, 1:45 PM

## 2020-06-06 DIAGNOSIS — J90 Pleural effusion, not elsewhere classified: Secondary | ICD-10-CM

## 2020-06-06 LAB — CBC WITH DIFFERENTIAL/PLATELET
Abs Immature Granulocytes: 0.21 10*3/uL — ABNORMAL HIGH (ref 0.00–0.07)
Basophils Absolute: 0 10*3/uL (ref 0.0–0.1)
Basophils Relative: 0 %
Eosinophils Absolute: 0.2 10*3/uL (ref 0.0–0.5)
Eosinophils Relative: 1 %
HCT: 25.2 % — ABNORMAL LOW (ref 39.0–52.0)
Hemoglobin: 8.5 g/dL — ABNORMAL LOW (ref 13.0–17.0)
Immature Granulocytes: 1 %
Lymphocytes Relative: 3 %
Lymphs Abs: 0.5 10*3/uL — ABNORMAL LOW (ref 0.7–4.0)
MCH: 32.1 pg (ref 26.0–34.0)
MCHC: 33.7 g/dL (ref 30.0–36.0)
MCV: 95.1 fL (ref 80.0–100.0)
Monocytes Absolute: 1.2 10*3/uL — ABNORMAL HIGH (ref 0.1–1.0)
Monocytes Relative: 7 %
Neutro Abs: 16.5 10*3/uL — ABNORMAL HIGH (ref 1.7–7.7)
Neutrophils Relative %: 88 %
Platelets: 825 10*3/uL — ABNORMAL HIGH (ref 150–400)
RBC: 2.65 MIL/uL — ABNORMAL LOW (ref 4.22–5.81)
RDW: 13.4 % (ref 11.5–15.5)
WBC: 18.6 10*3/uL — ABNORMAL HIGH (ref 4.0–10.5)
nRBC: 0 % (ref 0.0–0.2)

## 2020-06-06 LAB — HEPATIC FUNCTION PANEL
ALT: 36 U/L (ref 0–44)
AST: 48 U/L — ABNORMAL HIGH (ref 15–41)
Albumin: 1.4 g/dL — ABNORMAL LOW (ref 3.5–5.0)
Alkaline Phosphatase: 48 U/L (ref 38–126)
Bilirubin, Direct: <0.1 mg/dL (ref 0.0–0.2)
Total Bilirubin: 0.5 mg/dL (ref 0.3–1.2)
Total Protein: 4.7 g/dL — ABNORMAL LOW (ref 6.5–8.1)

## 2020-06-06 LAB — BASIC METABOLIC PANEL
Anion gap: 4 — ABNORMAL LOW (ref 5–15)
BUN: 11 mg/dL (ref 6–20)
CO2: 27 mmol/L (ref 22–32)
Calcium: 7.5 mg/dL — ABNORMAL LOW (ref 8.9–10.3)
Chloride: 103 mmol/L (ref 98–111)
Creatinine, Ser: 0.99 mg/dL (ref 0.61–1.24)
GFR, Estimated: >60 mL/min (ref >60)
Glucose, Bld: 99 mg/dL (ref 70–99)
Potassium: 4.4 mmol/L (ref 3.5–5.1)
Sodium: 134 mmol/L — ABNORMAL LOW (ref 135–145)

## 2020-06-06 LAB — MAGNESIUM: Magnesium: 1.7 mg/dL (ref 1.7–2.4)

## 2020-06-06 MED ORDER — MAGNESIUM OXIDE 400 (241.3 MG) MG PO TABS
800.0000 mg | ORAL_TABLET | Freq: Once | ORAL | Status: AC
  Administered 2020-06-06: 800 mg via ORAL
  Filled 2020-06-06: qty 2

## 2020-06-06 NOTE — Progress Notes (Signed)
Progress Note    Edward Mueller   ZOX:[id number]  DOB: 12-May-1970  DOA: 05/24/2020     13  PCP: Pcp, No  CC: "feeling sick"  Hospital Course: 50 year old gentleman with no reported medical problems who donates plasma 2 times a week, donated plasma on 05/22/20 and after about 24 hours he started not feeling well.  Reported symptoms included generalized malaise, nausea, vomiting, shortness of breath, pleuritic chest pain, cough with blood-tinged sputum. Came to the emergency room on 05/24/20, was febrile, tachypneic with lactic acidosis.  Found to have multilobular pneumonia on chest x-ray, left upper extremity cellulitis and left acute superficial vein thrombosis involving the left cephalic vein on 09/21/1912.  Was started on IV antibiotics and IV fluids and admitted to ICU for closer monitoring then later transferred to progressive care unit.  Work-up revealed MSSA bacteremia for which infectious disease was consulted.  No vegetation was seen on TEE on 05/27/2020.  Sputum culture from 05/29/2020 growing abundant gram-negative rods, abundant gram-positive cocci with few yeast, culture was reincubated for better growth.  He has been on cefazolin for MSSA bacteremia as recommended by infectious disease.  Repeated CT chest without contrast done on 05/30/2020 showed interval worsening of the aeration of the lungs.  IR was consulted for thoracentesis and chest tube placement due to loculated pleural effusion.   Post right thoracentesis by IR on 05/30/2020 with approximately 300 cc translucent, serosanguineous fluid removed and right chest tube placement.  Post left thoracentesis by IR on 05/31/2020 with 550 cc dark red fluid removed.  Chest tube is managed by PCCM.  Right-sided chest tube was removed on 06/02/2020 with no reaccumulation of fluid.   Interval History:  No events overnight.  Resting in bed laying on his right side this morning.  Still had some fevers since yesterday but overall curve seems to be  downtrending.  Breathing feels comfortable and he has been using the spirometer as much as he can tolerate.  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 No evidence of valvular vegetation on TEE. Appreciate infectious disease and PCCM assistance. He is currently on IV cefazolin, continue as recommended by infectious disease.   - Plan is for 6 weeks treatment, end date 07/06/2020 - recurrent fevers, BCx were repeated on 06/04/20: continue following - BCx from 4/4 and 4/10 are negative - continue trending WBC and fever curve (Tmax 101.3 since yesterday but overall will have to see if trend is coming down) -PICC line placed 06/04/2020 - continue IS  Acute hypoxic respiratory failure 2/2 PNA Septic pulmonary emboli with cavitating lesions  Loculated right parapneumonic effusion, simple left parapneumonic effusion On IV antibiotics as stated above. Post left and right thoracentesis and right chest tube placement by IR. -Right chest tube removed on 06/02/2020.  No significant reaccumulation on CXR obtained on 06/03/2020 and 06/05/20 - s/p right chest tube removed 06/02/20 Continue to maintain O2 saturation greater than 90%. Now on RA - continue IS  Severe protein calorie malnutrition due to severe critical illness Albumin 1.4 on 05/31/2020 BMI 19 Dietitian consulted for assessment of nutrition requirements Continue to encourage increase in oral protein calorie intake.  Resolved post repletion.  Hypocalcemia Calcium corrected for albumin 9.5.  Resolved Non anion gap metabolic acidosis  AKI on CKD 3B, improving. Presented with creatinine of  2.7 Creatinine downtrending Continue to monitor urine output  Hypovolemic  hyponatremia Encourage increase in oral intake - stable   Repeat BMP in the morning  Hypokalemia Repleted Repeat BMP in the morning  Hypophosphatemia - repleted   Resolved left upper extremity cellulitis Initially presented with left upper extremity cellulitis which has been treated and has now resolved  Left upper extremity acute superficial vein thrombosis Continue to monitor  THC/tobacco use UDS positive for THC on 05/24/2020 Denies use of cocaine or intravenous drug abuse Cessation counseling when hemodynamically stable.  Generalized weakness in the setting of severe, critical illness Out of bed to chair with every shift as tolerated Ambulate patient with every shift as tolerated Increase oral protein calorie intake as tolerated PT assessed with no further recommendations. Continue fall precautions.  Old records reviewed in assessment of this patient  Antimicrobials: Ancef 05/25/20>>current   DVT prophylaxis: heparin injection 5,000 Units Start: 05/24/20 2330 SCDs Start: 05/24/20 2315   Code Status:   Code Status: Full Code Family Communication:   Disposition Plan: Status is: Inpatient  Remains inpatient appropriate because:IV treatments appropriate due to intensity of illness or inability to take PO and Inpatient level of care appropriate due to severity of illness  Dispo:  Patient From: Home  Planned Disposition: Home  Medically stable for discharge: No     Risk of unplanned readmission score: Unplanned Admission- Pilot do not use: 11.58   Objective: Blood pressure 126/76, pulse 98, temperature 98.3 F (36.8 C), temperature source Oral, resp. rate 20, height 6\' 3"  (1.905 m), weight 64.3 kg, SpO2 97 %.  Examination: General appearance: alert, cooperative and no distress Head: Normocephalic, without obvious abnormality, atraumatic Eyes: EOMI Back: right chest tube site sore to palpation but no swelling or surrounding signs of infection Lungs:  improving but still diminished right sided breath sounds, no wheezing Heart: regular rate and rhythm and S1, S2 normal Abdomen: normal findings: bowel sounds normal and soft, non-tender Extremities: no edema Skin: mobility and turgor normal Neurologic: Grossly normal  Consultants:   ID  Radiology   Pulm  Procedures:   2D echo on 05/25/2020  TEE on 05/27/2020.  Chest tube placement, right, 05/30/20  Right chest tube removal on 06/02/2020.  Data Reviewed: I have personally reviewed following labs and imaging studies Results for orders placed or performed during the hospital encounter of 05/24/20 (from the past 24 hour(s))  Basic metabolic panel     Status: Abnormal   Collection Time: 06/06/20  5:00 AM  Result Value Ref Range   Sodium 134 (L) 135 - 145 mmol/L   Potassium 4.4 3.5 - 5.1 mmol/L   Chloride 103 98 - 111 mmol/L   CO2 27 22 - 32 mmol/L   Glucose, Bld 99 70 - 99 mg/dL   BUN 11 6 - 20 mg/dL   Creatinine, Ser 06/08/20 0.61 - 1.24 mg/dL   Calcium 7.5 (L) 8.9 - 10.3 mg/dL   GFR, Estimated 8.33 >82 mL/min   Anion gap 4 (L) 5 - 15  Magnesium     Status: None   Collection Time: 06/06/20  5:00 AM  Result Value Ref Range   Magnesium 1.7 1.7 - 2.4 mg/dL  CBC with Differential/Platelet     Status: Abnormal   Collection Time: 06/06/20  5:00 AM  Result Value Ref Range   WBC 18.6 (H) 4.0 - 10.5 K/uL   RBC 2.65 (L) 4.22 - 5.81 MIL/uL   Hemoglobin 8.5 (L) 13.0 - 17.0 g/dL   HCT  25.2 (L) 39.0 - 52.0 %   MCV 95.1 80.0 - 100.0 fL   MCH 32.1 26.0 - 34.0 pg   MCHC 33.7 30.0 - 36.0 g/dL   RDW 93.9 03.0 - 09.2 %   Platelets 825 (H) 150 - 400 K/uL   nRBC 0.0 0.0 - 0.2 %   Neutrophils Relative % 88 %   Neutro Abs 16.5 (H) 1.7 - 7.7 K/uL   Lymphocytes Relative 3 %   Lymphs Abs 0.5 (L) 0.7 - 4.0 K/uL   Monocytes Relative 7 %   Monocytes Absolute 1.2 (H) 0.1 - 1.0 K/uL   Eosinophils Relative 1 %   Eosinophils Absolute 0.2 0.0 - 0.5 K/uL   Basophils Relative 0 %   Basophils Absolute  0.0 0.0 - 0.1 K/uL   Immature Granulocytes 1 %   Abs Immature Granulocytes 0.21 (H) 0.00 - 0.07 K/uL  Hepatic function panel     Status: Abnormal   Collection Time: 06/06/20  5:00 AM  Result Value Ref Range   Total Protein 4.7 (L) 6.5 - 8.1 g/dL   Albumin 1.4 (L) 3.5 - 5.0 g/dL   AST 48 (H) 15 - 41 U/L   ALT 36 0 - 44 U/L   Alkaline Phosphatase 48 38 - 126 U/L   Total Bilirubin 0.5 0.3 - 1.2 mg/dL   Bilirubin, Direct <3.3 0.0 - 0.2 mg/dL   Indirect Bilirubin NOT CALCULATED 0.3 - 0.9 mg/dL    Recent Results (from the past 240 hour(s))  Expectorated Sputum Assessment w Gram Stain, Rflx to Resp Cult     Status: None   Collection Time: 05/29/20  7:11 AM   Specimen: Expectorated Sputum  Result Value Ref Range Status   Specimen Description EXPECTORATED SPUTUM  Final   Special Requests Normal  Final   Sputum evaluation   Final    THIS SPECIMEN IS ACCEPTABLE FOR SPUTUM CULTURE Performed at [facility], [address] [place], [place] [address]    Report Status 05/29/2020 FINAL  Final  Culture, Respiratory w Gram Stain     Status: None   Collection Time: 05/29/20  7:11 AM  Result Value Ref Range Status   Specimen Description EXPECTORATED SPUTUM  Final   Special Requests Normal Reflexed from [id number]  Final   Gram Stain   Final    ABUNDANT WBC PRESENT,BOTH PMN AND MONONUCLEAR FEW SQUAMOUS EPITHELIAL CELLS PRESENT ABUNDANT GRAM NEGATIVE RODS ABUNDANT GRAM POSITIVE COCCI FEW YEAST    Culture   Final    FEW Consistent with normal respiratory flora. No Pseudomonas species isolated Performed at [facility], [address], [place], [place] [address]    Report Status 06/01/2020 FINAL  Final  Culture, blood (routine x 2)     Status: None   Collection Time: 05/31/20  8:07 AM   Specimen: BLOOD LEFT HAND  Result Value Ref Range Status   Specimen Description BLOOD LEFT HAND  Final   Special Requests   Final    BOTTLES DRAWN AEROBIC AND ANAEROBIC Blood Culture adequate  volume   Culture   Final    NO GROWTH 5 DAYS Performed at [facility], [address], [place], [place] [address]    Report Status 06/05/2020 FINAL  Final  Culture, blood (routine x 2)     Status: None   Collection Time: 05/31/20  8:12 AM   Specimen: BLOOD RIGHT HAND  Result Value Ref Range Status   Specimen Description BLOOD RIGHT HAND  Final   Special Requests   Final    BOTTLES DRAWN AEROBIC ONLY Blood Culture results may not be optimal due to an inadequate volume of blood received in culture bottles   Culture   Final    NO GROWTH 5 DAYS Performed at [facility], [address] [place], [place] [address]    Report Status 06/05/2020 FINAL  Final  Gram stain     Status: None   Collection Time: 05/31/20 11:52 AM   Specimen: Fluid  Result Value Ref Range Status   Specimen Description FLUID  Final   Special Requests PLEURAL FLUID LEFT  Final   Gram Stain   Final    FEW WBC PRESENT, PREDOMINANTLY PMN NO ORGANISMS SEEN Performed at [facility], [address] [place], [place] [address]    Report Status 05/31/2020 FINAL  Final  Culture, body fluid w Gram Stain-bottle     Status: None   Collection Time: 05/31/20 11:52 AM   Specimen: Fluid  Result Value Ref Range Status   Specimen Description FLUID  Final   Special Requests PLEURAL FLUID LEFT  Final   Culture   Final    NO GROWTH 5 DAYS Performed at [facility], [address] [place], [place] [address]    Report Status 06/05/2020 FINAL  Final  Culture, blood (routine x 2)     Status: None (Preliminary result)   Collection Time: 06/04/20  9:54 PM   Specimen: BLOOD LEFT HAND  Result Value Ref Range Status   Specimen Description BLOOD LEFT HAND  Final   Special Requests   Final    BOTTLES DRAWN AEROBIC AND ANAEROBIC Blood Culture adequate volume   Culture   Final    NO GROWTH 2 DAYS Performed at [facility], [address] [place], [place] [address]    Report Status PENDING  Incomplete   Culture, blood (routine x 2)     Status: None (Preliminary result)   Collection Time: 06/04/20 10:07 PM   Specimen: BLOOD LEFT HAND  Result Value Ref Range Status   Specimen Description BLOOD LEFT HAND  Final   Special Requests   Final    BOTTLES DRAWN AEROBIC AND ANAEROBIC Blood Culture adequate volume   Culture   Final    NO GROWTH 2 DAYS Performed at [facility], [address] [place], [place] [address]    Report Status PENDING  Incomplete     Radiology Studies: DG CHEST PORT 1 VIEW  Result Date: 06/05/2020 CLINICAL DATA:  Pleural effusion, dyspnea EXAM: PORTABLE CHEST 1 VIEW COMPARISON:  06/03/2020 FINDINGS: Bilateral asymmetric pulmonary infiltrate, more focal within the right mid lung zone peripherally and within the left lung base appears slightly progressive within the right lung. Numerous cystic lucencies are again identified in keeping with the multiple cavitary lesions noted on prior CT examination of 05/31/2020. No pneumothorax. Tiny bilateral pleural effusions are present. Cardiac size within normal limits. Right upper extremity PICC line has been placed with its tip within the superior right atrium. Pulmonary vascularity is normal. No acute bone abnormality. IMPRESSION: Right upper extremity PICC line tip within the superior right atrium. Progressive multifocal pulmonary infiltrates, Electronically Signed   By: Helyn NumbersAshesh  Parikh MD   On: 06/05/2020 07:26   DG CHEST PORT 1 VIEW  Final Result    US EKG SITE RITE  Final Result    DG CHEST PORT 1 VIEW  Final Result  DG Chest Port 1 View  Final Result    DG CHEST PORT 1 VIEW  Final Result    CT CHEST WO CONTRAST  Final Result    DG Chest 1 View  Final Result    US THORACENTESIS ASP PLEURAL SPACE W/IMG GUIDE  Final Result    DG CHEST PORT 1 VIEW  Final Result    DG CHEST PORT 1 VIEW  Final Result    US THORACENTESIS ASP PLEURAL SPACE W/IMG GUIDE  Final Result    CT CHEST WO CONTRAST  Final Result     DG CHEST PORT 1 VIEW  Final Result    CT HEAD WO CONTRAST  Final Result    US RENAL  Final Result    DG CHEST PORT 1 VIEW  Final Result    VAS [place] UPPER EXTREMITY VENOUS DUPLEX  Final Result    CT Chest Wo Contrast  Final Result    DG Chest Port 1 View  Final Result      Scheduled Meds: . Chlorhexidine Gluconate Cloth  6 each Topical Daily  . feeding supplement  237 mL Oral BID BM  . heparin  5,000 Units Subcutaneous Q8H  . multivitamin with minerals  1 tablet Oral Daily  . sodium chloride flush  10 mL Intracatheter Q8H  . sodium chloride flush  10-40 mL Intracatheter Q12H   PRN Meds: acetaminophen, albuterol, docusate sodium, oxyCODONE, polyethylene glycol, sodium chloride flush Continuous Infusions: .  ceFAZolin (ANCEF) IV 2 g (06/06/20 1353)     LOS: 13 days  Time spent: Greater than 50% of the 35 minute visit was spent in counseling/coordination of care for the patient as laid out in the A&P.   Lewie Chamber, MD [facility] 06/06/2020, 2:32 PM

## 2020-06-06 NOTE — Progress Notes (Addendum)
NAME:  Edward Mueller, MRN:  [id number], DOB:  Jan 01, 1971, LOS: 13 ADMISSION DATE:  05/24/2020, CONSULTATION DATE:  05/24/2020 REFERRING MD:  Lockie Mola, CHIEF COMPLAINT:  Dyspnea   History of Present Illness:  50 year old man who presented to [facility] on 4/3 with c/o dyspnea.    Reportedly was in his usual state of health until 48 hours prior to admit. 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  On 4/1 he donated plasma.  After that he woke with a "knot on his arm" and felt poorly.  He had malaise, N/V and SOB with associated central chest pain.  He began coughing up bloody sputum.  He was found to have sepsis in the setting of left arm cellulitis, extensive PNA, MSSA bacteremia & AKI.  TEE negative for endocarditis.   Now s/p R chest tube and L thoracentesis performed by IR.   Pertinent  Medical History  Tobacco / THC use  Significant Hospital Events: Including procedures, antibiotic start and stop dates in addition to other pertinent events    4/3 Admit with dyspnea, febrile with elevated lactate, imaging consistent with PNA, L arm cellulitis. Negative HIV, COVID, Flu. Cultures positive for MSSA.   4/4 To [facility]   4/6 TEE negative for vegetation   4/7 PCCM called back for increased O2 needs. O2 weaned to 3L on exam, sats 97%. VBG 7.4 / 42s  4/9 chest CT revealed bilateral pleural effusions with appearance of loculation, IR consulted and placed right pigtail chest tube patient received first dose of TPA/dornase  4/10  2L removed via chest tube since insertion.  Second dose TPA dornase administered  4/10 L thoracentesis performed by IR yielding 550 mL dark red fluid, GS neg, pleural cx pending  4/11 another chest tube drainage, WBC trending 44->31.5  4/12 right chest tube removed due to low output, PCT 7.37-> 2.85   Interim History / Subjective:  Reports being better.  T-max of 100.4.  No acute distress.  Pulmonary critical  care will evaluate again on Monday   Objective   Blood pressure 126/76, pulse 96, temperature 98.8 F (37.1 C), temperature source Oral, resp. rate 18, height 6\' 3"  (1.905 m), weight 64.3 kg, SpO2 98 %.        Intake/Output Summary (Last 24 hours) at 06/06/2020 0847 Last data filed at 06/05/2020 2000 Gross per 24 hour  Intake 360 ml  Output 250 ml  Net 110 ml   Filed Weights   06/01/20 0405 06/04/20 0620 06/06/20 0322  Weight: 70.2 kg 66.1 kg 64.3 kg    Examination: General: Thin male no acute distress reports feeling better today HEENT: No JVD or lymphadenopathy is appreciated Neuro: Grossly intact without focal defect CV: Sounds are regular PULM: Diminished in the bases.  Room air with O2 sats of 98%.  Good chest excursion reports breathing better. GI: soft, bsx4 active   Extremities: warm/dry, negative edema  Skin: no rashes or lesions    Labs/imaging that I havepersonally reviewed  (right click and "Reselect all SmartList Selections" daily)  CXR personally reviewed 4/14 > bilateral asymmetric pulmonary infiltrate, worse on the right.  Effusions seem tiny  Labs show improved sodium to 134, decreasing leukocytosis, worsening anemia, thrombocytosis, stable improved renal function  Left pleural fluid 4/10: GS neg, cx-> ngtd x 2 [facility] 4/14 >>   Resolved Hospital Problem list   Septic shock Acute respiratory failure with hypoxia  Assessment & Plan:  Cavitary pneumonia from MSSA tricuspid endocarditis  Loculated R empyema, simple left parapneumonic effusion   Radiographic evaluation in near future   MSSA bacteremia Per primary.  Currently on IV cefazolin per infectious disease services. White count is trending down T-max is 100 He may need cardiothoracic surgical intervention in future Pulmonary will see again on Monday    Best practice (right click and "Reselect all SmartList Selections" daily)  Per primary  Labs   CBC: Recent Labs  Lab 05/31/20 0124  06/01/20 0239 06/02/20 0422 06/04/20 0309 06/05/20 0500 06/06/20 0500  WBC 45.1* 44.2* 31.5* 23.2* 21.4* 18.6*  NEUTROABS 37.6* 38.3* 27.7*  --  19.1* 16.5*  HGB 12.6* 11.6* 10.1* 9.2* 8.8* 8.5*  HCT 36.0* 33.9* 28.6* 26.8* 25.6* 25.2*  MCV 94.2 93.1 93.8 94.4 94.5 95.1  PLT 334 422* 514* 693* 786* 825*    Basic Metabolic Panel: Recent Labs  Lab 06/02/20 0422 06/03/20 0145 06/04/20 0309 06/05/20 0500 06/06/20 0500  NA 129* 130* 134* 134* 134*  K 3.7 3.6 3.9 4.3 4.4  CL 94* 95* 99 100 103  CO2 27 28 26 27 27   GLUCOSE 104* 98 103* 105* 99  BUN 29* 20 16 12 11   CREATININE 1.16 1.08 1.00 1.01 0.99  CALCIUM 7.1* 7.2* 7.3* 7.6* 7.5*  MG 2.1 2.1 1.9 1.9 1.7  PHOS 2.9 3.2  --   --   --    GFR: Estimated Creatinine Clearance: 80.3 mL/min (by C-G formula based on SCr of 0.99 mg/dL). Recent Labs  Lab 05/31/20 0124 06/01/20 0239 06/02/20 0422 06/04/20 0309 06/05/20 0500 06/06/20 0500  PROCALCITON 7.37  --  2.85  --  0.84  --   WBC 45.1*   < > 31.5* 23.2* 21.4* 18.6*   < > = values in this interval not displayed.   06/07/20 Maylyn Narvaiz ACNP Acute Care Nurse Practitioner 06/08/20 Pulmonary/Critical Care Please consult Amion 06/06/2020, 8:47 AM

## 2020-06-07 LAB — CBC WITH DIFFERENTIAL/PLATELET
Abs Immature Granulocytes: 0.13 10*3/uL — ABNORMAL HIGH (ref 0.00–0.07)
Basophils Absolute: 0.1 10*3/uL (ref 0.0–0.1)
Basophils Relative: 0 %
Eosinophils Absolute: 0.2 10*3/uL (ref 0.0–0.5)
Eosinophils Relative: 1 %
HCT: 25.7 % — ABNORMAL LOW (ref 39.0–52.0)
Hemoglobin: 8.6 g/dL — ABNORMAL LOW (ref 13.0–17.0)
Immature Granulocytes: 1 %
Lymphocytes Relative: 4 %
Lymphs Abs: 0.6 10*3/uL — ABNORMAL LOW (ref 0.7–4.0)
MCH: 32.2 pg (ref 26.0–34.0)
MCHC: 33.5 g/dL (ref 30.0–36.0)
MCV: 96.3 fL (ref 80.0–100.0)
Monocytes Absolute: 1.2 10*3/uL — ABNORMAL HIGH (ref 0.1–1.0)
Monocytes Relative: 8 %
Neutro Abs: 12.7 10*3/uL — ABNORMAL HIGH (ref 1.7–7.7)
Neutrophils Relative %: 86 %
Platelets: 858 10*3/uL — ABNORMAL HIGH (ref 150–400)
RBC: 2.67 MIL/uL — ABNORMAL LOW (ref 4.22–5.81)
RDW: 13.2 % (ref 11.5–15.5)
WBC: 14.9 10*3/uL — ABNORMAL HIGH (ref 4.0–10.5)
nRBC: 0 % (ref 0.0–0.2)

## 2020-06-07 LAB — BASIC METABOLIC PANEL
Anion gap: 8 (ref 5–15)
BUN: 9 mg/dL (ref 6–20)
CO2: 26 mmol/L (ref 22–32)
Calcium: 7.9 mg/dL — ABNORMAL LOW (ref 8.9–10.3)
Chloride: 99 mmol/L (ref 98–111)
Creatinine, Ser: 0.97 mg/dL (ref 0.61–1.24)
GFR, Estimated: >60 mL/min (ref >60)
Glucose, Bld: 98 mg/dL (ref 70–99)
Potassium: 4.4 mmol/L (ref 3.5–5.1)
Sodium: 133 mmol/L — ABNORMAL LOW (ref 135–145)

## 2020-06-07 LAB — MAGNESIUM: Magnesium: 1.9 mg/dL (ref 1.7–2.4)

## 2020-06-07 MED ORDER — ONDANSETRON 4 MG PO TBDP
4.0000 mg | ORAL_TABLET | Freq: Three times a day (TID) | ORAL | Status: DC | PRN
  Administered 2020-06-07: 4 mg via ORAL
  Filled 2020-06-07 (×2): qty 1

## 2020-06-07 MED ORDER — MELATONIN 5 MG PO TABS: 5.0000 mg | ORAL_TABLET | Freq: Every evening | ORAL | Status: DC | PRN

## 2020-06-07 NOTE — Progress Notes (Signed)
Progress Note    Erik ObeyKeith Heal   ZOX:[id number]  DOB: 1970/06/14  DOA: 05/24/2020     14  PCP: Pcp, No  CC: "feeling sick"  Hospital Course: 50 year old gentleman with no reported medical problems who donates plasma 2 times a week, donated plasma on 05/22/20 and after about 24 hours he started not feeling well.  Reported symptoms included generalized malaise, nausea, vomiting, shortness of breath, pleuritic chest pain, cough with blood-tinged sputum. Came to the emergency room on 05/24/20, was febrile, tachypneic with lactic acidosis.  Found to have multilobular pneumonia on chest x-ray, left upper extremity cellulitis and left acute superficial vein thrombosis involving the left cephalic vein on 8/1/19144/05/2020.  Was started on IV antibiotics and IV fluids and admitted to ICU for closer monitoring then later transferred to progressive care unit.  Work-up revealed MSSA bacteremia for which infectious disease was consulted.  No vegetation was seen on TEE on 05/27/2020.  Sputum culture from 05/29/2020 growing abundant gram-negative rods, abundant gram-positive cocci with few yeast, culture was reincubated for better growth.  He has been on cefazolin for MSSA bacteremia as recommended by infectious disease.  Repeated CT chest without contrast done on 05/30/2020 showed interval worsening of the aeration of the lungs.  IR was consulted for thoracentesis and chest tube placement due to loculated pleural effusion.   Post right thoracentesis by IR on 05/30/2020 with approximately 300 cc translucent, serosanguineous fluid removed and right chest tube placement.  Post left thoracentesis by IR on 05/31/2020 with 550 cc dark red fluid removed.  Chest tube is managed by PCCM.  Right-sided chest tube was removed on 06/02/2020 with no reaccumulation of fluid.   Interval History:  No events overnight.  Still doing okay in general; trying to use IS. Still has a cough. Fevers do seem to be downtrending but still having low  grade (100.5).   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 No evidence of valvular vegetation on TEE. Appreciate infectious disease and PCCM assistance. He is currently on IV cefazolin, continue as recommended by infectious disease.   - Plan is for 6 weeks treatment, end date 07/06/2020 - recurrent fevers, BCx were repeated on 06/04/20: continue following - BCx from 4/4 and 4/10 are negative - continue trending WBC and fever curve (Tmax 100.5 since yesterday but overall the curve looks to be downtrending -PICC line placed 06/04/2020 - continue IS - repeat CXR on 4/18 to reassess for development of pleural effusion or other changes   Acute hypoxic respiratory failure 2/2 PNA Septic pulmonary emboli with cavitating lesions  Loculated right parapneumonic effusion, simple left parapneumonic effusion On IV antibiotics as stated above. Post left and right thoracentesis and right chest tube placement by IR. -Right chest tube removed on 06/02/2020.  No significant reaccumulation on CXR obtained on 06/03/2020 and 06/05/20 - s/p right chest tube removed 06/02/20 Continue to maintain O2 saturation greater than 90%. Now on RA - continue IS  Severe protein calorie malnutrition due to severe critical illness Albumin 1.4 on 05/31/2020 BMI 19 Dietitian consulted for assessment of nutrition requirements Continue to encourage increase in oral protein calorie intake.  Resolved post repletion.  Hypocalcemia Calcium corrected for albumin 9.5.  Resolved Non anion gap metabolic acidosis  AKI on CKD 3B, improving. Presented with creatinine of 2.7 Creatinine  downtrending Continue to monitor urine output  Hypovolemic  hyponatremia Encourage increase in oral intake - stable   Repeat BMP in the morning  Hypokalemia Repleted Repeat BMP in the morning  Hypophosphatemia - repleted   Resolved left upper extremity cellulitis Initially presented with left upper extremity cellulitis which has been treated and has now resolved  Left upper extremity acute superficial vein thrombosis Continue to monitor  THC/tobacco use UDS positive for THC on 05/24/2020 Denies use of cocaine or intravenous drug abuse Cessation counseling when hemodynamically stable.  Generalized weakness in the setting of severe, critical illness Out of bed to chair with every shift as tolerated Ambulate patient with every shift as tolerated Increase oral protein calorie intake as tolerated PT assessed with no further recommendations. Continue fall precautions.  Old records reviewed in assessment of this patient  Antimicrobials: Ancef 05/25/20>>current   DVT prophylaxis: heparin injection 5,000 Units Start: 05/24/20 2330 SCDs Start: 05/24/20 2315   Code Status:   Code Status: Full Code Family Communication:   Disposition Plan: Status is: Inpatient  Remains inpatient appropriate because:IV treatments appropriate due to intensity of illness or inability to take PO and Inpatient level of care appropriate due to severity of illness  Dispo:  Patient From: Home  Planned Disposition: Home  Medically stable for discharge: No     Risk of unplanned readmission score: Unplanned Admission- Pilot do not use: 11.76   Objective: Blood pressure 121/84, pulse 94, temperature (!) 100.5 F (38.1 C), temperature source Oral, resp. rate 20, height 6\' 3"  (1.905 m), weight 62.5 kg, SpO2 100 %.  Examination: General appearance: alert, cooperative and no distress Head: Normocephalic, without obvious abnormality, atraumatic Eyes: EOMI Back: minimal tenderness over previous tube site Lungs: improving but still diminished right sided  breath sounds, no wheezing Heart: regular rate and rhythm and S1, S2 normal Abdomen: normal findings: bowel sounds normal and soft, non-tender Extremities: no edema Skin: mobility and turgor normal Neurologic: Grossly normal  Consultants:   ID  Radiology   Pulm  Procedures:   2D echo on 05/25/2020  TEE on 05/27/2020.  Chest tube placement, right, 05/30/20  Right chest tube removal on 06/02/2020.  Data Reviewed: I have personally reviewed following labs and imaging studies Results for orders placed or performed during the hospital encounter of 05/24/20 (from the past 24 hour(s))  Basic metabolic panel     Status: Abnormal   Collection Time: 06/07/20  5:00 AM  Result Value Ref Range   Sodium 133 (L) 135 - 145 mmol/L   Potassium 4.4 3.5 - 5.1 mmol/L   Chloride 99 98 - 111 mmol/L   CO2 26 22 - 32 mmol/L   Glucose, Bld 98 70 - 99 mg/dL   BUN 9 6 - 20 mg/dL   Creatinine, Ser 06/09/20 0.61 - 1.24 mg/dL   Calcium 7.9 (L) 8.9 - 10.3 mg/dL   GFR, Estimated 3.79 >02 mL/min   Anion gap 8 5 - 15  Magnesium     Status: None   Collection Time: 06/07/20  5:00 AM  Result Value Ref Range   Magnesium 1.9 1.7 - 2.4 mg/dL  CBC with Differential/Platelet     Status: Abnormal   Collection Time: 06/07/20  5:00 AM  Result Value Ref Range   WBC 14.9 (H) 4.0 - 10.5 K/uL   RBC 2.67 (L) 4.22 - 5.81 MIL/uL   Hemoglobin 8.6 (L) 13.0 - 17.0 g/dL   HCT 06/09/20 (L) 97.3 - 53.2 %   MCV 96.3 80.0 -  100.0 fL   MCH 32.2 26.0 - 34.0 pg   MCHC 33.5 30.0 - 36.0 g/dL   RDW 56.2 13.0 - 86.5 %   Platelets 858 (H) 150 - 400 K/uL   nRBC 0.0 0.0 - 0.2 %   Neutrophils Relative % 86 %   Neutro Abs 12.7 (H) 1.7 - 7.7 K/uL   Lymphocytes Relative 4 %   Lymphs Abs 0.6 (L) 0.7 - 4.0 K/uL   Monocytes Relative 8 %   Monocytes Absolute 1.2 (H) 0.1 - 1.0 K/uL   Eosinophils Relative 1 %   Eosinophils Absolute 0.2 0.0 - 0.5 K/uL   Basophils Relative 0 %   Basophils Absolute 0.1 0.0 - 0.1 K/uL   Immature Granulocytes 1 %    Abs Immature Granulocytes 0.13 (H) 0.00 - 0.07 K/uL    Recent Results (from the past 240 hour(s))  Expectorated Sputum Assessment w Gram Stain, Rflx to Resp Cult     Status: None   Collection Time: 05/29/20  7:11 AM   Specimen: Expectorated Sputum  Result Value Ref Range Status   Specimen Description EXPECTORATED SPUTUM  Final   Special Requests Normal  Final   Sputum evaluation   Final    THIS SPECIMEN IS ACCEPTABLE FOR SPUTUM CULTURE Performed at [facility], [address], [place], [place] [address]    Report Status 05/29/2020 FINAL  Final  Culture, Respiratory w Gram Stain     Status: None   Collection Time: 05/29/20  7:11 AM  Result Value Ref Range Status   Specimen Description EXPECTORATED SPUTUM  Final   Special Requests Normal Reflexed from [id number]  Final   Gram Stain   Final    ABUNDANT WBC PRESENT,BOTH PMN AND MONONUCLEAR FEW SQUAMOUS EPITHELIAL CELLS PRESENT ABUNDANT GRAM NEGATIVE RODS ABUNDANT GRAM POSITIVE COCCI FEW YEAST    Culture   Final    FEW Consistent with normal respiratory flora. No Pseudomonas species isolated Performed at [facility], [address] [place], [place] [address]    Report Status 06/01/2020 FINAL  Final  Culture, blood (routine x 2)     Status: None   Collection Time: 05/31/20  8:07 AM   Specimen: BLOOD LEFT HAND  Result Value Ref Range Status   Specimen Description BLOOD LEFT HAND  Final   Special Requests   Final    BOTTLES DRAWN AEROBIC AND ANAEROBIC Blood Culture adequate volume   Culture   Final    NO GROWTH 5 DAYS Performed at [facility], [address] [place], [place] [address]    Report Status 06/05/2020 FINAL  Final  Culture, blood (routine x 2)     Status: None   Collection Time: 05/31/20  8:12 AM   Specimen: BLOOD RIGHT HAND  Result Value Ref Range Status   Specimen Description BLOOD RIGHT HAND  Final   Special Requests   Final    BOTTLES DRAWN AEROBIC ONLY Blood Culture results may not be  optimal due to an inadequate volume of blood received in culture bottles   Culture   Final    NO GROWTH 5 DAYS Performed at [facility], [address] [place], [place] [address]    Report Status 06/05/2020 FINAL  Final  Gram stain     Status: None   Collection Time: 05/31/20 11:52 AM   Specimen: Fluid  Result Value Ref Range Status   Specimen Description FLUID  Final   Special Requests PLEURAL FLUID  LEFT  Final   Gram Stain   Final    FEW WBC PRESENT, PREDOMINANTLY PMN NO ORGANISMS SEEN Performed at [facility], [address] [place], [place] [address]    Report Status 05/31/2020 FINAL  Final  Culture, body fluid w Gram Stain-bottle     Status: None   Collection Time: 05/31/20 11:52 AM   Specimen: Fluid  Result Value Ref Range Status   Specimen Description FLUID  Final   Special Requests PLEURAL FLUID LEFT  Final   Culture   Final    NO GROWTH 5 DAYS Performed at [facility], [address] [place], [place] [address]    Report Status 06/05/2020 FINAL  Final  Culture, blood (routine x 2)     Status: None (Preliminary result)   Collection Time: 06/04/20  9:54 PM   Specimen: BLOOD LEFT HAND  Result Value Ref Range Status   Specimen Description BLOOD LEFT HAND  Final   Special Requests   Final    BOTTLES DRAWN AEROBIC AND ANAEROBIC Blood Culture adequate volume   Culture   Final    NO GROWTH 2 DAYS Performed at [facility], [address], [place], [place] [address]    Report Status PENDING  Incomplete  Culture, blood (routine x 2)     Status: None (Preliminary result)   Collection Time: 06/04/20 10:07 PM   Specimen: BLOOD LEFT HAND  Result Value Ref Range Status   Specimen Description BLOOD LEFT HAND  Final   Special Requests   Final    BOTTLES DRAWN AEROBIC AND ANAEROBIC Blood Culture adequate volume   Culture   Final    NO GROWTH 2 DAYS Performed at [facility], [address], [place], [place] [address]    Report Status  PENDING  Incomplete     Radiology Studies: No results found. DG CHEST PORT 1 VIEW  Final Result    [place] EKG SITE RITE  Final Result    DG CHEST PORT 1 VIEW  Final Result    DG Chest Port 1 View  Final Result    DG CHEST PORT 1 VIEW  Final Result    CT CHEST WO CONTRAST  Final Result    DG Chest 1 View  Final Result    US THORACENTESIS ASP PLEURAL SPACE W/IMG GUIDE  Final Result    DG CHEST PORT 1 VIEW  Final Result    DG CHEST PORT 1 VIEW  Final Result    US THORACENTESIS ASP PLEURAL SPACE W/IMG GUIDE  Final Result    CT CHEST WO CONTRAST  Final Result    DG CHEST PORT 1 VIEW  Final Result    CT HEAD WO CONTRAST  Final Result    US RENAL  Final Result    DG CHEST PORT 1 VIEW  Final Result    VAS [place] UPPER EXTREMITY VENOUS DUPLEX  Final Result    CT Chest Wo Contrast  Final Result    DG Chest Port 1 View  Final Result      Scheduled Meds: . Chlorhexidine Gluconate Cloth  6 each Topical Daily  . feeding supplement  237 mL Oral BID BM  . heparin  5,000 Units Subcutaneous Q8H  . multivitamin with minerals  1 tablet Oral Daily  . sodium chloride flush  10 mL Intracatheter Q8H  . sodium chloride flush  10-40 mL Intracatheter Q12H   PRN Meds: acetaminophen, albuterol, docusate sodium, oxyCODONE,  polyethylene glycol, sodium chloride flush Continuous Infusions: .  ceFAZolin (ANCEF) IV 2 g (06/07/20 0601)     LOS: 14 days  Time spent: Greater than 50% of the 35 minute visit was spent in counseling/coordination of care for the patient as laid out in the A&P.   Lewie Chamber, MD [facility] 06/07/2020, 12:36 PM

## 2020-06-08 ENCOUNTER — Inpatient Hospital Stay (HOSPITAL_COMMUNITY): Payer: Medicaid Other

## 2020-06-08 LAB — CBC WITH DIFFERENTIAL/PLATELET
Abs Immature Granulocytes: 0.15 10*3/uL — ABNORMAL HIGH (ref 0.00–0.07)
Basophils Absolute: 0.1 10*3/uL (ref 0.0–0.1)
Basophils Relative: 0 %
Eosinophils Absolute: 0.2 10*3/uL (ref 0.0–0.5)
Eosinophils Relative: 2 %
HCT: 26.9 % — ABNORMAL LOW (ref 39.0–52.0)
Hemoglobin: 8.9 g/dL — ABNORMAL LOW (ref 13.0–17.0)
Immature Granulocytes: 1 %
Lymphocytes Relative: 5 %
Lymphs Abs: 0.7 10*3/uL (ref 0.7–4.0)
MCH: 32.1 pg (ref 26.0–34.0)
MCHC: 33.1 g/dL (ref 30.0–36.0)
MCV: 97.1 fL (ref 80.0–100.0)
Monocytes Absolute: 1.2 10*3/uL — ABNORMAL HIGH (ref 0.1–1.0)
Monocytes Relative: 9 %
Neutro Abs: 12 10*3/uL — ABNORMAL HIGH (ref 1.7–7.7)
Neutrophils Relative %: 83 %
Platelets: 890 10*3/uL — ABNORMAL HIGH (ref 150–400)
RBC: 2.77 MIL/uL — ABNORMAL LOW (ref 4.22–5.81)
RDW: 13 % (ref 11.5–15.5)
WBC: 14.5 10*3/uL — ABNORMAL HIGH (ref 4.0–10.5)
nRBC: 0 % (ref 0.0–0.2)

## 2020-06-08 LAB — BASIC METABOLIC PANEL
Anion gap: 6 (ref 5–15)
BUN: 10 mg/dL (ref 6–20)
CO2: 27 mmol/L (ref 22–32)
Calcium: 8 mg/dL — ABNORMAL LOW (ref 8.9–10.3)
Chloride: 99 mmol/L (ref 98–111)
Creatinine, Ser: 1.01 mg/dL (ref 0.61–1.24)
GFR, Estimated: >60 mL/min (ref >60)
Glucose, Bld: 98 mg/dL (ref 70–99)
Potassium: 4.4 mmol/L (ref 3.5–5.1)
Sodium: 132 mmol/L — ABNORMAL LOW (ref 135–145)

## 2020-06-08 LAB — MAGNESIUM: Magnesium: 1.9 mg/dL (ref 1.7–2.4)

## 2020-06-08 NOTE — Progress Notes (Signed)
Progress Note    Edward Mueller   NOB:[id number]  DOB: 1971-02-12  DOA: 05/24/2020     15  PCP: Pcp, No  CC: "feeling sick"  Hospital Course: 50 year old gentleman with no reported medical problems who donates plasma 2 times a week, donated plasma on 05/22/20 and after about 24 hours he started not feeling well.  Reported symptoms included generalized malaise, nausea, vomiting, shortness of breath, pleuritic chest pain, cough with blood-tinged sputum. Came to the emergency room on 05/24/20, was febrile, tachypneic with lactic acidosis.  Found to have multilobular pneumonia on chest x-ray, left upper extremity cellulitis and left acute superficial vein thrombosis involving the left cephalic vein on 10/25/7652.  Was started on IV antibiotics and IV fluids and admitted to ICU for closer monitoring then later transferred to progressive care unit.  Work-up revealed MSSA bacteremia for which infectious disease was consulted.  No vegetation was seen on TEE on 05/27/2020.  Sputum culture from 05/29/2020 growing abundant gram-negative rods, abundant gram-positive cocci with few yeast, culture was reincubated for better growth.  He has been on cefazolin for MSSA bacteremia as recommended by infectious disease.  Repeated CT chest without contrast done on 05/30/2020 showed interval worsening of the aeration of the lungs.  IR was consulted for thoracentesis and chest tube placement due to loculated pleural effusion.   Post right thoracentesis by IR on 05/30/2020 with approximately 300 cc translucent, serosanguineous fluid removed and right chest tube placement.  Post left thoracentesis by IR on 05/31/2020 with 550 cc dark red fluid removed.  Chest tube is managed by PCCM.  Right-sided chest tube was removed on 06/02/2020 with no reaccumulation of fluid.   Interval History:  No events overnight. His biggest concern today is now that he no longer will have the help of his sister at home on discharge for help with his  abx.  He is nervous about going home before his abx complete.   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 No evidence of valvular vegetation on TEE. Appreciate infectious disease and PCCM assistance. He is currently on IV cefazolin, continue as recommended by infectious disease.   - Plan is for 6 weeks treatment, end date 07/06/2020 - recurrent fevers, BCx were repeated on 06/04/20: continue following - BCx from 4/4 and 4/10 are negative - continue trending WBC and fever curve stable and expected given cavitary lesions (will take weeks to resolve) -PICC line placed 06/04/2020 - continue IS - repeat CXR on 4/18 is stable; no reaccumulation.   Acute hypoxic respiratory failure 2/2 PNA Septic pulmonary emboli with cavitating lesions  Loculated right parapneumonic effusion, simple left parapneumonic effusion On IV antibiotics as stated above. Post left and right thoracentesis and right chest tube placement by IR. -Right chest tube removed on 06/02/2020.  No significant reaccumulation on CXR obtained on 06/03/2020 and 06/05/20 - s/p right chest tube removed 06/02/20 Continue to maintain O2 saturation greater than 90%. Now on RA - continue IS  Severe protein calorie malnutrition due to severe critical illness Albumin 1.4 on 05/31/2020 BMI 19 Dietitian consulted for assessment of nutrition requirements Continue to encourage increase in oral protein calorie intake.  Resolved post repletion.  Hypocalcemia  Resolved Non anion gap metabolic acidosis  AKI on CKD 3B, improving. Presented with creatinine of 2.7 Creatinine downtrending  Continue to monitor urine output  Hypovolemic hyponatremia Encourage increase in oral  intake - stable   Repeat BMP as needed  Hypokalemia Repleted Repeat BMP as needed  Hypophosphatemia - repleted   Resolved left upper extremity cellulitis Initially presented with left upper extremity cellulitis which has been treated and has now resolved  Left upper extremity acute superficial vein thrombosis Continue to monitor  THC/tobacco use UDS positive for THC on 05/24/2020 Denies use of cocaine or intravenous drug abuse Cessation counseling when hemodynamically stable.  Generalized weakness in the setting of severe, critical illness Out of bed to chair with every shift as tolerated Ambulate patient with every shift as tolerated Increase oral protein calorie intake as tolerated PT assessed with no further recommendations. Continue fall precautions.  Old records reviewed in assessment of this patient  Antimicrobials: Ancef 05/25/20>>current   DVT prophylaxis: heparin injection 5,000 Units Start: 05/24/20 2330 SCDs Start: 05/24/20 2315   Code Status:   Code Status: Full Code Family Communication:   Disposition Plan: Status is: Inpatient  Remains inpatient appropriate because:IV treatments appropriate due to intensity of illness or inability to take PO and Inpatient level of care appropriate due to severity of illness  Dispo:  Patient From: Home  Planned Disposition: To be determined  Medically stable for discharge: No     Risk of unplanned readmission score: Unplanned Admission- Pilot do not use: 12.08   Objective: Blood pressure 107/64, pulse (!) 103, temperature 99.5 F (37.5 C), temperature source Oral, resp. rate 20, height 6\' 3"  (1.905 m), weight 61.3 kg, SpO2 98 %.  Examination: General appearance: alert, cooperative and no distress Head: Normocephalic, without obvious abnormality, atraumatic Eyes: EOMI Back: minimal tenderness over previous tube site Lungs: improving but still diminished right sided breath sounds, no wheezing Heart: regular  rate and rhythm and S1, S2 normal Abdomen: normal findings: bowel sounds normal and soft, non-tender Extremities: no edema Skin: mobility and turgor normal Neurologic: Grossly normal  Consultants:   ID  Radiology  - consult PRN  Pulm - signed off  Procedures:   2D echo on 05/25/2020  TEE on 05/27/2020.  Chest tube placement, right, 05/30/20  Right chest tube removal on 06/02/2020.  Data Reviewed: I have personally reviewed following labs and imaging studies Results for orders placed or performed during the hospital encounter of 05/24/20 (from the past 24 hour(s))  Basic metabolic panel     Status: Abnormal   Collection Time: 06/08/20  5:00 AM  Result Value Ref Range   Sodium 132 (L) 135 - 145 mmol/L   Potassium 4.4 3.5 - 5.1 mmol/L   Chloride 99 98 - 111 mmol/L   CO2 27 22 - 32 mmol/L   Glucose, Bld 98 70 - 99 mg/dL   BUN 10 6 - 20 mg/dL   Creatinine, Ser 5.781.01 0.61 - 1.24 mg/dL   Calcium 8.0 (L) 8.9 - 10.3 mg/dL   GFR, Estimated >46>60 >96>60 mL/min   Anion gap 6 5 - 15  Magnesium     Status: None   Collection Time: 06/08/20  5:00 AM  Result Value Ref Range   Magnesium 1.9 1.7 - 2.4 mg/dL  CBC with Differential/Platelet     Status: Abnormal   Collection Time: 06/08/20  5:00 AM  Result Value Ref Range   WBC 14.5 (H) 4.0 - 10.5 K/uL   RBC 2.77 (L) 4.22 - 5.81 MIL/uL   Hemoglobin 8.9 (L) 13.0 - 17.0 g/dL   HCT 29.526.9 (L) 28.439.0 - 13.252.0 %  MCV 97.1 80.0 - 100.0 fL   MCH 32.1 26.0 - 34.0 pg   MCHC 33.1 30.0 - 36.0 g/dL   RDW 58.0 99.8 - 33.8 %   Platelets 890 (H) 150 - 400 K/uL   nRBC 0.0 0.0 - 0.2 %   Neutrophils Relative % 83 %   Neutro Abs 12.0 (H) 1.7 - 7.7 K/uL   Lymphocytes Relative 5 %   Lymphs Abs 0.7 0.7 - 4.0 K/uL   Monocytes Relative 9 %   Monocytes Absolute 1.2 (H) 0.1 - 1.0 K/uL   Eosinophils Relative 2 %   Eosinophils Absolute 0.2 0.0 - 0.5 K/uL   Basophils Relative 0 %   Basophils Absolute 0.1 0.0 - 0.1 K/uL   Immature Granulocytes 1 %   Abs Immature  Granulocytes 0.15 (H) 0.00 - 0.07 K/uL    Recent Results (from the past 240 hour(s))  Culture, blood (routine x 2)     Status: None   Collection Time: 05/31/20  8:07 AM   Specimen: BLOOD LEFT HAND  Result Value Ref Range Status   Specimen Description BLOOD LEFT HAND  Final   Special Requests   Final    BOTTLES DRAWN AEROBIC AND ANAEROBIC Blood Culture adequate volume   Culture   Final    NO GROWTH 5 DAYS Performed at [facility], [address] [place], [place] [address]    Report Status 06/05/2020 FINAL  Final  Culture, blood (routine x 2)     Status: None   Collection Time: 05/31/20  8:12 AM   Specimen: BLOOD RIGHT HAND  Result Value Ref Range Status   Specimen Description BLOOD RIGHT HAND  Final   Special Requests   Final    BOTTLES DRAWN AEROBIC ONLY Blood Culture results may not be optimal due to an inadequate volume of blood received in culture bottles   Culture   Final    NO GROWTH 5 DAYS Performed at [facility], [address], [place], [place] [address]    Report Status 06/05/2020 FINAL  Final  Gram stain     Status: None   Collection Time: 05/31/20 11:52 AM   Specimen: Fluid  Result Value Ref Range Status   Specimen Description FLUID  Final   Special Requests PLEURAL FLUID LEFT  Final   Gram Stain   Final    FEW WBC PRESENT, PREDOMINANTLY PMN NO ORGANISMS SEEN Performed at [facility], [address], [place], [place] [address]    Report Status 05/31/2020 FINAL  Final  Culture, body fluid w Gram Stain-bottle     Status: None   Collection Time: 05/31/20 11:52 AM   Specimen: Fluid  Result Value Ref Range Status   Specimen Description FLUID  Final   Special Requests PLEURAL FLUID LEFT  Final   Culture   Final    NO GROWTH 5 DAYS Performed at [facility], [address], [place], [place] [address]    Report Status 06/05/2020 FINAL  Final  Culture, blood (routine x 2)     Status: None (Preliminary result)   Collection Time:  06/04/20  9:54 PM   Specimen: BLOOD LEFT HAND  Result Value Ref Range Status   Specimen Description BLOOD LEFT HAND  Final   Special Requests   Final    BOTTLES DRAWN AEROBIC AND ANAEROBIC Blood Culture adequate volume   Culture   Final    NO GROWTH 4 DAYS Performed at [facility]  [facility], [address] [place], [place] [address]    Report Status PENDING  Incomplete  Culture, blood (routine x 2)     Status: None (Preliminary result)   Collection Time: 06/04/20 10:07 PM   Specimen: BLOOD LEFT HAND  Result Value Ref Range Status   Specimen Description BLOOD LEFT HAND  Final   Special Requests   Final    BOTTLES DRAWN AEROBIC AND ANAEROBIC Blood Culture adequate volume   Culture   Final    NO GROWTH 4 DAYS Performed at [facility], [address] [place], [place] [address]    Report Status PENDING  Incomplete     Radiology Studies: DG CHEST PORT 1 VIEW  Result Date: 06/08/2020 CLINICAL DATA:  Shortness of breath, chest pain. EXAM: PORTABLE CHEST 1 VIEW COMPARISON:  June 05, 2020. FINDINGS: Mild improvement in left basilar opacities. Otherwise, similar patchy multifocal opacities. Similar numerous cystic lucencies, compatible with cavitary lesion seen on prior CT chest. No visible pneumothorax on this semi erect radiograph. Suspected small bilateral pleural effusions, unchanged. Similar cardiac silhouette, within normal limits. Similar positioning of a right upper extremity PICC. IMPRESSION: 1. Slight improvement in left basilar opacities. Otherwise, similar multifocal opacities and cystic lucencies. 2. Similar suspected small bilateral pleural effusions Electronically Signed   By: Feliberto Harts MD   On: 06/08/2020 09:10   DG CHEST PORT 1 VIEW  Final Result    DG CHEST PORT 1 VIEW  Final Result    [place] EKG SITE RITE  Final Result    DG CHEST PORT 1 VIEW  Final Result    DG Chest Port 1 View  Final Result    DG CHEST PORT 1 VIEW  Final Result    CT CHEST WO  CONTRAST  Final Result    DG Chest 1 View  Final Result    US THORACENTESIS ASP PLEURAL SPACE W/IMG GUIDE  Final Result    DG CHEST PORT 1 VIEW  Final Result    DG CHEST PORT 1 VIEW  Final Result    US THORACENTESIS ASP PLEURAL SPACE W/IMG GUIDE  Final Result    CT CHEST WO CONTRAST  Final Result    DG CHEST PORT 1 VIEW  Final Result    CT HEAD WO CONTRAST  Final Result    US RENAL  Final Result    DG CHEST PORT 1 VIEW  Final Result    VAS [place] UPPER EXTREMITY VENOUS DUPLEX  Final Result    CT Chest Wo Contrast  Final Result    DG Chest Port 1 View  Final Result      Scheduled Meds: . Chlorhexidine Gluconate Cloth  6 each Topical Daily  . feeding supplement  237 mL Oral BID BM  . heparin  5,000 Units Subcutaneous Q8H  . multivitamin with minerals  1 tablet Oral Daily  . sodium chloride flush  10 mL Intracatheter Q8H  . sodium chloride flush  10-40 mL Intracatheter Q12H   PRN Meds: acetaminophen, albuterol, docusate sodium, ondansetron, oxyCODONE, polyethylene glycol, sodium chloride flush Continuous Infusions: .  ceFAZolin (ANCEF) IV 2 g (06/08/20 0523)     LOS: 15 days  Time spent: Greater than 50% of the 35 minute visit was spent in counseling/coordination of care for the patient as laid out in the A&P.   Lewie Chamber, MD [facility] 06/08/2020, 1:51 PM

## 2020-06-08 NOTE — Progress Notes (Signed)
Pt voiced to this RN that he would like to try to stay in the hospital to finish out his antibiotics. He is concerned since his home situation has changed- he no longer will have the help as expected. He is also concerned that with this current situation that he would "end up back in the hospital and wants to be better before leaving." I will hand this information off to oncoming RN to relay to CM.

## 2020-06-08 NOTE — Progress Notes (Signed)
NAME:  Edward Mueller, MRN:  [id number], DOB:  21-Sep-1970, LOS: 15 ADMISSION DATE:  05/24/2020, CONSULTATION DATE:  05/24/2020 REFERRING MD:  Lockie Mola, CHIEF COMPLAINT:  Dyspnea   Brief Hx:  50 year old man who presented to [facility] on 4/3 with c/o dyspnea.    Reportedly was in his usual state of health until 48 hours prior to admit. 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  On 4/1 he donated plasma.  After that he woke with a "knot on his arm" and felt poorly.  He had malaise, N/V and SOB with associated central chest pain.  He began coughing up bloody sputum.  He was found to have sepsis in the setting of left arm cellulitis, extensive PNA, MSSA bacteremia & AKI.  TEE negative for endocarditis.   Now s/p R chest tube and L thoracentesis performed by IR.   Pertinent  Medical History  Tobacco / THC use  Significant Hospital Events: Including procedures, antibiotic start and stop dates in addition to other pertinent events    4/3 Admit with dyspnea, febrile with elevated lactate, imaging consistent with PNA, L arm cellulitis. Negative HIV, COVID, Flu. Cultures positive for MSSA.   4/4 To [facility]   4/6 TEE negative for vegetation   4/7 PCCM called back for increased O2 needs. O2 weaned to 3L on exam, sats 97%. VBG 7.4 / 42s  4/9 chest CT revealed bilateral pleural effusions with appearance of loculation, IR consulted and placed right pigtail chest tube patient received first dose of TPA/dornase  4/10  2L removed via chest tube since insertion.  Second dose TPA dornase administered  4/10 L thoracentesis performed by IR yielding 550 mL dark red fluid, GS neg, pleural cx pending  4/11 another chest tube drainage, WBC trending 44->31.5  4/12 right chest tube removed due to low output, PCT 7.37-> 2.85  4/18 On room air, no distress. Reports change in home support > no one to help with PICC    Interim History / Subjective:  On RA   Tmax in last 24 hours 101.3  Pt reports his sister was going to help with his PICC line at home but now she can no longer help as she has a new job.  His mother is elderly and would not be able to help. He is asking if he can complete therapy here.  Concerned he would have to come back.    Objective   Blood pressure 115/76, pulse 94, temperature 100.3 F (37.9 C), temperature source Oral, resp. rate 20, height 6\' 3"  (1.905 m), weight 61.3 kg, SpO2 98 %.        Intake/Output Summary (Last 24 hours) at 06/08/2020 0753 Last data filed at 06/08/2020 0345 Gross per 24 hour  Intake 700 ml  Output 1345 ml  Net -645 ml   Filed Weights   06/06/20 0322 06/07/20 0508 06/08/20 0523  Weight: 64.3 kg 62.5 kg 61.3 kg    Examination: General: thin adult male lying in bed in NAD  HEENT: MM pink/moist, anicteric  Neuro: AAOx4, speech clear, MAE  CV: s1s2 RRR, SR 90's, no m/r/g PULM: non-labored on RA, lungs bilaterally clear GI: soft, bsx4 active  Extremities: warm/dry, no edema  Skin: no rashes or lesions  Resolved Hospital Problem list   Septic shock Acute respiratory failure with hypoxia  Superficial Vein Thrombosis   Assessment & Plan:   Cavitary Pneumonia from MSSA Tricuspid  Endocarditis  Loculated R Empyema, Simple Left Parapneumonic Effusion  -CXR images personally reviewed, improved airspace disease, no evidence of effusion  -pulmonary hygiene - IS, flutter, mobilize  -consider repeat CT evaluation in near future to ensure resolution   MSSA Bacteremia Negative for vegetation on TEE, presumed TV Endocarditis  -per primary  -continue Cefazolin per ID -may need TCTS evaluation in the future  -will need to discuss with ID regarding discharge plan > can he transition to oral agent rather then PICC given change in home circumstances. Current plan is for 6 weeks therapy to end on 5/16 -follow repeat blood cultures to maturity   PCCM will follow up 4/21   Best practice (right  click and "Reselect all SmartList Selections" daily)  Per Primary   Canary Brim, MSN, APRN, NP-C, AGACNP-BC [facility] 06/08/2020, 7:53 AM   Please see [email or web address] for pager details.   From 7A-7P if no response, please call [phone] After hours, please call ELink [phone]

## 2020-06-08 NOTE — Progress Notes (Signed)
Subjective:  Edward Mueller was voicing concern about his ability to to receive IV antibiotics at home.  Antibiotics:  Anti-infectives (From admission, onward)   Start     Dose/Rate Route Frequency Ordered Stop   06/02/20 0000  ceFAZolin (ANCEF) IVPB        2 g Intravenous Every 8 hours 06/02/20 0544 07/07/20 2359   05/30/20 0715  meropenem (MERREM) 1 g in sodium chloride 0.9 % 100 mL IVPB  Status:  Discontinued        1 g 200 mL/hr over 30 Minutes Intravenous Every 8 hours 05/30/20 0621 05/30/20 1356   05/30/20 0615  linezolid (ZYVOX) IVPB 600 mg  Status:  Discontinued        600 mg 300 mL/hr over 60 Minutes Intravenous Every 12 hours 05/30/20 0609 05/30/20 1357   05/25/20 2200  ceFAZolin (ANCEF) IVPB 2g/100 mL premix        2 g 200 mL/hr over 30 Minutes Intravenous Every 8 hours 05/25/20 0945     05/24/20 2200  cefTRIAXone (ROCEPHIN) 2 g in sodium chloride 0.9 % 100 mL IVPB  Status:  Discontinued        2 g 200 mL/hr over 30 Minutes Intravenous Every 12 hours 05/24/20 1934 05/25/20 0936   05/24/20 1715  ceFEPIme (MAXIPIME) 2 g in sodium chloride 0.9 % 100 mL IVPB  Status:  Discontinued        2 g 200 mL/hr over 30 Minutes Intravenous Every 24 hours 05/24/20 1713 05/24/20 1934   05/24/20 1715  vancomycin (VANCOREADY) IVPB 1250 mg/250 mL        1,250 mg 166.7 mL/hr over 90 Minutes Intravenous  Once 05/24/20 1713 05/24/20 2153   05/24/20 1713  vancomycin variable dose per unstable renal function (pharmacist dosing)  Status:  Discontinued         Does not apply See admin instructions 05/24/20 1713 05/25/20 0936   05/24/20 1645  metroNIDAZOLE (FLAGYL) IVPB 500 mg        500 mg 100 mL/hr over 60 Minutes Intravenous  Once 05/24/20 1644 05/24/20 1850      Medications: Scheduled Meds: . Chlorhexidine Gluconate Cloth  6 each Topical Daily  . feeding supplement  237 mL Oral BID BM  . heparin  5,000 Units Subcutaneous Q8H  . multivitamin with minerals  1 tablet Oral Daily  . sodium  chloride flush  10 mL Intracatheter Q8H  . sodium chloride flush  10-40 mL Intracatheter Q12H   Continuous Infusions: .  ceFAZolin (ANCEF) IV 2 g (06/08/20 0523)   PRN Meds:.acetaminophen, albuterol, docusate sodium, ondansetron, oxyCODONE, polyethylene glycol, sodium chloride flush    Objective: Weight change: -1.179 kg  Intake/Output Summary (Last 24 hours) at 06/08/2020 1259 Last data filed at 06/08/2020 1226 Gross per 24 hour  Intake 710 ml  Output 1475 ml  Net -765 ml   Blood pressure 107/64, pulse (!) 103, temperature 99.5 F (37.5 C), temperature source Oral, resp. rate 20, height 6' 3"  (1.905 m), weight 61.3 kg, SpO2 98 %. Temp:  [99.5 F (37.5 C)-101.3 F (38.5 C)] 99.5 F (37.5 C) (04/18 1116) Pulse Rate:  [86-103] 103 (04/18 1116) Resp:  [20] 20 (04/18 1116) BP: (107-115)/(64-76) 107/64 (04/18 1116) SpO2:  [98 %] 98 % (04/18 1116) Weight:  [61.3 kg] 61.3 kg (04/18 0523)  Physical Exam: Physical Exam Constitutional:      Appearance: Edward Mueller is cachectic.  HENT:     Head: Normocephalic and atraumatic.  Eyes:     Conjunctiva/sclera: Conjunctivae normal.  Cardiovascular:     Rate and Rhythm: Normal rate and regular rhythm.  Pulmonary:     Effort: No respiratory distress.     Breath sounds: Examination of the right-lower field reveals decreased breath sounds. Decreased breath sounds present. No wheezing.  Abdominal:     General: There is no distension.     Palpations: Abdomen is soft.  Musculoskeletal:        General: Normal range of motion.     Cervical back: Normal range of motion and neck supple.  Skin:    General: Skin is warm and dry.     Findings: No erythema or rash.  Neurological:     General: No focal deficit present.     Mental Status: Edward Mueller is alert and oriented to person, place, and time.  Psychiatric:        Mood and Affect: Mood normal.        Behavior: Behavior normal.        Thought Content: Thought content normal.        Judgment: Judgment  normal.      CBC:    BMET Recent Labs    06/07/20 0500 06/08/20 0500  NA 133* 132*  K 4.4 4.4  CL 99 99  CO2 26 27  GLUCOSE 98 98  BUN 9 10  CREATININE 0.97 1.01  CALCIUM 7.9* 8.0*     Liver Panel  Recent Labs    06/06/20 0500  PROT 4.7*  ALBUMIN 1.4*  AST 48*  ALT 36  ALKPHOS 48  BILITOT 0.5  BILIDIR <0.1  IBILI NOT CALCULATED       Sedimentation Rate No results for input(s): ESRSEDRATE in the last 72 hours. C-Reactive Protein No results for input(s): CRP in the last 72 hours.  Micro Results: Recent Results (from the past 720 hour(s))  Blood Culture (routine x 2)     Status: Abnormal   Collection Time: 05/24/20  3:08 PM   Specimen: Left Antecubital; Blood  Result Value Ref Range Status   Specimen Description LEFT ANTECUBITAL  Final   Special Requests   Final    BOTTLES DRAWN AEROBIC AND ANAEROBIC Blood Culture results may not be optimal due to an inadequate volume of blood received in culture bottles   Culture  Setup Time   Final    GRAM POSITIVE COCCI IN CLUSTERS IN BOTH AEROBIC AND ANAEROBIC BOTTLES CRITICAL RESULT CALLED TO, READ BACK BY AND VERIFIED WITH: PHARMD A WOLF [id number] AT 1 AM BY CM Performed at [facility], [place] [address] [place],  [address]    Culture STAPHYLOCOCCUS AUREUS (A)  Final   Report Status 05/27/2020 FINAL  Final   Organism ID, Bacteria STAPHYLOCOCCUS AUREUS  Final      Susceptibility   Staphylococcus aureus - MIC*    CIPROFLOXACIN <=0.5 SENSITIVE Sensitive     ERYTHROMYCIN >=8 RESISTANT Resistant     GENTAMICIN <=0.5 SENSITIVE Sensitive     OXACILLIN 0.5 SENSITIVE Sensitive     TETRACYCLINE <=1 SENSITIVE Sensitive     VANCOMYCIN <=0.5 SENSITIVE Sensitive     TRIMETH/SULFA <=10 SENSITIVE Sensitive     CLINDAMYCIN <=0.25 SENSITIVE Sensitive     RIFAMPIN <=0.5 SENSITIVE Sensitive     Inducible Clindamycin NEGATIVE Sensitive     * STAPHYLOCOCCUS AUREUS  Blood Culture ID Panel (Reflexed)     Status:  Abnormal   Collection Time: 05/24/20  3:08 PM  Result Value Ref  Range Status   Enterococcus faecalis NOT DETECTED NOT DETECTED Final   Enterococcus Faecium NOT DETECTED NOT DETECTED Final   Listeria monocytogenes NOT DETECTED NOT DETECTED Final   Staphylococcus species DETECTED (A) NOT DETECTED Final    Comment: CRITICAL RESULT CALLED TO, READ BACK BY AND VERIFIED WITH: PHARMD A WOLF [id number] AT 800 BY CM    Staphylococcus aureus (BCID) DETECTED (A) NOT DETECTED Final    Comment: CRITICAL RESULT CALLED TO, READ BACK BY AND VERIFIED WITH: PHARMD A WOLF [id number] AT 800 BY CM    Staphylococcus epidermidis NOT DETECTED NOT DETECTED Final   Staphylococcus lugdunensis NOT DETECTED NOT DETECTED Final   Streptococcus species NOT DETECTED NOT DETECTED Final   Streptococcus agalactiae NOT DETECTED NOT DETECTED Final   Streptococcus pneumoniae NOT DETECTED NOT DETECTED Final   Streptococcus pyogenes NOT DETECTED NOT DETECTED Final   A.calcoaceticus-baumannii NOT DETECTED NOT DETECTED Final   Bacteroides fragilis NOT DETECTED NOT DETECTED Final   Enterobacterales NOT DETECTED NOT DETECTED Final   Enterobacter cloacae complex NOT DETECTED NOT DETECTED Final   Escherichia coli NOT DETECTED NOT DETECTED Final   Klebsiella aerogenes NOT DETECTED NOT DETECTED Final   Klebsiella oxytoca NOT DETECTED NOT DETECTED Final   Klebsiella pneumoniae NOT DETECTED NOT DETECTED Final   Proteus species NOT DETECTED NOT DETECTED Final   Salmonella species NOT DETECTED NOT DETECTED Final   Serratia marcescens NOT DETECTED NOT DETECTED Final   Haemophilus influenzae NOT DETECTED NOT DETECTED Final   Neisseria meningitidis NOT DETECTED NOT DETECTED Final   Pseudomonas aeruginosa NOT DETECTED NOT DETECTED Final   Stenotrophomonas maltophilia NOT DETECTED NOT DETECTED Final   Candida albicans NOT DETECTED NOT DETECTED Final   Candida auris NOT DETECTED NOT DETECTED Final   Candida glabrata NOT DETECTED NOT DETECTED  Final   Candida krusei NOT DETECTED NOT DETECTED Final   Candida parapsilosis NOT DETECTED NOT DETECTED Final   Candida tropicalis NOT DETECTED NOT DETECTED Final   Cryptococcus neoformans/gattii NOT DETECTED NOT DETECTED Final   Meth resistant mecA/C and MREJ NOT DETECTED NOT DETECTED Final    Comment: Performed at [facility], [address] [place], [place] [address]  Resp Panel by RT-PCR (Flu A&B, Covid) Nasopharyngeal Swab     Status: None   Collection Time: 05/24/20  3:14 PM   Specimen: Nasopharyngeal Swab; Nasopharyngeal(NP) swabs in vial transport medium  Result Value Ref Range Status   SARS Coronavirus 2 by RT PCR NEGATIVE NEGATIVE Final    Comment: (NOTE) SARS-CoV-2 target nucleic acids are NOT DETECTED.  The SARS-CoV-2 RNA is generally detectable in upper respiratory specimens during the acute phase of infection. The lowest concentration of SARS-CoV-2 viral copies this assay can detect is 138 copies/mL. A negative result does not preclude SARS-Cov-2 infection and should not be used as the sole basis for treatment or other patient management decisions. A negative result may occur with  improper specimen collection/handling, submission of specimen other than nasopharyngeal swab, presence of viral mutation(s) within the areas targeted by this assay, and inadequate number of viral copies(<138 copies/mL). A negative result must be combined with clinical observations, patient history, and epidemiological information. The expected result is Negative.  Fact Sheet for Patients:  EntrepreneurPulse.com.au  Fact Sheet for Healthcare Providers:  IncredibleEmployment.be  This test is no t yet approved or cleared by the [place] FDA and  has been authorized for detection and/or diagnosis of SARS-CoV-2 by FDA under an Emergency Use Authorization (EUA). This EUA will  remain  in effect (meaning this test can be used) for the duration of  the COVID-19 declaration under Section 564(b)(1) of the Act, 21 U.S.C.section 360bbb-3(b)(1), unless the authorization is terminated  or revoked sooner.       Influenza A by PCR NEGATIVE NEGATIVE Final   Influenza B by PCR NEGATIVE NEGATIVE Final    Comment: (NOTE) The Xpert Xpress SARS-CoV-2/FLU/RSV plus assay is intended as an aid in the diagnosis of influenza from Nasopharyngeal swab specimens and should not be used as a sole basis for treatment. Nasal washings and aspirates are unacceptable for Xpert Xpress SARS-CoV-2/FLU/RSV testing.  Fact Sheet for Patients: EntrepreneurPulse.com.au  Fact Sheet for Healthcare Providers: IncredibleEmployment.be  This test is not yet approved or cleared by the [place] FDA and has been authorized for detection and/or diagnosis of SARS-CoV-2 by FDA under an Emergency Use Authorization (EUA). This EUA will remain in effect (meaning this test can be used) for the duration of the COVID-19 declaration under Section 564(b)(1) of the Act, 21 U.S.C. section 360bbb-3(b)(1), unless the authorization is terminated or revoked.  Performed at [facility], [place] [address], [place], [place] [address]   Blood Culture (routine x 2)     Status: Abnormal   Collection Time: 05/24/20  9:45 PM   Specimen: BLOOD LEFT HAND  Result Value Ref Range Status   Specimen Description BLOOD LEFT HAND  Final   Special Requests   Final    BOTTLES DRAWN AEROBIC AND ANAEROBIC Blood Culture results may not be optimal due to an inadequate volume of blood received in culture bottles   Culture  Setup Time   Final    GRAM POSITIVE COCCI IN CLUSTERS IN BOTH AEROBIC AND ANAEROBIC BOTTLES CRITICAL VALUE NOTED.  VALUE IS CONSISTENT WITH PREVIOUSLY REPORTED AND CALLED VALUE.    Culture (A)  Final    STAPHYLOCOCCUS AUREUS SUSCEPTIBILITIES PERFORMED ON PREVIOUS CULTURE WITHIN THE LAST 5 DAYS. Performed at [facility], [place] [address] [place], [place] [address]    Report Status 05/27/2020 FINAL  Final  MRSA PCR Screening     Status: None   Collection Time: 05/24/20 11:46 PM   Specimen: Nasopharyngeal  Result Value Ref Range Status   MRSA by PCR NEGATIVE NEGATIVE Final    Comment:        The GeneXpert MRSA Assay (FDA approved for NASAL specimens only), is one component of a comprehensive MRSA colonization surveillance program. It is not intended to diagnose MRSA infection nor to guide or monitor treatment for MRSA infections. Performed at [facility], [place] [address] [place], [place] [address]   Urine culture     Status: None   Collection Time: 05/24/20 11:59 PM   Specimen: In/Out Cath Urine  Result Value Ref Range Status   Specimen Description IN/OUT CATH URINE  Final   Special Requests NONE  Final   Culture   Final    NO GROWTH Performed at [facility], [place] [address], [place],  [address]    Report Status 05/26/2020 FINAL  Final  Culture, blood (routine x 2)     Status: None   Collection Time: 05/25/20 10:19 PM   Specimen: BLOOD LEFT HAND  Result Value Ref Range Status   Specimen Description BLOOD LEFT HAND  Final   Special Requests   Final    BOTTLES DRAWN AEROBIC AND ANAEROBIC Blood Culture adequate volume   Culture   Final    NO GROWTH  5 DAYS Performed at [facility], [place] [address] [place], [place] [address]    Report Status 05/30/2020 FINAL  Final  Culture, blood (routine x 2)     Status: None   Collection Time: 05/25/20 10:32 PM   Specimen: BLOOD RIGHT HAND  Result Value Ref Range Status   Specimen Description BLOOD RIGHT HAND  Final   Special Requests AEROBIC BOTTLE ONLY Blood Culture adequate volume  Final   Culture   Final    NO GROWTH 5 DAYS Performed at [facility], [place] [address], [place], [place] [address]    Report Status 05/30/2020 FINAL  Final  Expectorated Sputum Assessment w Gram Stain, Rflx to Resp Cult     Status: None    Collection Time: 05/29/20  7:11 AM   Specimen: Expectorated Sputum  Result Value Ref Range Status   Specimen Description EXPECTORATED SPUTUM  Final   Special Requests Normal  Final   Sputum evaluation   Final    THIS SPECIMEN IS ACCEPTABLE FOR SPUTUM CULTURE Performed at [facility], [place] [address], [place], [place] [address]    Report Status 05/29/2020 FINAL  Final  Culture, Respiratory w Gram Stain     Status: None   Collection Time: 05/29/20  7:11 AM  Result Value Ref Range Status   Specimen Description EXPECTORATED SPUTUM  Final   Special Requests Normal Reflexed from [id number]  Final   Gram Stain   Final    ABUNDANT WBC PRESENT,BOTH PMN AND MONONUCLEAR FEW SQUAMOUS EPITHELIAL CELLS PRESENT ABUNDANT GRAM NEGATIVE RODS ABUNDANT GRAM POSITIVE COCCI FEW YEAST    Culture   Final    FEW Consistent with normal respiratory flora. No Pseudomonas species isolated Performed at [place] [address], [place], [place] [address]    Report Status 06/01/2020 FINAL  Final  Culture, blood (routine x 2)     Status: None   Collection Time: 05/31/20  8:07 AM   Specimen: BLOOD LEFT HAND  Result Value Ref Range Status   Specimen Description BLOOD LEFT HAND  Final   Special Requests   Final    BOTTLES DRAWN AEROBIC AND ANAEROBIC Blood Culture adequate volume   Culture   Final    NO GROWTH 5 DAYS Performed at [facility], [place] [address] [place], [place] [address]    Report Status 06/05/2020 FINAL  Final  Culture, blood (routine x 2)     Status: None   Collection Time: 05/31/20  8:12 AM   Specimen: BLOOD RIGHT HAND  Result Value Ref Range Status   Specimen Description BLOOD RIGHT HAND  Final   Special Requests   Final    BOTTLES DRAWN AEROBIC ONLY Blood Culture results may not be optimal due to an inadequate volume of blood received in culture bottles   Culture   Final    NO GROWTH 5 DAYS Performed at [facility], [place] [address] [place], [place]  [address]    Report Status 06/05/2020 FINAL  Final  Gram stain     Status: None   Collection Time: 05/31/20 11:52 AM   Specimen: Fluid  Result Value Ref Range Status   Specimen Description FLUID  Final   Special Requests PLEURAL FLUID LEFT  Final   Gram Stain   Final    FEW WBC PRESENT, PREDOMINANTLY PMN NO ORGANISMS SEEN Performed at [facility], [place] [address] [place], [place] [address]  Report Status 05/31/2020 FINAL  Final  Culture, body fluid w Gram Stain-bottle     Status: None   Collection Time: 05/31/20 11:52 AM   Specimen: Fluid  Result Value Ref Range Status   Specimen Description FLUID  Final   Special Requests PLEURAL FLUID LEFT  Final   Culture   Final    NO GROWTH 5 DAYS Performed at [place] [address] [place], [place] [address]    Report Status 06/05/2020 FINAL  Final  Culture, blood (routine x 2)     Status: None (Preliminary result)   Collection Time: 06/04/20  9:54 PM   Specimen: BLOOD LEFT HAND  Result Value Ref Range Status   Specimen Description BLOOD LEFT HAND  Final   Special Requests   Final    BOTTLES DRAWN AEROBIC AND ANAEROBIC Blood Culture adequate volume   Culture   Final    NO GROWTH 4 DAYS Performed at [facility], [place] [address], [place], [place] [address]    Report Status PENDING  Incomplete  Culture, blood (routine x 2)     Status: None (Preliminary result)   Collection Time: 06/04/20 10:07 PM   Specimen: BLOOD LEFT HAND  Result Value Ref Range Status   Specimen Description BLOOD LEFT HAND  Final   Special Requests   Final    BOTTLES DRAWN AEROBIC AND ANAEROBIC Blood Culture adequate volume   Culture   Final    NO GROWTH 4 DAYS Performed at [facility], [place] [address], [place], [place] [address]    Report Status PENDING  Incomplete    Studies/Results: DG CHEST PORT 1 VIEW  Result Date: 06/08/2020 CLINICAL DATA:  Shortness of breath, chest pain. EXAM: PORTABLE CHEST 1 VIEW COMPARISON:  June 05, 2020. FINDINGS: Mild improvement in left basilar opacities. Otherwise, similar patchy multifocal opacities. Similar numerous cystic lucencies, compatible with cavitary lesion seen on prior CT chest. No visible pneumothorax on this semi erect radiograph. Suspected small bilateral pleural effusions, unchanged. Similar cardiac silhouette, within normal limits. Similar positioning of a right upper extremity PICC. IMPRESSION: 1. Slight improvement in left basilar opacities. Otherwise, similar multifocal opacities and cystic lucencies. 2. 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   Edward Mueller is continue to have some fevers although the trajectory is continue to improve as is his white blood cell count.  We will plan on giving him 6 weeks of IV cefazolin.  Diagnosis: MSSA bacteremia endocarditis septic embolization left arm infection  Culture Result: MSSA  No Known Allergies  OPAT Orders Discharge antibiotics:  Cefazolin    Duration:  6 weeksEnd Date:  07/06/2020  [facility] Care Per Protocol:    Labs  weekly while on IV antibiotics: _x_ CBC with differential _x_ BMP w GFR/CMP _x_ CRP _x_ ESR  _x_ Please pull PIC at completion of IV antibiotics __ Please leave PIC in place until doctor has seen patient or been notified  Fax weekly labs to  [phone]   Edward Mueller has an appointment on 07/03/2020 at 330 PM with Dr. Tommy Medal  The [facility] is located in the [facility]  [place] in [place].  [address], which is located to the left of the elevators.  Phone: [phone]  Fax: [phone]  [email or web address]  Edward Mueller should arrive 15 to 30 minutes prior to his appointment.  I spent greater than 35  minutes with the patient including greater than 50% of time in face to face counsel of the patient and in coordination of their care.   I will sign off for now please call further questions.   LOS: 15 days   Alcide Evener 06/08/2020, 12:59 PM

## 2020-06-08 NOTE — Progress Notes (Signed)
Physical Therapy Treatment Patient Details Name: Edward Mueller MRN: [id number] DOB: 10/14/1970 Today's Date: 06/08/2020    History of Present Illness Pt is a 50 y.o. 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 Chest CT shows multiple peripheral infiltrates consistent with embolic phenomena; concern for endocarditis. Workup for compensated septic shock due to LUE cellulitis, MSSA bactermia of unclear source. S/p TEE 4/6 with no evidence of endocarditis. UDS (+) THC. Pt with loculated R pleural effusion s/p R chest tube placement 4/9. S/p L thoracentesis 4/10. S/p chest tube removal 4/12. No reported PMH; pt reports donating plasma 2x/wk.   PT Comments    Pt progressing well with mobility. Tolerated gait training and increased ambulation distance this session; still requiring intermittent cues for pursed lip breathing and activity pacing. Pt at times breathing 53 breaths/min with HR up to 110s; SpO2 95% on RA. Encouraged more frequent activity with nursing staff. Will prioritize stair training next session (pt's mom has 3-4 steps to enter home).     Follow Up Recommendations  Home health PT     Equipment Recommendations  Rolling walker with 5" wheels    Recommendations for Other Services       Precautions / Restrictions Precautions Precautions: Fall;Other (comment) Precaution Comments: SOB with minimal exertion Restrictions Weight Bearing Restrictions: No    Mobility  Bed Mobility Overal bed mobility: Modified Independent Bed Mobility: Supine to Sit           General bed mobility comments: increased time and effort with increased WOB/coughing    Transfers Overall transfer level: Needs assistance Equipment used: Rolling walker (2 wheeled) Transfers: Sit to/from Stand Sit to Stand: Supervision         General transfer comment: Multiple sit<>stands from  EOB, toilet and recliner to RW with supervision  Ambulation/Gait Ambulation/Gait assistance: Supervision Gait Distance (Feet): 230 Feet Assistive device: Rolling walker (2 wheeled) Gait Pattern/deviations: Step-through pattern;Decreased stride length;Trunk flexed Gait velocity: Decreased   General Gait Details: Pt requesting RW for hallway ambulation; able to walk initial ~215' with RW and supervision for safety, 3-4x standing rest breaks secondary to SOB, noted pt with 53 breaths/min, SpO2 95% on RA, HR up to 110s; short ambulation distance in room (~15') without DME and min guard for balance   Stairs Stairs:  (pt declined secondary to fatigue post-ambulation; will prioritize next session)           Wheelchair Mobility    Modified Rankin (Stroke Patients Only)       Balance Overall balance assessment: Needs assistance Sitting-balance support: No upper extremity supported;Feet supported Sitting balance-Leahy Scale: Good     Standing balance support: No upper extremity supported Standing balance-Leahy Scale: Fair Standing balance comment: can static stand and take steps without UE support                            Cognition Arousal/Alertness: Awake/alert Behavior During Therapy: WFL for tasks assessed/performed;Flat affect Overall Cognitive Status: Within Functional Limits for tasks assessed                                        Exercises      General Comments General comments (skin integrity, edema, etc.): pt with 53 breaths/min, SpO2 95% on RA, HR up to 110s. Intermittent  verbal cues for pursed lip breathing. Pt's friend present and supportive      Pertinent Vitals/Pain Pain Assessment: Faces Faces Pain Scale: Hurts a little bit Pain Location: Chest with coughing Pain Descriptors / Indicators: Sore Pain Intervention(s): Monitored during session    Home Living                      Prior Function            PT  Goals (current goals can now be found in the care plan section) Progress towards PT goals: Progressing toward goals    Frequency    Min 3X/week      PT Plan Current plan remains appropriate    Co-evaluation              AM-PAC PT "6 Clicks" Mobility   Outcome Measure  Help needed turning from your back to your side while in a flat bed without using bedrails?: None Help needed moving from lying on your back to sitting on the side of a flat bed without using bedrails?: None Help needed moving to and from a bed to a chair (including a wheelchair)?: A Little Help needed standing up from a chair using your arms (e.g., wheelchair or bedside chair)?: A Little Help needed to walk in hospital room?: A Little Help needed climbing 3-5 steps with a railing? : A Little 6 Click Score: 20    End of Session Equipment Utilized During Treatment: Gait belt Activity Tolerance: Patient tolerated treatment well Patient left: in chair;with call bell/phone within reach;with family/visitor present Nurse Communication: Mobility status PT Visit Diagnosis: Other abnormalities of gait and mobility (R26.89);Other (comment) (impaired cardiorespiratory status)     Time: 4801-6553 PT Time Calculation (min) (ACUTE ONLY): 24 min  Charges:  $Gait Training: 8-22 mins $Therapeutic Exercise: 8-22 mins                     Ina Homes, PT, DPT Acute Rehabilitation Services  Pager [phone] Office [phone]  Malachy Chamber 06/08/2020, 5:36 PM

## 2020-06-09 DIAGNOSIS — R6520 Severe sepsis without septic shock: Secondary | ICD-10-CM

## 2020-06-09 DIAGNOSIS — A419 Sepsis, unspecified organism: Secondary | ICD-10-CM

## 2020-06-09 LAB — CULTURE, BLOOD (ROUTINE X 2)
Culture: NO GROWTH
Culture: NO GROWTH
Special Requests: ADEQUATE
Special Requests: ADEQUATE

## 2020-06-09 MED ORDER — ADULT MULTIVITAMIN W/MINERALS CH: 1.0000 | ORAL_TABLET | Freq: Every day | ORAL | Status: AC

## 2020-06-09 MED ORDER — HEPARIN SOD (PORK) LOCK FLUSH 100 UNIT/ML IV SOLN
250.0000 [IU] | INTRAVENOUS | Status: AC | PRN
  Administered 2020-06-09: 250 [IU]
  Filled 2020-06-09: qty 2.5

## 2020-06-09 NOTE — Discharge Summary (Signed)
Physician Discharge Summary   Edward Mueller ZOX:[id number] DOB: 06-24-1970 DOA: 05/24/2020  PCP: Merryl Hacker, No  Admit date: 05/24/2020 Discharge date:  06/09/2020   Admitted From: home Disposition:  home Discharging physician: Dwyane Dee, MD  Recommendations for Outpatient Follow-up:  1. Follow-up with infectious disease   Patient discharged to home in Discharge Condition: stable Risk of unplanned readmission score: Unplanned Admission- Pilot do not use: 12.26  CODE STATUS: Full Diet recommendation:  Diet Orders (From admission, onward)    Start     Ordered   06/09/20 0000  Diet general        06/09/20 1526   05/30/20 1421  Diet regular Room service appropriate? Yes; Fluid consistency: Thin  Diet effective now       Question Answer Comment  Room service appropriate? Yes   Fluid consistency: Thin      05/30/20 1421          Hospital Course: 50 year old gentleman with no reported medical problems who donates plasma 2 times a week, donated plasma on 05/22/20 and after about 24 hours he started not feeling well. Reported symptoms included generalized malaise, nausea, vomiting, shortness of breath, pleuritic chest pain, cough with blood-tinged sputum. Came to the emergency room on 05/24/20, was febrile, tachypneic with lactic acidosis. Found to have multilobular pneumonia on chest x-ray, left upper extremity cellulitis and left acute superficial vein thrombosis involving the left cephalic vein on 09/21/1912.Was started on IV antibiotics and IV fluids and admitted to ICU for closer monitoring then later transferred to progressive care unit.  Work-up revealed MSSA bacteremia for which infectious disease was consulted.  No vegetation was seen on TEE on 05/27/2020. Sputum culture from 05/29/2020 growing abundant gram-negative rods, abundant gram-positive cocci with few yeast, culture was reincubated for better growth. He has been on cefazolin for MSSA bacteremia as recommended by infectious  disease. Repeated CT chest without contrast done on 05/30/2020 showed interval worsening of the aeration of the lungs. IR was consulted for thoracentesis and chest tube placement due to loculated pleural effusion.   Post right thoracentesis by IR on 05/30/2020 with approximately 300 cc translucent, serosanguineous fluid removed and right chest tube placement. Post left thoracentesis by IR on 05/31/2020 with 550 cc dark red fluid removed. Chest tube was managed by PCCM.  Right-sided chest tube was removed on 06/02/2020 with no reaccumulation of fluid.  He remained stable with intermittent fevers which were expected in setting of his cavitating lung lesions.  He underwent antibiotic administration teaching prior to discharge with his mom present and ultimately felt comfortable discharging home with ongoing management.  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 No evidence of valvular vegetation on TEE. Appreciate infectious disease and PCCM assistance. He is currently on IV cefazolin, continue as recommended by infectious disease.   - Plan is for 6 weeks treatment, end date 07/06/2020 - recurrent fevers, BCx were repeated on 06/04/20: negative to date - BCx from 4/4 and 4/10 are negative -  fever curve stable and expected given cavitary lesions (will take weeks to resolve) -PICC line placed 06/04/2020 - continue IS - repeat CXR on 4/18 is stable; no reaccumulation.   Acute hypoxic respiratory failure 2/2 PNA Septic pulmonary emboli with cavitating lesions  Loculated right parapneumonic effusion, simple left parapneumonic effusion On IV antibiotics as stated above. Post left and right thoracentesis and right chest tube placement by IR. -Right chest tube removed on 06/02/2020.  No significant reaccumulation on CXR obtained  on 06/03/2020 and 06/05/20 - s/p right chest tube removed 06/02/20 Continue to maintain O2 saturation greater than 90%.  Now on RA - continue IS  Severe protein calorie malnutrition due to severe critical illness Albumin 1.4 on 05/31/2020 BMI 19 Dietitian consulted for assessment of nutrition requirements Continue to encourage increase in oral protein calorie intake.  Resolved post repletion. Hypocalcemia  Resolved Non anion gap metabolic acidosis  AKI on CKD 3B, improving. Presented with creatinine of 2.7 Creatinine downtrending.  Creatinine 1.01 at discharge  Hypovolemic hyponatremia Encourage increase in oral intake - stable    Hypokalemia Repleted Repeat BMP as needed  Hypophosphatemia - repleted   Resolved left upper extremity cellulitis Initially presented with left upper extremity cellulitis which has been treated and has now resolved  Left upper extremity acute superficial vein thrombosis Continue to monitor  THC/tobacco use UDS positive for THC on 05/24/2020 Denies use of cocaine or intravenous drug abuse Cessation counseling when hemodynamically stable.  Generalized weakness in the setting of severe, critical illness Out of bed to chair with every shift as tolerated Ambulate patient with every shift as tolerated Increase oral protein calorie intake as tolerated PT assessed with no further recommendations. Continue fall precautions.   The patient's chronic medical conditions were treated accordingly per the patient's home medication regimen except as noted.  On day of discharge, patient was felt deemed stable for discharge. Patient/family member advised to call PCP or come back to ER if needed.   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 Bacteremia     Priority: High  . Severe sepsis ([place]) 06/09/2020  . Protein-calorie malnutrition,  severe 06/03/2020  . AKI (acute kidney injury) ([place])   . Hypoxia   . Pleural effusion on left   . S/P thoracentesis     Resolved Hospital Problems   Diagnosis Date Noted Date Resolved  . Loculated pleural effusion 06/06/2020 06/09/2020    Priority: Medium  . FUO (fever of unknown origin)  06/09/2020    Priority: Medium  . Shortness of breath  06/06/2020  . Encounter for chest tube removal  06/06/2020  . Chest tube in place  06/06/2020  . Acute respiratory failure with hypoxia ([place])  06/06/2020    Discharge Instructions    [facility] pharmacist to adjust dose for Vancomycin, Aminoglycosides and other anti-infective therapies as requested by physician.   Complete by: As directed    [facility] to provide Cath Flo 9m   Complete by: As directed    Administer for PICC line occlusion and as ordered by physician for other access device issues.   Anaphylaxis Kit: Provided to treat any anaphylactic reaction to the medication being provided to the patient if First Dose or when requested by physician   Complete by: As directed    Epinephrine 156mml vial / amp: Administer 0.68m58m0.68ml80mubcutaneously once for moderate to severe anaphylaxis, nurse to call physician and pharmacy when reaction occurs and call 911 if needed for immediate care   Diphenhydramine 50mg668mIV vial: Administer 25-50mg 69mM PRN for first dose reaction, rash, itching, mild reaction, nurse to call physician and pharmacy when reaction occurs   Sodium Chloride 0.9% NS 500ml I45mdminister if needed for hypovolemic blood pressure drop or as ordered by physician after call to physician with  anaphylactic reaction   Change dressing on IV access line weekly and PRN   Complete by: As directed    Diet general   Complete by: As directed    Flush IV access with Sodium Chloride 0.9% and Heparin 10 units/ml or 100 units/ml   Complete by: As directed    Home infusion instructions - [facility]    Complete by: As directed    Instructions: Flush IV access with Sodium Chloride 0.9% and Heparin 10units/ml or 100units/ml   Change dressing on IV access line: Weekly and PRN   Instructions Cath Flo 32m: Administer for PICC Line occlusion and as ordered by physician for other access device   [facility] pharmacist to adjust dose for: Vancomycin, Aminoglycosides and other anti-infective therapies as requested by physician   Increase activity slowly   Complete by: As directed    Method of administration may be changed at the discretion of home infusion pharmacist based upon assessment of the patient and/or caregiver's ability to self-administer the medication ordered   Complete by: As directed    No wound care   Complete by: As directed      Allergies as of 06/09/2020   No Known Allergies     Medication List    TAKE these medications   ceFAZolin  IVPB Commonly known as: ANCEF Inject 2 g into the vein every 8 (eight) hours. Indication: Bacteremia First Dose: Yes Last Day of Therapy: 07/06/20 Labs - Once weekly:  CBC/D and BMP, Labs - Every other week:  ESR and CRP Method of administration: IV Push Method of administration may be changed at the discretion of home infusion pharmacist based upon assessment of the patient and/or caregiver's ability to self-administer the medication ordered.   multivitamin with minerals Tabs tablet Take 1 tablet by mouth daily. Start taking on: June 10, 2020            Discharge Care Instructions  (From admission, onward)         Start     Ordered   06/02/20 0000  Change dressing on IV access line weekly and PRN  (Home infusion instructions - [facility] )        06/02/20 0544          Follow-up Information    [facility] on 07/13/2020.   Why: for hospital follow/up appointment @ 2:30 with Dr. NMargarita Rana If you cannot make this scheduled appointment; please call the office to  reschedule. Onsite pharmacy medications range from $4.00-$10.00.  Contact information: [address] [place] [phone]      [facility] Follow up.   Why: IV infusion company       [facility] Follow [facility].   Why: Home Health Registered Nurse- start of care to begin Friday.  Contact information:  [address]#[place] [phone]      [facility] CLavell Islam MD. Go on 07/03/2020.   Specialty: Infectious Diseases Why: 3:30 pm Contact information: [phone]             No Known Allergies  Consultations: ID Pulmonology  Discharge Exam: BP 115/73 (BP Location: Left Arm)   Pulse 90   Temp 99.8 F (37.7 C) (Oral)   Resp 16   Ht 6' 3"  (1.905 m)   Wt 61.1 kg   SpO2 97%  BMI 16.84 kg/m  General appearance: alert, cooperative and no distress Head: Normocephalic, without obvious abnormality, atraumatic Eyes: EOMI Back: No further tenderness over prior chest tube insertion site Lungs: improving but still diminished right sided breath sounds, no wheezing Heart: regular rate and rhythm and S1, S2 normal Abdomen: normal findings: bowel sounds normal and soft, non-tender Extremities: no edema Skin: mobility and turgor normal Neurologic: Grossly normal  The results of significant diagnostics from this hospitalization (including imaging, microbiology, ancillary and laboratory) are listed below for reference.   Microbiology: Recent Results (from the past 240 hour(s))  Culture, blood (routine x 2)     Status: None   Collection Time: 05/31/20  8:07 AM   Specimen: BLOOD LEFT HAND  Result Value Ref Range Status   Specimen Description BLOOD LEFT HAND  Final   Special Requests   Final    BOTTLES DRAWN AEROBIC AND ANAEROBIC Blood Culture adequate volume   Culture   Final    NO GROWTH 5 DAYS Performed at [facility], [address],  [place], [place] [address]    Report Status 06/05/2020 FINAL  Final  Culture, blood (routine x 2)     Status: None   Collection Time: 05/31/20  8:12 AM   Specimen: BLOOD RIGHT HAND  Result Value Ref Range Status   Specimen Description BLOOD RIGHT HAND  Final   Special Requests   Final    BOTTLES DRAWN AEROBIC ONLY Blood Culture results may not be optimal due to an inadequate volume of blood received in culture bottles   Culture   Final    NO GROWTH 5 DAYS Performed at [facility], [place] [address], [place], [place] [address]    Report Status 06/05/2020 FINAL  Final  Gram stain     Status: None   Collection Time: 05/31/20 11:52 AM   Specimen: Fluid  Result Value Ref Range Status   Specimen Description FLUID  Final   Special Requests PLEURAL FLUID LEFT  Final   Gram Stain   Final    FEW WBC PRESENT, PREDOMINANTLY PMN NO ORGANISMS SEEN Performed at [facility], [place] [address], [place], [place] [address]    Report Status 05/31/2020 FINAL  Final  Culture, body fluid w Gram Stain-bottle     Status: None   Collection Time: 05/31/20 11:52 AM   Specimen: Fluid  Result Value Ref Range Status   Specimen Description FLUID  Final   Special Requests PLEURAL FLUID LEFT  Final   Culture   Final    NO GROWTH 5 DAYS Performed at [facility] [address] [place], [place] [address]    Report Status 06/05/2020 FINAL  Final  Culture, blood (routine x 2)     Status: None   Collection Time: 06/04/20  9:54 PM   Specimen: BLOOD LEFT HAND  Result Value Ref Range Status   Specimen Description BLOOD LEFT HAND  Final   Special Requests   Final    BOTTLES DRAWN AEROBIC AND ANAEROBIC Blood Culture adequate volume   Culture   Final    NO GROWTH 5 DAYS Performed at [facility], [place] [address], [place], [place] [address]    Report Status 06/09/2020 FINAL  Final  Culture, blood (routine x 2)     Status: None   Collection Time: 06/04/20 10:07 PM   Specimen: BLOOD LEFT HAND   Result Value Ref Range Status   Specimen Description BLOOD LEFT HAND  Final   Special Requests   Final    BOTTLES DRAWN AEROBIC AND ANAEROBIC Blood Culture adequate volume   Culture   Final    NO GROWTH 5 DAYS Performed at [facility], [address], [place], [place] [address]    Report Status 06/09/2020 FINAL  Final     Labs: BNP (last 3 results) Recent Labs    05/24/20 1657 05/29/20 0308  BNP 104.9* 974.1*   Basic Metabolic Panel: Recent Labs  Lab 06/03/20 0145 06/04/20 0309 06/05/20 0500 06/06/20 0500 06/07/20 0500 06/08/20 0500  NA 130* 134* 134* 134* 133* 132*  K 3.6 3.9 4.3 4.4 4.4 4.4  CL 95* 99 100 103 99 99  CO2 28 26 27 27 26 27   GLUCOSE 98 103* 105* 99 98 98  BUN 20 16 12 11 9 10   CREATININE 1.08 1.00 1.01 0.99 0.97 1.01  CALCIUM 7.2* 7.3* 7.6* 7.5* 7.9* 8.0*  MG 2.1 1.9 1.9 1.7 1.9 1.9  PHOS 3.2  --   --   --   --   --    Liver Function Tests: Recent Labs  Lab 06/06/20 0500  AST 48*  ALT 36  ALKPHOS 48  BILITOT 0.5  PROT 4.7*  ALBUMIN 1.4*   No results for input(s): LIPASE, AMYLASE in the last 168 hours. No results for input(s): AMMONIA in the last 168 hours. CBC: Recent Labs  Lab 06/04/20 0309 06/05/20 0500 06/06/20 0500 06/07/20 0500 06/08/20 0500  WBC 23.2* 21.4* 18.6* 14.9* 14.5*  NEUTROABS  --  19.1* 16.5* 12.7* 12.0*  HGB 9.2* 8.8* 8.5* 8.6* 8.9*  HCT 26.8* 25.6* 25.2* 25.7* 26.9*  MCV 94.4 94.5 95.1 96.3 97.1  PLT 693* 786* 825* 858* 890*   Cardiac Enzymes: No results for input(s): CKTOTAL, CKMB, CKMBINDEX, TROPONINI in the last 168 hours. BNP: Invalid input(s): POCBNP CBG: No results for input(s): GLUCAP in the last 168 hours. D-Dimer No results for input(s): DDIMER in the last 72 hours. Hgb A1c No results for input(s): HGBA1C in the last 72 hours. Lipid Profile No results for input(s): CHOL, HDL, LDLCALC, TRIG, CHOLHDL, LDLDIRECT in the last 72 hours. Thyroid function studies No results for input(s): TSH,  T4TOTAL, T3FREE, THYROIDAB in the last 72 hours.  Invalid input(s): FREET3 Anemia work up No results for input(s): VITAMINB12, FOLATE, FERRITIN, TIBC, IRON, RETICCTPCT in the last 72 hours. Urinalysis    Component Value Date/Time   COLORURINE YELLOW 05/24/2020 0502   APPEARANCEUR HAZY (A) 05/24/2020 0502   LABSPEC 1.016 05/24/2020 0502   PHURINE 5.0 05/24/2020 0502   GLUCOSEU 50 (A) 05/24/2020 0502   HGBUR MODERATE (A) 05/24/2020 0502   BILIRUBINUR NEGATIVE 05/24/2020 0502   KETONESUR NEGATIVE 05/24/2020 0502   PROTEINUR 100 (A) 05/24/2020 0502   NITRITE NEGATIVE 05/24/2020 0502   LEUKOCYTESUR NEGATIVE 05/24/2020 0502   Sepsis Labs Invalid input(s): PROCALCITONIN,  WBC,  LACTICIDVEN Microbiology Recent Results (from the past 240 hour(s))  Culture, blood (routine x 2)     Status: None   Collection Time: 05/31/20  8:07 AM   Specimen: BLOOD LEFT HAND  Result Value Ref Range Status   Specimen Description BLOOD LEFT HAND  Final   Special Requests   Final    BOTTLES DRAWN AEROBIC AND ANAEROBIC Blood Culture adequate volume   Culture   Final    NO GROWTH 5 DAYS Performed at [facility], [address], [place], [place] [address]    Report Status 06/05/2020 FINAL  Final  Culture, blood (routine x 2)     Status: None   Collection Time: 05/31/20  8:12 AM   Specimen: BLOOD RIGHT HAND  Result Value Ref Range Status   Specimen Description BLOOD RIGHT HAND  Final   Special Requests   Final    BOTTLES DRAWN AEROBIC ONLY Blood Culture results may not be optimal due to an inadequate volume of blood received in culture bottles   Culture   Final    NO GROWTH 5 DAYS Performed at [facility], [place] [address] [place], [place] [address]    Report Status 06/05/2020 FINAL  Final  Gram stain     Status: None   Collection Time: 05/31/20 11:52 AM   Specimen: Fluid  Result Value Ref Range Status   Specimen Description FLUID  Final   Special Requests PLEURAL FLUID LEFT  Final    Gram Stain   Final    FEW WBC PRESENT, PREDOMINANTLY PMN NO ORGANISMS SEEN Performed at [facility], [place] [address], [place], [place] [address]    Report Status 05/31/2020 FINAL  Final  Culture, body fluid w Gram Stain-bottle     Status: None   Collection Time: 05/31/20 11:52 AM   Specimen: Fluid  Result Value Ref Range Status   Specimen Description FLUID  Final   Special Requests PLEURAL FLUID LEFT  Final   Culture   Final    NO GROWTH 5 DAYS Performed at [facility] [address], [place], [place] [address]    Report Status 06/05/2020 FINAL  Final  Culture, blood (routine x 2)     Status: None   Collection Time: 06/04/20  9:54 PM   Specimen: BLOOD LEFT HAND  Result Value Ref Range Status   Specimen Description BLOOD LEFT HAND  Final   Special Requests   Final    BOTTLES DRAWN AEROBIC AND ANAEROBIC Blood Culture adequate volume   Culture   Final    NO GROWTH 5 DAYS Performed at [facility], [place] [address], [place], [place] [address]    Report Status 06/09/2020 FINAL  Final  Culture, blood (routine x 2)     Status: None   Collection Time: 06/04/20 10:07 PM   Specimen: BLOOD LEFT HAND  Result Value Ref Range Status   Specimen Description BLOOD LEFT HAND  Final   Special Requests   Final    BOTTLES DRAWN AEROBIC AND ANAEROBIC Blood Culture adequate volume   Culture   Final    NO GROWTH 5 DAYS Performed at [facility], [place] [address] [place], [place] [address]    Report Status 06/09/2020 FINAL  Final    Procedures/Studies: DG Chest 1 View  Result Date: 05/31/2020 CLINICAL DATA:  50 year old male status post left thoracentesis. Follow-up study. EXAM: CHEST  1 VIEW COMPARISON:  Chest x-ray 05/31/2020. FINDINGS: Previously noted left pleural effusion has decreased and is now very small. No appreciable left-sided pneumothorax. Trace right pleural effusion. Small bore pigtail drainage catheter in the inferior aspect of the right hemithorax. Several  areas of airspace consolidation and cavitation are again noted in the lungs bilaterally, similar to the prior study. No pneumothorax. No evidence of pulmonary edema. Heart size is normal. Upper mediastinal contours are within normal limits. IMPRESSION: 1. Decreased now small left pleural effusion following left-sided thoracentesis. No pneumothorax. Multilobar bilateral cavitary pneumonia, similar to the prior examination. Electronically Signed   By: Mauri Brooklyn.D.  On: 05/31/2020 12:24   CT HEAD WO CONTRAST  Result Date: 05/27/2020 CLINICAL DATA:  Mental status change, CNS infection suspected EXAM: CT HEAD WITHOUT CONTRAST TECHNIQUE: Contiguous axial images were obtained from the base of the skull through the vertex without intravenous contrast. COMPARISON:  None. FINDINGS: Brain: No evidence of large-territorial acute infarction. No parenchymal hemorrhage. No mass lesion. No extra-axial collection. No mass effect or midline shift. No hydrocephalus. Basilar cisterns are patent. Vascular: No hyperdense vessel. Skull: No acute fracture or focal lesion. Sinuses/Orbits: Paranasal sinuses and mastoid air cells are clear. The orbits are unremarkable. Other: None. IMPRESSION: No acute intracranial abnormality. Electronically Signed   By: Iven Finn M.D.   On: 05/27/2020 19:45   CT CHEST WO CONTRAST  Result Date: 05/31/2020 CLINICAL DATA:  Follow-up multi lobar bilateral cavitary pneumonia and pleural fluid. EXAM: CT CHEST WITHOUT CONTRAST TECHNIQUE: Multidetector CT imaging of the chest was performed following the standard protocol without IV contrast. COMPARISON:  Chest radiograph obtained earlier today following left thoracentesis. Chest CT obtained yesterday. FINDINGS: Cardiovascular: No significant vascular findings. Normal heart size. No pericardial effusion. Mediastinum/Nodes: The previously demonstrated 10 mm short axis precarinal node measures 9 mm in short axis diameter today. No new enlarged  lymph nodes. Unremarkable thyroid gland and esophagus. Lungs/Pleura: Interval right basilar pleural pigtail catheter with a marked decrease in right pleural fluid. Only a small amount of residual pleural fluid remains at the posterior right lung base. Small to moderate-sized left pleural effusion, significantly decreased following thoracentesis. No pneumothorax. Large number of cavitary lesions are again demonstrated throughout both lungs with decreased soft tissue and patchy components. Upper Abdomen: Unremarkable. Musculoskeletal: Mild thoracic and lower cervical spine degenerative changes. IMPRESSION: 1. Interval right basilar pleural pigtail catheter with a marked decrease in right pleural fluid. Only a small amount of residual pleural fluid remains at the posterior right lung base. 2. Small to moderate-sized left pleural effusion, significantly decreased following thoracentesis. 3. Large number of cavitary lesions throughout both lungs with decreased soft tissue and patchy components, compatible with improving septic emboli. 4. Stable mild reactive mediastinal adenopathy. Electronically Signed   By: Claudie Revering M.D.   On: 05/31/2020 23:08   CT CHEST WO CONTRAST  Result Date: 05/30/2020 CLINICAL DATA:  Respiratory failure. Staph aureus bacteremia. EXAM: CT CHEST WITHOUT CONTRAST TECHNIQUE: Multidetector CT imaging of the chest was performed following the standard protocol without IV contrast. COMPARISON:  May 24, 2020 FINDINGS: Cardiovascular: Mildly enlarged cardiac silhouette. No sizable pericardial effusion. Mediastinum/Nodes: Borderline enlarged precarinal lymph node measures 10 mm in short axis, likely reactive. Other shotty mediastinal lymph nodes are present. Trachea and main bronchi are patent. The esophagus is normal. Lungs/Pleura: Interval worsening of the aeration of the lungs with numerous predominantly peripheral areas of nodular airspace consolidation, some confluent in the right upper lobe,  lingula and bilateral lower lobes. There has been interval development of cavitations within multiple of these areas of airspace consolidation. There are moderate in size bilateral pleural effusions. Overall the aeration of the lungs has worsened. Upper Abdomen: No acute abnormality. Musculoskeletal: No chest wall mass or suspicious bone lesions identified. IMPRESSION: 1. Interval worsening of the aeration of the lungs with numerous predominantly peripheral areas of nodular and confluent airspace consolidation. Interval development of cavitations within multiple of these areas of airspace consolidation. These findings are most consistent with worsening multifocal pneumonia and numerous septic emboli. 2. Moderate in size bilateral pleural effusions. 3. Borderline enlarged precarinal lymph node, likely reactive. 4. Mild  cardiomegaly. Electronically Signed   By: Fidela Salisbury M.D.   On: 05/30/2020 12:28   CT Chest Wo Contrast  Result Date: 05/24/2020 CLINICAL DATA:  Chest pain and shortness of breath. EXAM: CT CHEST WITHOUT CONTRAST TECHNIQUE: Multidetector CT imaging of the chest was performed following the standard protocol without IV contrast. COMPARISON:  None. FINDINGS: Cardiovascular: No significant vascular findings. Normal heart size. No pericardial effusion. Mediastinum/Nodes: No enlarged mediastinal or axillary lymph nodes. Thyroid gland, trachea, and esophagus demonstrate no significant findings. Lungs/Pleura: Innumerable ill-defined bilateral noncalcified nodular appearing areas are seen throughout both lungs. Marked severity, patchy anteromedial left upper lobe and bilateral lower lobe infiltrates are present. A very small left pleural effusion is seen. No pneumothorax is identified. Upper Abdomen: No acute abnormality. Musculoskeletal: No chest wall mass or suspicious bone lesions identified. IMPRESSION: 1. Marked severity left upper lobe and bilateral lower lobe infiltrates with innumerable  bilateral nodular appearing areas, as described above. While this may be, in part, infectious in etiology, sequelae associated with pulmonary metastasis cannot be excluded. 2. Very small left pleural effusion. Electronically Signed   By: Virgina Norfolk M.D.   On: 05/24/2020 18:19   US RENAL  Result Date: 05/26/2020 CLINICAL DATA:  Acute kidney injury EXAM: RENAL / URINARY TRACT ULTRASOUND COMPLETE COMPARISON:  None. FINDINGS: Right Kidney: Renal measurements: 13.1 x 5.1 x 4.7 cm = volume: 166 mL. Echogenicity within normal limits. No mass or hydronephrosis visualized. Left Kidney: Renal measurements: 12.6 x 6.0 x 4.0 cm = volume: 161 mL. Mild hydronephrosis. Normal echotexture. No mass. Bladder: Appears normal for degree of bladder distention. Other: None. IMPRESSION: Mild left hydronephrosis. Electronically Signed   By: Rolm Baptise M.D.   On: 05/26/2020 20:30   DG CHEST PORT 1 VIEW  Result Date: 06/08/2020 CLINICAL DATA:  Shortness of breath, chest pain. EXAM: PORTABLE CHEST 1 VIEW COMPARISON:  June 05, 2020. FINDINGS: Mild improvement in left basilar opacities. Otherwise, similar patchy multifocal opacities. Similar numerous cystic lucencies, compatible with cavitary lesion seen on prior CT chest. No visible pneumothorax on this semi erect radiograph. Suspected small bilateral pleural effusions, unchanged. Similar cardiac silhouette, within normal limits. Similar positioning of a right upper extremity PICC. IMPRESSION: 1. Slight improvement in left basilar opacities. Otherwise, similar multifocal opacities and cystic lucencies. 2. Similar suspected small bilateral pleural effusions Electronically Signed   By: Margaretha Sheffield MD   On: 06/08/2020 09:10   DG CHEST PORT 1 VIEW  Result Date: 06/05/2020 CLINICAL DATA:  Pleural effusion, dyspnea EXAM: PORTABLE CHEST 1 VIEW COMPARISON:  06/03/2020 FINDINGS: Bilateral asymmetric pulmonary infiltrate, more focal within the right mid lung zone peripherally  and within the left lung base appears slightly progressive within the right lung. Numerous cystic lucencies are again identified in keeping with the multiple cavitary lesions noted on prior CT examination of 05/31/2020. No pneumothorax. Tiny bilateral pleural effusions are present. Cardiac size within normal limits. Right upper extremity PICC line has been placed with its tip within the superior right atrium. Pulmonary vascularity is normal. No acute bone abnormality. IMPRESSION: Right upper extremity PICC line tip within the superior right atrium. Progressive multifocal pulmonary infiltrates, Electronically Signed   By: Fidela Salisbury MD   On: 06/05/2020 07:26   DG CHEST PORT 1 VIEW  Result Date: 06/03/2020 CLINICAL DATA:  Left pleural effusion EXAM: PORTABLE CHEST 1 VIEW COMPARISON:  06/02/2020 FINDINGS: bilateral airspace disease and cavitary lesions again noted, left greater than right, unchanged since prior study. Small bilateral pleural effusions.  No pneumothorax. Heart is normal size. IMPRESSION: Small bilateral pleural effusions with bilateral cavitary airspace disease, not significantly changed. Electronically Signed   By: Rolm Baptise M.D.   On: 06/03/2020 08:06   DG Chest Port 1 View  Result Date: 06/02/2020 CLINICAL DATA:  Shortness of breath. EXAM: PORTABLE CHEST 1 VIEW COMPARISON:  Radiograph of same day.  CT of May 31, 2020. FINDINGS: The heart size and mediastinal contours are within normal limits. No definite pneumothorax is noted. Stable bilateral lung opacities are noted, left greater than right with associated pleural effusions most consistent with pneumonia. The visualized skeletal structures are unremarkable. IMPRESSION: Stable bilateral lung opacities are noted most consistent with pneumonia with small associated pleural effusions. Electronically Signed   By: Marijo Conception M.D.   On: 06/02/2020 13:57   DG CHEST PORT 1 VIEW  Result Date: 06/02/2020 CLINICAL DATA:  Shortness of  breath, chest tube EXAM: PORTABLE CHEST 1 VIEW COMPARISON:  05/31/2020 FINDINGS: Right basilar chest tube remains in place, unchanged. No pneumothorax. Patchy bilateral airspace disease, worsening on the left since prior study. Heart is normal size. Small bilateral effusions. No acute bony abnormality. IMPRESSION: No pneumothorax. Patchy bilateral airspace disease, worsening on the left. Small effusions. Electronically Signed   By: Rolm Baptise M.D.   On: 06/02/2020 08:10   DG CHEST PORT 1 VIEW  Result Date: 05/31/2020 CLINICAL DATA:  Chest tube in place EXAM: PORTABLE CHEST 1 VIEW COMPARISON:  May 30, 2020 chest radiograph and chest CT FINDINGS: Pigtail catheter noted at right base, unchanged. No pneumothorax. Multiple opacities throughout the lungs, some of which are cavitated, persist without change. There is consolidation in the left base with small left pleural effusion. Partial clearing of opacity from right base compared to 1 day prior. Heart is mildly enlarged with pulmonary vascularity within normal limits. No adenopathy. No bone lesions. IMPRESSION: Pigtail catheter at right base. No pneumothorax. Multiple cavitary lesions, likely septic emboli, persist. Consolidation left lower lung region with small left pleural effusion. There has been partial clearing of opacity from the right base compared to 1 day prior. Stable cardiac silhouette. Electronically Signed   By: Lowella Grip III M.D.   On: 05/31/2020 08:07   DG CHEST PORT 1 VIEW  Result Date: 05/30/2020 CLINICAL DATA:  Shortness of breath. EXAM: PORTABLE CHEST 1 VIEW COMPARISON:  05/28/2020 FINDINGS: Diffuse patchy and nodular bilateral airspace disease is stable. Some of the nodules are cavitated. The cardiopericardial silhouette is within normal limits for size. Pleural drain noted over the right lower hemithorax. Telemetry leads overlie the chest. IMPRESSION: No substantial interval change diffuse bilateral patchy and nodular airspace  disease bilaterally. Electronically Signed   By: Misty Stanley M.D.   On: 05/30/2020 15:29   DG CHEST PORT 1 VIEW  Result Date: 05/28/2020 CLINICAL DATA:  Short of breath, hypoxia EXAM: PORTABLE CHEST 1 VIEW COMPARISON:  Prior chest x-ray 05/26/2020; CT chest 05/24/2020 FINDINGS: Extensive bilateral nodular opacities and areas of geographic pulmonary airspace opacity. Probable small bilateral pleural effusions. Stable cardiac and mediastinal contours. No evidence of pneumothorax. No significant interval change compared to 05/26/2020. No acute osseous abnormality. IMPRESSION: No significant interval change in the appearance of the chest compared to 05/26/2020. Persistent diffuse bilateral nodular and geographic areas of airspace infiltrate. Imaging findings most suggestive of septic emboli. Small bilateral pleural effusions. Electronically Signed   By: Jacqulynn Cadet M.D.   On: 05/28/2020 11:13   DG CHEST PORT 1 VIEW  Result Date: 05/26/2020  CLINICAL DATA:  Shortness of breath EXAM: PORTABLE CHEST 1 VIEW COMPARISON:  May 24, 2020 chest radiograph and chest CT FINDINGS: Multiple nodular opacities are seen throughout the lungs, appreciable on recent CT but not appreciable on chest radiograph from 2 days prior. No areas of cavitation evident. Airspace consolidation is noted in portions of each lung base with equivocal pleural effusions bilaterally. Heart size and pulmonary vascular normal. No adenopathy appreciable by radiography. No bone lesions. IMPRESSION: Airspace opacity in the lung bases consistent with pneumonia. Small pleural effusions bilaterally. Nodular opacities throughout the lungs with increased conspicuity compared to chest radiograph 2 days prior. Question multiple septic emboli, although no cavitation evident. Underlying neoplasm is also possible. Heart size normal.  No adenopathy appreciable by radiography. Electronically Signed   By: Lowella Grip III M.D.   On: 05/26/2020 09:49   DG  Chest Port 1 View  Result Date: 05/24/2020 CLINICAL DATA:  Sepsis.  LEFT arm plain EXAM: PORTABLE CHEST 1 VIEW COMPARISON:  06/12/2007 FINDINGS: Normal cardiac silhouette. There is bibasilar airspace disease in perihilar distribution. Nodule in the LEFT upper lobe. Small effusions. No pneumothorax. IMPRESSION: Findings most consistent with multilobar pneumonia. LEFT upper lobe nodularity. Followup PA and lateral chest X-ray is recommended in 3-4 weeks following trial of antibiotic therapy to ensure resolution and exclude underlying malignancy. Electronically Signed   By: Suzy Bouchard M.D.   On: 05/24/2020 15:53   ECHOCARDIOGRAM COMPLETE  Result Date: 05/25/2020    ECHOCARDIOGRAM REPORT   Patient Name:   Edward Mueller Date of Exam: 05/25/2020 Medical Rec #:  [id number]      Height:       75.0 in Accession #:    [id number]     Weight:       150.0 lb Date of Birth:  1971-01-17      BSA:          1.942 m Patient Age:    62 years       BP:           117/82 mmHg Patient Gender: M              HR:           99 bpm. Exam Location:  Inpatient Procedure: 2D Echo, Cardiac Doppler, Color Doppler and Intracardiac            Opacification Agent Indications:    Endocarditis  History:        Patient has no prior history of Echocardiogram examinations.  Sonographer:    Luisa Hart RDCS Referring Phys: [id number] RAVI [place]  1. Abnormal septal motion . Left ventricular ejection fraction, by estimation, is 45 to 50%. The left ventricle has mildly decreased function. The left ventricle has no regional wall motion abnormalities. Left ventricular diastolic parameters were normal.  2. Right ventricular systolic function is normal. The right ventricular size is normal.  3. The mitral valve is normal in structure. No evidence of mitral valve regurgitation. No evidence of mitral stenosis.  4. The aortic valve is tricuspid. Aortic valve regurgitation is not visualized. No aortic stenosis is present.  5. The inferior vena  cava is normal in size with greater than 50% respiratory variability, suggesting right atrial pressure of 3 mmHg. FINDINGS  Left Ventricle: Abnormal septal motion. Left ventricular ejection fraction, by estimation, is 45 to 50%. The left ventricle has mildly decreased function. The left ventricle has no regional wall motion abnormalities. Definity contrast agent was given IV  to delineate  the left ventricular endocardial borders. The left ventricular internal cavity size was normal in size. There is no left ventricular hypertrophy. Left ventricular diastolic parameters were normal. Right Ventricle: The right ventricular size is normal. No increase in right ventricular wall thickness. Right ventricular systolic function is normal. Left Atrium: Left atrial size was normal in size. Right Atrium: Right atrial size was normal in size. Pericardium: There is no evidence of pericardial effusion. Mitral Valve: The mitral valve is normal in structure. There is mild thickening of the mitral valve leaflet(s). There is mild calcification of the mitral valve leaflet(s). No evidence of mitral valve regurgitation. No evidence of mitral valve stenosis. Tricuspid Valve: The tricuspid valve is normal in structure. Tricuspid valve regurgitation is not demonstrated. No evidence of tricuspid stenosis. Aortic Valve: The aortic valve is tricuspid. Aortic valve regurgitation is not visualized. No aortic stenosis is present. Aortic valve mean gradient measures 4.5 mmHg. Aortic valve peak gradient measures 7.3 mmHg. Aortic valve area, by VTI measures 2.67 cm. Pulmonic Valve: The pulmonic valve was normal in structure. Pulmonic valve regurgitation is not visualized. No evidence of pulmonic stenosis. Aorta: The aortic root is normal in size and structure. Venous: The inferior vena cava is normal in size with greater than 50% respiratory variability, suggesting right atrial pressure of 3 mmHg. IAS/Shunts: No atrial level shunt detected by color  flow Doppler.  LEFT VENTRICLE PLAX 2D LVIDd:         5.10 cm      Diastology LVIDs:         4.20 cm      LV e' medial:    7.72 cm/s LV PW:         0.70 cm      LV E/e' medial:  10.0 LV IVS:        0.60 cm      LV e' lateral:   16.20 cm/s LVOT diam:     2.30 cm      LV E/e' lateral: 4.8 LV SV:         55 LV SV Index:   28 LVOT Area:     4.15 cm  LV Volumes (MOD) LV vol d, MOD A2C: 61.7 ml LV vol d, MOD A4C: 103.0 ml LV vol s, MOD A4C: 69.0 ml LV SV MOD A4C:     103.0 ml RIGHT VENTRICLE RV S prime:     13.40 cm/s  PULMONARY VEINS TAPSE (M-mode): 2.4 cm      A Reversal Duration: 90.00 msec                             A Reversal Velocity: 29.80 cm/s                             Diastolic Velocity:  16.10 cm/s                             S/D Velocity:        0.80                             Systolic Velocity:   96.04 cm/s LEFT ATRIUM             Index       RIGHT ATRIUM  Index LA diam:        3.30 cm 1.70 cm/m  RA Area:     14.10 cm LA Vol (A2C):   45.8 ml 23.59 ml/m RA Volume:   37.50 ml  19.31 ml/m LA Vol (A4C):   57.1 ml 29.40 ml/m LA Biplane Vol: 53.0 ml 27.29 ml/m  AORTIC VALVE                    PULMONIC VALVE AV Area (Vmax):    2.60 cm     PV Vmax:       0.94 m/s AV Area (Vmean):   2.33 cm     PV Vmean:      78.000 cm/s AV Area (VTI):     2.67 cm     PV VTI:        0.157 m AV Vmax:           135.00 cm/s  PV Peak grad:  3.5 mmHg AV Vmean:          101.100 cm/s PV Mean grad:  3.0 mmHg AV VTI:            0.207 m AV Peak Grad:      7.3 mmHg AV Mean Grad:      4.5 mmHg LVOT Vmax:         84.50 cm/s LVOT Vmean:        56.700 cm/s LVOT VTI:          0.133 m LVOT/AV VTI ratio: 0.64  AORTA Ao Root diam: 3.20 cm Ao Asc diam:  2.80 cm MITRAL VALVE MV Area (PHT): 6.83 cm    SHUNTS MV Decel Time: 111 msec    Systemic VTI:  0.13 m MV E velocity: 77.10 cm/s  Systemic Diam: 2.30 cm MV A velocity: 81.20 cm/s MV E/A ratio:  0.95 Jenkins Rouge MD Electronically signed by Jenkins Rouge MD Signature Date/Time:  05/25/2020/9:09:26 AM    Final    ECHO TEE  Result Date: 05/27/2020    TRANSESOPHOGEAL ECHO REPORT   Patient Name:   Edward Mueller Date of Exam: 05/27/2020 Medical Rec #:  [id number]      Height:       75.0 in Accession #:    [id number]     Weight:       150.0 lb Date of Birth:  Mar 02, 1970      BSA:          1.942 m Patient Age:    71 years       BP:           106/59 mmHg Patient Gender: M              HR:           104 bpm. Exam Location:  Inpatient Procedure: Transesophageal Echo, Color Doppler and Cardiac Doppler Indications:     Bacteremia R78.81  History:         Patient has prior history of Echocardiogram examinations, most                  recent 05/25/2020.  Sonographer:     Dustin Flock Referring Phys:  [id number] Barb Merino Diagnosing Phys: Sanda Klein MD PROCEDURE: After discussion of the risks and benefits of a TEE, an informed consent was obtained. The transesophogeal probe was passed without difficulty through the esophogus of the patient. Sedation performed by different physician. The patient was monitored while under deep sedation. Anesthestetic sedation was provided  intravenously by Anesthesiology: 234.12m of Propofol. The patient's vital signs; including heart rate, blood pressure, and oxygen saturation; remained stable throughout the procedure. The patient developed no complications during the procedure. IMPRESSIONS  1. Left ventricular ejection fraction, by estimation, is 60 to 65%. The left ventricle has normal function. The left ventricle has no regional wall motion abnormalities.  2. Right ventricular systolic function is normal. The right ventricular size is normal.  3. No left atrial/left atrial appendage thrombus was detected.  4. The mitral valve is normal in structure. Trivial mitral valve regurgitation. No evidence of mitral stenosis.  5. The aortic valve is normal in structure. Aortic valve regurgitation is not visualized. No aortic stenosis is present.  6. The inferior vena cava  is normal in size with greater than 50% respiratory variability, suggesting right atrial pressure of 3 mmHg. Conclusion(s)/Recommendation(s): Normal biventricular function without evidence of hemodynamically significant valvular heart disease. No evidence of vegetation/infective endocarditis on this transesophageal echocardiogram. FINDINGS  Left Ventricle: Left ventricular ejection fraction, by estimation, is 60 to 65%. The left ventricle has normal function. The left ventricle has no regional wall motion abnormalities. The left ventricular internal cavity size was normal in size. There is  no left ventricular hypertrophy. Right Ventricle: The right ventricular size is normal. No increase in right ventricular wall thickness. Right ventricular systolic function is normal. Left Atrium: Left atrial size was normal in size. No left atrial/left atrial appendage thrombus was detected. Right Atrium: Right atrial size was normal in size. Pericardium: There is no evidence of pericardial effusion. Mitral Valve: The mitral valve is normal in structure. Trivial mitral valve regurgitation. No evidence of mitral valve stenosis. Tricuspid Valve: The tricuspid valve is normal in structure. Tricuspid valve regurgitation is not demonstrated. No evidence of tricuspid stenosis. Aortic Valve: The aortic valve is normal in structure. Aortic valve regurgitation is not visualized. No aortic stenosis is present. Pulmonic Valve: The pulmonic valve was normal in structure. Pulmonic valve regurgitation is not visualized. No evidence of pulmonic stenosis. Aorta: The aortic root is normal in size and structure. Venous: The inferior vena cava is normal in size with greater than 50% respiratory variability, suggesting right atrial pressure of 3 mmHg. IAS/Shunts: No atrial level shunt detected by color flow Doppler. MSanda KleinMD Electronically signed by MSanda KleinMD Signature Date/Time: 05/27/2020/5:10:51 PM    Final    VAS [place]  EXTREMITY VENOUS DUPLEX  Result Date: 05/25/2020 UPPER VENOUS STUDY  Indications: Pain, swelling, redness (linear pattern) Risk Factors: Donates plasma regularly. Comparison Study: No previous exams Performing Technologist: JRogelia Rohrer Examination Guidelines: A complete evaluation includes B-mode imaging, spectral Doppler, color Doppler, and power Doppler as needed of all accessible portions of each vessel. Bilateral testing is considered an integral part of a complete examination. Limited examinations for reoccurring indications may be performed as noted.  Right Findings: +----------+------------+---------+-----------+----------+-------+ RIGHT     CompressiblePhasicitySpontaneousPropertiesSummary +----------+------------+---------+-----------+----------+-------+ Subclavian    Full       Yes       Yes                      +----------+------------+---------+-----------+----------+-------+  Left Findings: +----------+------------+---------+-----------+----------+-------+ LEFT      CompressiblePhasicitySpontaneousPropertiesSummary +----------+------------+---------+-----------+----------+-------+ IJV           Full       Yes       Yes                      +----------+------------+---------+-----------+----------+-------+  Subclavian    Full       Yes       Yes                      +----------+------------+---------+-----------+----------+-------+ Axillary      Full       Yes       Yes                      +----------+------------+---------+-----------+----------+-------+ Brachial      Full       Yes       Yes                      +----------+------------+---------+-----------+----------+-------+ Radial        Full                                          +----------+------------+---------+-----------+----------+-------+ Ulnar         Full                                          +----------+------------+---------+-----------+----------+-------+ Cephalic       None       No        No                Acute  +----------+------------+---------+-----------+----------+-------+ Basilic       Full       Yes       Yes                      +----------+------------+---------+-----------+----------+-------+  Summary:  Right: No evidence of thrombosis in the subclavian.  Left: No evidence of deep vein thrombosis in the upper extremity. Findings consistent with acute superficial vein thrombosis involving the left cephalic vein.  *See table(s) above for measurements and observations.  Diagnosing physician: Ruta Hinds MD Electronically signed by Ruta Hinds MD on 05/25/2020 at 3:00:55 PM.    Final    [place] EKG SITE RITE  Result Date: 06/03/2020 If Site Rite image not attached, placement could not be confirmed due to current cardiac rhythm.  US THORACENTESIS ASP PLEURAL SPACE W/IMG GUIDE  Result Date: 05/31/2020 INDICATION: Patient with history of sepsis, MSSA bacteremia, acute hypoxic respiratory failure, bilateral pneumonia, and bilateral pleural effusions s/p right chest tube placement in IR 05/30/2020. Request is made for diagnostic and therapeutic left thoracentesis. EXAM: ULTRASOUND GUIDED DIAGNOSTIC AND THERAPEUTIC LEFT THORACENTESIS MEDICATIONS: 10 mL 1% lidocaine COMPLICATIONS: None immediate. PROCEDURE: An ultrasound guided thoracentesis was thoroughly discussed with the patient and questions answered. The benefits, risks, alternatives and complications were also discussed. The patient understands and wishes to proceed with the procedure. Written consent was obtained. Ultrasound was performed to localize and mark an adequate pocket of fluid in the left chest. The area was then prepped and draped in the normal sterile fashion. 1% Lidocaine was used for local anesthesia. Under ultrasound guidance a 6 Fr Safe-T-Centesis catheter was introduced. Thoracentesis was performed. The catheter was removed and a dressing applied. FINDINGS: A total of approximately  550 mL of dark red fluid was removed. Samples were sent to the laboratory as requested by the clinical team. IMPRESSION: Successful ultrasound guided left thoracentesis yielding 550 mL of pleural fluid.  Read by: Earley Abide, PA-C Electronically Signed   By: Ruthann Cancer MD   On: 05/31/2020 12:30   US THORACENTESIS ASP PLEURAL SPACE W/IMG GUIDE  Result Date: 05/30/2020 INDICATION: 50 year old male with bacteremia, pulmonary septic emboli, and bilateral pleural effusions, right greater than left. EXAM: 1. Ultrasound-guided puncture of right pleural space. 2. Placement of right thoracostomy tube. MEDICATIONS: The patient is currently admitted to the hospital and receiving intravenous antibiotics. The antibiotics were administered within an appropriate time frame prior to the initiation of the procedure. ANESTHESIA/SEDATION: Local anesthesia only. COMPLICATIONS: None immediate. PROCEDURE: Informed written consent was obtained from the patient after a thorough discussion of the procedural risks, benefits and alternatives. All questions were addressed. Maximal Sterile Barrier Technique was utilized including caps, mask, sterile gowns, sterile gloves, sterile drape, hand hygiene and skin antiseptic. A timeout was performed prior to the initiation of the procedure. Preprocedure ultrasound evaluation demonstrated mildly complex fluid collection within the right pleural space of small to moderate volume. Given complexity of fluid, a pigtail thoracostomy tube was requested by the ordering provider. The procedure was planned. The right lower back was prepped and draped in standard fashion. Subdermal Local anesthesia was provided at the planned needle entry site with 1% lidocaine. Deeper local anesthetic was administered under ultrasound guidance along the pleura at the planned needle entry site. A small skin nick was made. Under direct ultrasound visualization, a 7 cm, 5 [place] Yueh needle was directed into the right  pleural space. A stiff Amplatz wire was inserted through the Yueh catheter and serial dilation was performed prior to placement of 10.2 French pigtail thoracostomy tube. The pigtail portion was formed. Ultrasound evaluation demonstrated position of the catheter within the pleural space. There was immediate E flux of translucent, serosanguineous fluid. The catheter was connected to a pleura vac and set to wall suction. There was immediate evacuation of approximately 300 mL of fluid. The drainage catheter was sutured at the skin entry site and a sterile bandage was applied with nonocclusive gauze around the skin entry site. The patient tolerated the procedure well was transferred back to the floor in stable condition. IMPRESSION: Technically successful right basilar pigtail thoracostomy tube (10.2 Fr) placement. Ruthann Cancer, MD [facility] Radiology Electronically Signed   By: Ruthann Cancer MD   On: 05/30/2020 16:06     Time coordinating discharge: Over 30 minutes    Dwyane Dee, MD  [facility] 06/09/2020, 3:47 PM

## 2020-06-09 NOTE — Progress Notes (Signed)
Physical Therapy Treatment Patient Details Name: Edward Mueller: [id number] DOB: 26-Jun-1970 Today's Date: 06/09/2020    History of Present Illness Pt is a 50 y.o. 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 Chest CT shows multiple peripheral infiltrates consistent with embolic phenomena; concern for endocarditis. Workup for compensated septic shock due to LUE cellulitis, MSSA bactermia of unclear source. S/p TEE 4/6 with no evidence of endocarditis. UDS (+) THC. Pt with loculated R pleural effusion s/p R chest tube placement 4/9. S/p L thoracentesis 4/10. S/p chest tube removal 4/12. No reported PMH; pt reports donating plasma 2x/wk.   PT Comments    Pt progressing well with mobility; preparing for d/c today. Today's session focused on stair training and gait training without DME.Pt's mobility significantly improved during admission; still with bouts of DOE, although improving. Reviewed educ; all questions and concerns addressed.    Follow Up Recommendations  Home health PT     Equipment Recommendations  Rolling walker with 5" wheels    Recommendations for Other Services       Precautions / Restrictions Precautions Precautions: Fall;Other (comment) Precaution Comments: SOB with minimal exertion Restrictions Weight Bearing Restrictions: No    Mobility  Bed Mobility Overal bed mobility: Modified Independent Bed Mobility: Supine to Sit           General bed mobility comments: Mod indep, not requiring increased time/effort; still with some SOB with effort    Transfers Overall transfer level: Needs assistance Equipment used: None Transfers: Sit to/from Stand Sit to Stand: Supervision         General transfer comment: Multiple sit<>stands from EOB and transport chair, supervision for safety  Ambulation/Gait Ambulation/Gait assistance:  Supervision Gait Distance (Feet): 60 Feet Assistive device: None Gait Pattern/deviations: Step-through pattern;Decreased stride length;Trunk flexed Gait velocity: Decreased   General Gait Details: Slow, guarded gait without DME, supervision for safety; noted improvements in DOE but still with periods of pt taking significant breaths/min   Stairs Stairs: Yes Stairs assistance: Supervision Stair Management: One rail Right;Step to pattern;Forwards Number of Stairs: 4 General stair comments: Ascend/descended 4 steps with single UE rail support, supervision for safety   Wheelchair Mobility    Modified Rankin (Stroke Patients Only)       Balance Overall balance assessment: Needs assistance Sitting-balance support: No upper extremity supported;Feet supported Sitting balance-Leahy Scale: Good     Standing balance support: No upper extremity supported Standing balance-Leahy Scale: Fair Standing balance comment: Can ambulate without UE support, guarded                            Cognition Arousal/Alertness: Awake/alert Behavior During Therapy: WFL for tasks assessed/performed;Flat affect Overall Cognitive Status: Within Functional Limits for tasks assessed                                        Exercises      General Comments General comments (skin integrity, edema, etc.): Pt's mother present and supportive. Reviewed educ with pt re: activity recommendations, pursed lip breathing, activity pacing, importance of mobility      Pertinent Vitals/Pain Pain Assessment: No/denies pain Pain Intervention(s): Monitored during session    Home Living  Prior Function            PT Goals (current goals can now be found in the care plan section) Progress towards PT goals: Progressing toward goals    Frequency    Min 3X/week      PT Plan Current plan remains appropriate    Co-evaluation              AM-PAC  PT "6 Clicks" Mobility   Outcome Measure  Help needed turning from your back to your side while in a flat bed without using bedrails?: None Help needed moving from lying on your back to sitting on the side of a flat bed without using bedrails?: None Help needed moving to and from a bed to a chair (including a wheelchair)?: A Little Help needed standing up from a chair using your arms (e.g., wheelchair or bedside chair)?: A Little Help needed to walk in hospital room?: A Little Help needed climbing 3-5 steps with a railing? : A Little 6 Click Score: 20    End of Session   Activity Tolerance: Patient tolerated treatment well Patient left: in bed;with call bell/phone within reach;with family/visitor present;with nursing/sitter in room Nurse Communication: Mobility status PT Visit Diagnosis: Other abnormalities of gait and mobility (R26.89);Other (comment)     Time: 4034-7425 PT Time Calculation (min) (ACUTE ONLY): 16 min  Charges:  $Gait Training: 8-22 mins                     Ina Homes, PT, DPT Acute Rehabilitation Services  Pager [phone] Office [phone]  Malachy Chamber 06/09/2020, 4:07 PM

## 2020-06-09 NOTE — Progress Notes (Signed)
Progress Note    Edward Mueller   WUJ:[id number]  DOB: [birth year]  DOA: 05/24/2020     16  PCP: Pcp, No  CC: "feeling sick"  Hospital Course: 50 year old gentleman with no reported medical problems who donates plasma 2 times a week, donated plasma on 05/22/20 and after about 24 hours he started not feeling well.  Reported symptoms included generalized malaise, nausea, vomiting, shortness of breath, pleuritic chest pain, cough with blood-tinged sputum. Came to the emergency room on 05/24/20, was febrile, tachypneic with lactic acidosis.  Found to have multilobular pneumonia on chest x-ray, left upper extremity cellulitis and left acute superficial vein thrombosis involving the left cephalic vein on 9/5/62134/05/2020.  Was started on IV antibiotics and IV fluids and admitted to ICU for closer monitoring then later transferred to progressive care unit.  Work-up revealed MSSA bacteremia for which infectious disease was consulted.  No vegetation was seen on TEE on 05/27/2020.  Sputum culture from 05/29/2020 growing abundant gram-negative rods, abundant gram-positive cocci with few yeast, culture was reincubated for better growth.  He has been on cefazolin for MSSA bacteremia as recommended by infectious disease.  Repeated CT chest without contrast done on 05/30/2020 showed interval worsening of the aeration of the lungs.  IR was consulted for thoracentesis and chest tube placement due to loculated pleural effusion.   Post right thoracentesis by IR on 05/30/2020 with approximately 300 cc translucent, serosanguineous fluid removed and right chest tube placement.  Post left thoracentesis by IR on 05/31/2020 with 550 cc dark red fluid removed.  Chest tube is managed by PCCM.  Right-sided chest tube was removed on 06/02/2020 with no reaccumulation of fluid.   Interval History:  No events overnight. Resting in bed this am. Still working well with PT and able to ambulate safely.  Issue remains patient concerned about safely  administering antibiotic at home without support of his sister. Not sure of his disposition plan yet, still being worked on.  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 No evidence of valvular vegetation on TEE. Appreciate infectious disease and PCCM assistance. He is currently on IV cefazolin, continue as recommended by infectious disease.   - Plan is for 6 weeks treatment, end date 07/06/2020 - recurrent fevers, BCx were repeated on 06/04/20: continue following - BCx from 4/4 and 4/10 are negative - continue trending WBC and fever curve stable and expected given cavitary lesions (will take weeks to resolve) -PICC line placed 06/04/2020 - continue IS - repeat CXR on 4/18 is stable; no reaccumulation.   Acute hypoxic respiratory failure 2/2 PNA Septic pulmonary emboli with cavitating lesions  Loculated right parapneumonic effusion, simple left parapneumonic effusion On IV antibiotics as stated above. Post left and right thoracentesis and right chest tube placement by IR. -Right chest tube removed on 06/02/2020.  No significant reaccumulation on CXR obtained on 06/03/2020 and 06/05/20 - s/p right chest tube removed 06/02/20 Continue to maintain O2 saturation greater than 90%. Now on RA - continue IS  Severe protein calorie malnutrition due to severe critical illness Albumin 1.4 on 05/31/2020 BMI 19 Dietitian consulted for assessment of nutrition requirements Continue to encourage increase in oral protein calorie intake.  Resolved post repletion.  Hypocalcemia  Resolved Non anion gap metabolic acidosis  AKI on CKD 3B, improving. Presented with creatinine  of 2.7 Creatinine downtrending Continue to monitor urine  output  Hypovolemic hyponatremia Encourage increase in oral intake - stable   Repeat BMP as needed  Hypokalemia Repleted Repeat BMP as needed  Hypophosphatemia - repleted   Resolved left upper extremity cellulitis Initially presented with left upper extremity cellulitis which has been treated and has now resolved  Left upper extremity acute superficial vein thrombosis Continue to monitor  THC/tobacco use UDS positive for THC on 05/24/2020 Denies use of cocaine or intravenous drug abuse Cessation counseling when hemodynamically stable.  Generalized weakness in the setting of severe, critical illness Out of bed to chair with every shift as tolerated Ambulate patient with every shift as tolerated Increase oral protein calorie intake as tolerated PT assessed with no further recommendations. Continue fall precautions.  Old records reviewed in assessment of this patient  Antimicrobials: Ancef 05/25/20>>current   DVT prophylaxis: heparin injection 5,000 Units Start: 05/24/20 2330 SCDs Start: 05/24/20 2315   Code Status:   Code Status: Full Code Family Communication:   Disposition Plan: Status is: Inpatient  Remains inpatient appropriate because:IV treatments appropriate due to intensity of illness or inability to take PO and Inpatient level of care appropriate due to severity of illness  Dispo:  Patient From: Home  Planned Disposition: To be determined. Patient says will no longer have sister for support at home and he is concerned on administering antibiotics for the remaining course  Medically stable for discharge: No     Risk of unplanned readmission score: Unplanned Admission- Pilot do not use: 12.26   Objective: Blood pressure 115/73, pulse 90, temperature 99.8 F (37.7 C), temperature source Oral, resp. rate 16, height 6\' 3"  (1.905 m), weight 61.1 kg, SpO2 97 %.  Examination: General appearance: alert, cooperative and no distress Head: Normocephalic,  without obvious abnormality, atraumatic Eyes: EOMI Back: No further tenderness over prior chest tube insertion site Lungs: improving but still diminished right sided breath sounds, no wheezing Heart: regular rate and rhythm and S1, S2 normal Abdomen: normal findings: bowel sounds normal and soft, non-tender Extremities: no edema Skin: mobility and turgor normal Neurologic: Grossly normal  Consultants:   ID  Radiology  - consult PRN  Pulm - signed off  Procedures:   2D echo on 05/25/2020  TEE on 05/27/2020.  Chest tube placement, right, 05/30/20  Right chest tube removal on 06/02/2020.  Data Reviewed: I have personally reviewed following labs and imaging studies No results found for this or any previous visit (from the past 24 hour(s)).  Recent Results (from the past 240 hour(s))  Culture, blood (routine x 2)     Status: None   Collection Time: 05/31/20  8:07 AM   Specimen: BLOOD LEFT HAND  Result Value Ref Range Status   Specimen Description BLOOD LEFT HAND  Final   Special Requests   Final    BOTTLES DRAWN AEROBIC AND ANAEROBIC Blood Culture adequate volume   Culture   Final    NO GROWTH 5 DAYS Performed at [facility], [address], [place], [place]    Report Status 06/05/2020 FINAL  Final  Culture, blood (routine x 2)     Status: None   Collection Time: 05/31/20  8:12 AM   Specimen: BLOOD RIGHT HAND  Result Value Ref Range Status   Specimen Description BLOOD RIGHT HAND  Final   Special Requests   Final    BOTTLES DRAWN AEROBIC ONLY Blood Culture results may not be optimal due to an inadequate  volume of blood received in culture bottles   Culture   Final    NO GROWTH 5 DAYS Performed at [facility], [address] [place], [place] [address]    Report Status 06/05/2020 FINAL  Final  Gram stain     Status: None   Collection Time: 05/31/20 11:52 AM   Specimen: Fluid  Result Value Ref Range Status   Specimen Description FLUID  Final    Special Requests PLEURAL FLUID LEFT  Final   Gram Stain   Final    FEW WBC PRESENT, PREDOMINANTLY PMN NO ORGANISMS SEEN Performed at [facility], [address], [place], [place] [address]    Report Status 05/31/2020 FINAL  Final  Culture, body fluid w Gram Stain-bottle     Status: None   Collection Time: 05/31/20 11:52 AM   Specimen: Fluid  Result Value Ref Range Status   Specimen Description FLUID  Final   Special Requests PLEURAL FLUID LEFT  Final   Culture   Final    NO GROWTH 5 DAYS Performed at [facility], [address], [place], [place] [address]    Report Status 06/05/2020 FINAL  Final  Culture, blood (routine x 2)     Status: None   Collection Time: 06/04/20  9:54 PM   Specimen: BLOOD LEFT HAND  Result Value Ref Range Status   Specimen Description BLOOD LEFT HAND  Final   Special Requests   Final    BOTTLES DRAWN AEROBIC AND ANAEROBIC Blood Culture adequate volume   Culture   Final    NO GROWTH 5 DAYS Performed at [facility], [address], [place], [place] [address]    Report Status 06/09/2020 FINAL  Final  Culture, blood (routine x 2)     Status: None   Collection Time: 06/04/20 10:07 PM   Specimen: BLOOD LEFT HAND  Result Value Ref Range Status   Specimen Description BLOOD LEFT HAND  Final   Special Requests   Final    BOTTLES DRAWN AEROBIC AND ANAEROBIC Blood Culture adequate volume   Culture   Final    NO GROWTH 5 DAYS Performed at [facility], [address], [place], [place] [address]    Report Status 06/09/2020 FINAL  Final     Radiology Studies: DG CHEST PORT 1 VIEW  Result Date: 06/08/2020 CLINICAL DATA:  Shortness of breath, chest pain. EXAM: PORTABLE CHEST 1 VIEW COMPARISON:  June 05, 2020. FINDINGS: Mild improvement in left basilar opacities. Otherwise, similar patchy multifocal opacities. Similar numerous cystic lucencies, compatible with cavitary lesion seen on prior CT chest. No visible pneumothorax on this semi  erect radiograph. Suspected small bilateral pleural effusions, unchanged. Similar cardiac silhouette, within normal limits. Similar positioning of a right upper extremity PICC. IMPRESSION: 1. Slight improvement in left basilar opacities. Otherwise, similar multifocal opacities and cystic lucencies. 2. Similar suspected small bilateral pleural effusions Electronically Signed   By: Feliberto Harts MD   On: 06/08/2020 09:10   DG CHEST PORT 1 VIEW  Final Result    DG CHEST PORT 1 VIEW  Final Result    [place] EKG SITE RITE  Final Result    DG CHEST PORT 1 VIEW  Final Result    DG Chest Port 1 View  Final Result    DG CHEST PORT 1 VIEW  Final Result    CT CHEST WO CONTRAST  Final Result    DG Chest 1 View  Final Result    US THORACENTESIS ASP PLEURAL SPACE W/IMG GUIDE  Final Result    DG CHEST PORT 1 VIEW  Final Result    DG CHEST PORT 1 VIEW  Final Result    US THORACENTESIS ASP PLEURAL SPACE W/IMG GUIDE  Final Result    CT CHEST WO CONTRAST  Final Result    DG CHEST PORT 1 VIEW  Final Result    CT HEAD WO CONTRAST  Final Result    US RENAL  Final Result    DG CHEST PORT 1 VIEW  Final Result    VAS [place] UPPER EXTREMITY VENOUS DUPLEX  Final Result    CT Chest Wo Contrast  Final Result    DG Chest Port 1 View  Final Result      Scheduled Meds: . Chlorhexidine Gluconate Cloth  6 each Topical Daily  . feeding supplement  237 mL Oral BID BM  . heparin  5,000 Units Subcutaneous Q8H  . multivitamin with minerals  1 tablet Oral Daily  . sodium chloride flush  10 mL Intracatheter Q8H  . sodium chloride flush  10-40 mL Intracatheter Q12H   PRN Meds: acetaminophen, albuterol, docusate sodium, ondansetron, oxyCODONE, polyethylene glycol, sodium chloride flush Continuous Infusions: .  ceFAZolin (ANCEF) IV 2 g (06/09/20 1433)     LOS: 16 days  Time spent: Greater than 50% of the 35 minute visit was spent in counseling/coordination of care for the patient as laid  out in the A&P.   Lewie Chamber, MD [facility] 06/09/2020, 3:02 PM

## 2020-06-09 NOTE — Plan of Care (Signed)

## 2020-06-09 NOTE — TOC Progression Note (Addendum)
Transition of Care [facility]) - Progression Note    Patient Details  Name: Edward Mueller MRN: [id number] Date of Birth: [birth year]  Transition of Care [facility]) CM/SW Contact  Leone Haven, RN Phone Number: 06/09/2020, 12:50 PM  Clinical Narrative:    Patient is set up with [facility] for IV ABX, and Jeri Modena with [employer] will provide the medication.  Per the patient , sister just started a new job so she will not be available to assist him with the iv abx.  NCM called sister, could not leave message vm full. NCM contacted Pam to see if she could go back and do teaching with the patient and to let us know if she feels comfortable with him doing the iv abx himself.  Per Jeri Modena she did teaching with him and his mom in the room today and the Mom and Patient both informed this NCM they feel comfortable doing the iv abx at home.  Pam states they did well during the teaching.  Mom will transport patient home today.     Expected Discharge Plan: Home w Home Health Services Barriers to Discharge: Continued Medical Work up  Expected Discharge Plan and Services Expected Discharge Plan: Home w Home Health Services In-house Referral: NA Discharge Planning Services: CM Consult Post Acute Care Choice: Home Health Living arrangements for the past 2 months: Single Family Home                           HH Arranged: RN [facility] Agency: Other - See comment ([facility]) Date HH Agency Contacted: 06/04/20 Time HH Agency Contacted: 1600 Representative spoke with at [facility] Agency: Revonda Standard   Social Determinants of Health (SDOH) Interventions    Readmission Risk Interventions No flowsheet data found.

## 2020-06-09 NOTE — TOC Transition Note (Addendum)
Transition of Care [facility]) - CM/SW Discharge Note   Patient Details  Name: Siyon Linck MRN: [id number] Date of Birth: 1970-08-10  Transition of Care [facility]) CM/SW Contact:  Leone Haven, RN Phone Number: 06/09/2020, 3:53 PM   Clinical Narrative:    Patient is set up with [facility] for IV ABX, and Jeri Modena with [facility] will provide the medication.  Per the patient , sister just started a new job so she will not be available to assist him with the iv abx.  NCM called sister, could not leave message vm full. NCM contacted Pam to see if she could go back and do teaching with the patient and to let us know if she feels comfortable with him doing the iv abx himself.  Per Jeri Modena she did teaching with him and his mom in the room today and the Mom and Patient both informed this NCM they feel comfortable doing the iv abx at home.  Pam states they did well during the teaching he also has a video to go by, patient will finish his rocephin today then dc, and he will do his first dose himself tonight  At 10 and [facility] will see him in the am tomorrow.   Mom will transport patient home today.  NCM notified Revonda Standard with [facility] patient is for dc today. Patient also needs a rolling walker, NCM made referral to [facility] with [employer] for rolling walker thru charity.     Final next level of care: Home w Home Health Services Barriers to Discharge: No Barriers Identified   Patient Goals and CMS Choice Patient states their goals for this hospitalization and ongoing recovery are:: to return home   Choice offered to / list presented to : NA  Discharge Placement                       Discharge Plan and Services In-house Referral: NA Discharge Planning Services: CM Consult Post Acute Care Choice: Home Health                    HH Arranged: RN [facility] Agency:  ([facility]) Date HH Agency Contacted: 06/04/20 Time HH Agency Contacted: 1600 Representative spoke with at [facility]  Agency: [facility]  Social Determinants of Health (SDOH) Interventions     Readmission Risk Interventions No flowsheet data found.

## 2020-06-24 ENCOUNTER — Encounter: Payer: Self-pay | Admitting: Infectious Disease

## 2020-06-26 ENCOUNTER — Telehealth: Payer: Self-pay

## 2020-06-26 NOTE — Telephone Encounter (Signed)
Contacted patient and he confirmed he has had no dizziness, no sweating, and has drank soda incase this level 2 Glucose result is an indication that glucose is dropping. Advised that he should get a visit from [facility] nurse for repeat labs. Recvd verbal order to redraw and contacted [facility] at [facility] to send [facility].

## 2020-06-26 NOTE — Telephone Encounter (Signed)
[facility] calling with critical lab Glucose PANIC LEVEL result: 2. Do you want a repeat lab? Please advise.

## 2020-07-03 ENCOUNTER — Other Ambulatory Visit: Payer: Self-pay

## 2020-07-03 ENCOUNTER — Telehealth: Payer: Self-pay

## 2020-07-03 ENCOUNTER — Ambulatory Visit (INDEPENDENT_AMBULATORY_CARE_PROVIDER_SITE_OTHER): Payer: Self-pay | Admitting: Infectious Disease

## 2020-07-03 ENCOUNTER — Encounter: Payer: Self-pay | Admitting: Infectious Disease

## 2020-07-03 VITALS — BP 116/72 | HR 89 | Temp 99.4°F | Wt 144.2 lb

## 2020-07-03 DIAGNOSIS — Z9889 Other specified postprocedural states: Secondary | ICD-10-CM

## 2020-07-03 DIAGNOSIS — J90 Pleural effusion, not elsewhere classified: Secondary | ICD-10-CM

## 2020-07-03 DIAGNOSIS — J869 Pyothorax without fistula: Secondary | ICD-10-CM

## 2020-07-03 DIAGNOSIS — B9561 Methicillin susceptible Staphylococcus aureus infection as the cause of diseases classified elsewhere: Secondary | ICD-10-CM

## 2020-07-03 DIAGNOSIS — N179 Acute kidney failure, unspecified: Secondary | ICD-10-CM

## 2020-07-03 DIAGNOSIS — I39 Endocarditis and heart valve disorders in diseases classified elsewhere: Secondary | ICD-10-CM

## 2020-07-03 DIAGNOSIS — R7881 Bacteremia: Secondary | ICD-10-CM

## 2020-07-03 NOTE — Progress Notes (Signed)
Subjective:  Chief complaint still little bit of dyspnea and wondering if we should check a chest x-ray   Patient ID: Edward Mueller, male    DOB: Jan 09, 1971, 51 y.o.   MRN: [id number]  HPI  50 y.o. 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  For some period of time in the hospital he was continued to have fevers likely due to the ongoing necrosis of tissue in his lungs.  These eventually resolved.  He had long since cleared his blood cultures.  Ultimately was discharged to home to complete 6 weeks of IV cefazolin for endocarditis which will be completed on his Monday.  He is doing much better but still has a little bit of dyspnea at times and is more than anything anxious about how his lungs are doing.  He had an area in his left axilla that has been infected which now has resolved.    No past medical history on file.  Past Surgical History:  Procedure Laterality Date  . TEE WITHOUT CARDIOVERSION N/A 05/27/2020   Procedure: TRANSESOPHAGEAL ECHOCARDIOGRAM (TEE);  Surgeon: Sanda Klein, MD;  Location: [facility] ENDOSCOPY;  Service: Cardiovascular;  Laterality: N/A;    No family history on file.    Social History   Socioeconomic History  . Marital status: Single    Spouse name: Not on file  . Number of children: Not on file  . Years of education: Not on file  . Highest education level: Not on file  Occupational History  . Not on file  Tobacco Use  . Smoking status: Current Every Day Smoker    Years: 20.00  . Smokeless tobacco: Never Used  Vaping Use  . Vaping Use: Unknown  Substance and Sexual Activity  . Alcohol use: Not Currently  . Drug use: Never  . Sexual activity: Yes  Other Topics Concern  . Not on file  Social History Narrative  . Not on file   Social Determinants of Health   Financial Resource Strain: Not on file  Food Insecurity: Not on file   Transportation Needs: Not on file  Physical Activity: Not on file  Stress: Not on file  Social Connections: Not on file    No Known Allergies   Current Outpatient Medications:  .  ceFAZolin (ANCEF) IVPB, Inject 2 g into the vein every 8 (eight) hours. Indication: Bacteremia First Dose: Yes Last Day of Therapy: 07/06/20 Labs - Once weekly:  CBC/D and BMP, Labs - Every other week:  ESR and CRP Method of administration: IV Push Method of administration may be changed at the discretion of home infusion pharmacist based upon assessment of the patient and/or caregiver's ability to self-administer the medication ordered., Disp: 105 Units, Rfl: 0 .  Multiple Vitamin (MULTIVITAMIN WITH MINERALS) TABS tablet, Take 1 tablet by mouth daily., Disp: , Rfl:   Review of Systems  Constitutional: Negative for activity change, appetite change, chills, diaphoresis, fatigue, fever and unexpected weight change.  HENT: Negative for congestion, rhinorrhea, sinus pressure, sneezing, sore throat and trouble swallowing.   Eyes: Negative for photophobia and visual disturbance.  Respiratory: Positive for shortness of breath. Negative for cough, chest tightness, wheezing and stridor.   Cardiovascular: Negative for chest pain, palpitations and leg swelling.  Gastrointestinal: Negative for abdominal distention, abdominal pain, anal bleeding, blood in stool, constipation, diarrhea, nausea and vomiting.  Genitourinary: Negative for difficulty urinating, dysuria, flank pain and hematuria.  Musculoskeletal:  Negative for arthralgias, back pain, gait problem, joint swelling and myalgias.  Skin: Negative for color change, pallor, rash and wound.  Neurological: Negative for dizziness, tremors, weakness and light-headedness.  Hematological: Negative for adenopathy. Does not bruise/bleed easily.  Psychiatric/Behavioral: Negative for agitation, behavioral problems, confusion, decreased concentration, dysphoric mood and sleep  disturbance.       Objective:   Physical Exam Constitutional:      Appearance: He is well-developed.  HENT:     Head: Normocephalic and atraumatic.  Eyes:     Conjunctiva/sclera: Conjunctivae normal.  Cardiovascular:     Rate and Rhythm: Normal rate and regular rhythm.     Pulses: Normal pulses.     Heart sounds: Normal heart sounds. No murmur heard. No friction rub. No gallop.   Pulmonary:     Effort: Pulmonary effort is normal. No respiratory distress.     Breath sounds: Examination of the right-lower field reveals decreased breath sounds. Examination of the left-lower field reveals decreased breath sounds. Decreased breath sounds present. No wheezing or rhonchi.  Chest:     Chest wall: No tenderness.  Abdominal:     General: There is no distension.     Palpations: Abdomen is soft.  Musculoskeletal:        General: No tenderness. Normal range of motion.     Cervical back: Normal range of motion and neck supple.  Skin:    General: Skin is warm and dry.     Coloration: Skin is not pale.     Findings: No erythema or rash.  Neurological:     Mental Status: He is alert and oriented to person, place, and time.     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  He is not insured so we will have to pay out-of-pocket for this we did find the price he would have to pay if he did not hear at [place] on [employer].  I spent greater than 40 minutes with the patient including greater than 50% of time in face to face counsel of the patient and his sister and in coordination of his  care.Marland Kitchen

## 2020-07-03 NOTE — Telephone Encounter (Signed)
I spoke with [name] with [facility] and gave verbal orders to remove picc after last dose on 07/06/20 per Dr Daiva Eves. [name] verbalized understanding. [name]

## 2020-07-09 ENCOUNTER — Telehealth: Payer: Self-pay

## 2020-07-09 NOTE — Telephone Encounter (Signed)
Patient called to see when he would be cleared to return to work. Patient is still out and was not sure if he should return now or wait until his follow up on 6/8. Patient does report continued shortness of breath requiring rest.   Rosanna Randy, RN

## 2020-07-10 ENCOUNTER — Other Ambulatory Visit: Payer: Self-pay | Admitting: Infectious Disease

## 2020-07-10 DIAGNOSIS — I76 Septic arterial embolism: Secondary | ICD-10-CM

## 2020-07-10 NOTE — Telephone Encounter (Signed)
Called patient to notify him of referral being placed with Pulmonology for follow up. Instructed him that he should remain out of work until he's assessed by their office. Patient verbalized understanding and will follow up as scheduled.  Nyleah Mcginnis Loyola Mast, RN

## 2020-07-10 NOTE — Telephone Encounter (Signed)
I put in a referral I had assumed that pulmonary would see him in follow-up because they had managed him while he had chest tubes in place

## 2020-07-13 ENCOUNTER — Inpatient Hospital Stay: Payer: Self-pay | Admitting: Family Medicine

## 2020-07-29 ENCOUNTER — Other Ambulatory Visit: Payer: Self-pay

## 2020-07-29 ENCOUNTER — Ambulatory Visit (INDEPENDENT_AMBULATORY_CARE_PROVIDER_SITE_OTHER): Payer: Self-pay | Admitting: Infectious Disease

## 2020-07-29 VITALS — BP 149/89 | HR 65 | Temp 97.8°F | Wt 151.0 lb

## 2020-07-29 DIAGNOSIS — Z9889 Other specified postprocedural states: Secondary | ICD-10-CM

## 2020-07-29 DIAGNOSIS — B9561 Methicillin susceptible Staphylococcus aureus infection as the cause of diseases classified elsewhere: Secondary | ICD-10-CM

## 2020-07-29 DIAGNOSIS — R6520 Severe sepsis without septic shock: Secondary | ICD-10-CM

## 2020-07-29 DIAGNOSIS — R7881 Bacteremia: Secondary | ICD-10-CM

## 2020-07-29 DIAGNOSIS — I39 Endocarditis and heart valve disorders in diseases classified elsewhere: Secondary | ICD-10-CM

## 2020-07-29 DIAGNOSIS — A419 Sepsis, unspecified organism: Secondary | ICD-10-CM

## 2020-07-29 NOTE — Progress Notes (Signed)
Subjective:  Chief complaint still with some dyspnea on exertion  Patient ID: Edward Mueller, male    DOB: 03/22/1970, 50 y.o.   MRN: [id number]  HPI  50 y.o. 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  For some period of time in the hospital he was continued to have fevers likely due to the ongoing necrosis of tissue in his lungs.  These eventually resolved.  He had long since cleared his blood cultures.  Ultimately was discharged to home to complete 6 weeks of IV cefazolin for endocarditis which will be completed.  He is doing well with no evidence of fevers chills nausea malaise or other systemic symptoms he still does suffer thousand dyspnea on exertion.  He is going to be seeing  [facility] in a few weeks.     No past medical history on file.  Past Surgical History:  Procedure Laterality Date  . TEE WITHOUT CARDIOVERSION N/A 05/27/2020   Procedure: TRANSESOPHAGEAL ECHOCARDIOGRAM (TEE);  Surgeon: Thurmon Fair, MD;  Location: [facility] ENDOSCOPY;  Service: Cardiovascular;  Laterality: N/A;    No family history on file.    Social History   Socioeconomic History  . Marital status: Single    Spouse name: Not on file  . Number of children: Not on file  . Years of education: Not on file  . Highest education level: Not on file  Occupational History  . Not on file  Tobacco Use  . Smoking status: Current Every Day Smoker    Years: 20.00  . Smokeless tobacco: Never Used  Vaping Use  . Vaping Use: Unknown  Substance and Sexual Activity  . Alcohol use: Not Currently  . Drug use: Never  . Sexual activity: Yes  Other Topics Concern  . Not on file  Social History Narrative  . Not on file   Social Determinants of Health   Financial Resource Strain: Not on file  Food Insecurity: Not on file  Transportation Needs: Not on file  Physical Activity: Not on file   Stress: Not on file  Social Connections: Not on file    No Known Allergies   Current Outpatient Medications:  Marland Kitchen  Multiple Vitamin (MULTIVITAMIN WITH MINERALS) TABS tablet, Take 1 tablet by mouth daily., Disp: , Rfl:   Review of Systems  Constitutional: Negative for activity change, appetite change, chills, diaphoresis, fatigue, fever and unexpected weight change.  HENT: Negative for congestion, rhinorrhea, sinus pressure, sneezing, sore throat and trouble swallowing.   Eyes: Negative for photophobia and visual disturbance.  Respiratory: Positive for shortness of breath. Negative for cough, chest tightness, wheezing and stridor.   Cardiovascular: Negative for chest pain, palpitations and leg swelling.  Gastrointestinal: Negative for abdominal distention, abdominal pain, anal bleeding, blood in stool, constipation, diarrhea, nausea and vomiting.  Genitourinary: Negative for difficulty urinating, dysuria, flank pain and hematuria.  Musculoskeletal: Negative for arthralgias, back pain, gait problem, joint swelling and myalgias.  Skin: Negative for color change, pallor, rash and wound.  Neurological: Negative for dizziness, tremors, weakness and light-headedness.  Hematological: Negative for adenopathy. Does not bruise/bleed easily.  Psychiatric/Behavioral: Negative for agitation, behavioral problems, confusion, decreased concentration, dysphoric mood and sleep disturbance.       Objective:   Physical Exam Constitutional:      Appearance: He is well-developed.  HENT:     Head: Normocephalic and atraumatic.  Eyes:     Conjunctiva/sclera: Conjunctivae normal.  Cardiovascular:  Rate and Rhythm: Normal rate and regular rhythm.     Pulses: Normal pulses.     Heart sounds: Normal heart sounds. No murmur heard. No friction rub. No gallop.   Pulmonary:     Effort: Pulmonary effort is normal. No respiratory distress.     Breath sounds: Examination of the right-lower field reveals  decreased breath sounds. Decreased breath sounds present. No wheezing or rhonchi.  Chest:     Chest wall: No tenderness.  Abdominal:     General: There is no distension.     Palpations: Abdomen is soft.  Musculoskeletal:        General: No tenderness. Normal range of motion.     Cervical back: Normal range of motion and neck supple.  Skin:    General: Skin is warm and dry.     Coloration: Skin is not pale.     Findings: No erythema or rash.  Neurological:     General: No focal deficit present.     Mental Status: He is alert and oriented to person, place, and time.  Psychiatric:        Mood and Affect: Mood normal.        Behavior: Behavior normal.        Thought Content: Thought content normal.        Judgment: Judgment normal.         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 I would hope his empyema is completely resolved  I spent more than 30 minutes with the patient including greater than 50% of time in face to face counseling of the patient personally reviewing radiographs, along with pertinent laboratory microbiological data review of medical records and in coordination of his care.

## 2020-08-04 ENCOUNTER — Institutional Professional Consult (permissible substitution): Payer: Self-pay | Admitting: Emergency Medicine

## 2020-08-04 LAB — CULTURE, BLOOD (SINGLE)
MICRO NUMBER:: 11984560
MICRO NUMBER:: 11984561
Result:: NO GROWTH
Result:: NO GROWTH
SPECIMEN QUALITY:: ADEQUATE
SPECIMEN QUALITY:: ADEQUATE

## 2020-08-26 ENCOUNTER — Inpatient Hospital Stay: Payer: Self-pay | Admitting: Family Medicine

## 2020-08-27 ENCOUNTER — Other Ambulatory Visit: Payer: Self-pay

## 2020-08-27 ENCOUNTER — Ambulatory Visit: Payer: Self-pay | Attending: Family Medicine | Admitting: Family Medicine

## 2020-09-09 ENCOUNTER — Ambulatory Visit (INDEPENDENT_AMBULATORY_CARE_PROVIDER_SITE_OTHER): Payer: Self-pay | Admitting: Pulmonary Disease

## 2020-09-09 ENCOUNTER — Other Ambulatory Visit: Payer: Self-pay

## 2020-09-09 ENCOUNTER — Encounter: Payer: Self-pay | Admitting: Pulmonary Disease

## 2020-09-09 VITALS — BP 118/78 | HR 61 | Ht 75.0 in | Wt 152.4 lb

## 2020-09-09 DIAGNOSIS — I76 Septic arterial embolism: Secondary | ICD-10-CM

## 2020-09-09 NOTE — Patient Instructions (Signed)
Nice to meet you  We will get a CT scan in the coming days to make sure that the spots in your lungs, pneumonia, it is still healing.  I suspect some your shortness of breath is related to being rundown from the infection in the hospitalization.  I expect this will improve as you increase your activity.  I would encourage you to try to slowly increase your activity to build up stamina.  Follow-up with Dr. Judeth Horn in 3 months.  If your shortness of breath continues and the CT scan looks much improved, we can discuss additional testing and treatment in the future.

## 2020-09-13 NOTE — Progress Notes (Signed)
@Patient  ID: , male    DOB: 05-28-1970, 50 y.o.   MRN: [id number]  Chief Complaint  Patient presents with   Consult    Patient is her for a consult for septic embolism. He does not have any concerns.     Referring provider: [id number], Daiva Eves, MD  HPI:   50 year old whom we are seeing in consultation for evaluation of septic emboli.  Note from referring provider reviewed, most recent ID note.  Discharge summary from 05/2020 hospitalization reviewed.  Patient was normal state health.  Rarely donates plasma.  Noted some redness and induration at site of needle insertion and left AC.  Then developed fever, chest pain.  Admitted to the hospital.  Blood culture positive for MSSA.  CT scan with diffuse peripheral predominant cavitary lesions with subsequent improvement on second CT scan during admission on my interpretation.  Also bronchiectasis present likely sequela of inflammatory state.  TEE was negative for vegetation.  Was directed to IV therapy via ID.  Slow improvement in dyspnea on exertion.  Still mild.  But getting better.  Worse with inclines or stairs.  No timing they were better or worse.  No position in which things are better or worse.  No medications make things better or worse.  No alleviating or exacerbating factors.  Overall chest discomfort has been greatly improved.  He is gaining weight.  Appetite is good.  PMH: MSSA bacteremia Surgical history: TEE Family history:History reviewed. No pertinent family history. Social history: Former smoker about 10-pack-year history, quit during hospitalization for septic emboli, MSSA bacteremia, works in 06/2020, lives in [place]  ACT:  No flowsheet data found.  MMRC: No flowsheet data found.  Epworth:  No flowsheet data found.  Tests:   FENO:  No results found for: NITRICOXIDE  PFT: No flowsheet data found.  WALK:  No flowsheet data found.  Imaging: Personally reviewed as per EMR discussion in  this note.  Lab Results: Personally reviewed, mild anemia at time of discharge from hospitalization CBC    Component Value Date/Time   WBC 14.5 (H) 06/08/2020 0500   RBC 2.77 (L) 06/08/2020 0500   HGB 8.9 (L) 06/08/2020 0500   HCT 26.9 (L) 06/08/2020 0500   PLT 890 (H) 06/08/2020 0500   MCV 97.1 06/08/2020 0500   MCH 32.1 06/08/2020 0500   MCHC 33.1 06/08/2020 0500   RDW 13.0 06/08/2020 0500   LYMPHSABS 0.7 06/08/2020 0500   MONOABS 1.2 (H) 06/08/2020 0500   EOSABS 0.2 06/08/2020 0500   BASOSABS 0.1 06/08/2020 0500    BMET    Component Value Date/Time   NA 132 (L) 06/08/2020 0500   K 4.4 06/08/2020 0500   CL 99 06/08/2020 0500   CO2 27 06/08/2020 0500   GLUCOSE 98 06/08/2020 0500   BUN 10 06/08/2020 0500   CREATININE 1.01 06/08/2020 0500   CALCIUM 8.0 (L) 06/08/2020 0500   GFRNONAA >60 06/08/2020 0500    BNP    Component Value Date/Time   BNP 140.4 (H) 05/29/2020 0308    ProBNP No results found for: PROBNP  Specialty Problems       Pulmonary Problems   Hypoxia   Pleural effusion on left    No Known Allergies   There is no immunization history on file for this patient.  History reviewed. No pertinent past medical history.  Tobacco History: Social History   Tobacco Use  Smoking Status Former   Packs/day: 0.50   Years: 20.00   Pack  years: 10.00   Types: Cigarettes   Quit date: 05/22/2020   Years since quitting: 0.3  Smokeless Tobacco Never   Counseling given: Not Answered   Continue to not smoke  Outpatient Encounter Medications as of 09/09/2020  Medication Sig   Multiple Vitamin (MULTIVITAMIN WITH MINERALS) TABS tablet Take 1 tablet by mouth daily.   No facility-administered encounter medications on file as of 09/09/2020.     Review of Systems  Review of Systems  No chest pain.  No orthopnea or PND.  No lower extremity swelling.  Comprehensive review of system otherwise negative. Physical Exam  BP 118/78 (BP Location: Left Arm,  Patient Position: Sitting, Cuff Size: Normal)   Pulse 61   Ht 6\' 3"  (1.905 m)   Wt 152 lb 6.4 oz (69.1 kg)   SpO2 100%   BMI 19.05 kg/m   Wt Readings from Last 5 Encounters:  09/09/20 152 lb 6.4 oz (69.1 kg)  07/29/20 151 lb (68.5 kg)  07/03/20 144 lb 3.2 oz (65.4 kg)  06/09/20 134 lb 11.2 oz (61.1 kg)    BMI Readings from Last 5 Encounters:  09/09/20 19.05 kg/m  07/29/20 18.87 kg/m  07/03/20 18.02 kg/m  06/09/20 16.84 kg/m     Physical Exam General: Well-appearing, no acute distress Eyes: EOMI, icterus Neck: Supple no JVP Cardiovascular: Regular rate and rhythm, no murmur appreciated Pulmonary: Clear to auscultate bilateral, no wheezes or crackles, normal work of breathing Abdomen: Nondistended, bowel sounds present MSK: No synovitis, no joint effusion Neuro: Normal gait, no weakness Psych: Normal mood, full affect   Assessment & Plan:   Septic Emboli due to MSSA bacteremia: Improved on serial imaging during hospitalization 05/2020.  --Repeat CT to evaluate for ongoing improvement  DOE: Multifactorial related to parenchymal lung disease in the setting of septic emboli are improving.  Also likely related to mild deconditioning in the setting of his hyper inflammatory state with MSSA endocarditis as well as deconditioning from hospitalization.  Gradually improving with time.  Suspect will continue to improve.  Recommend graduated exercise program.   Return in about 3 months (around 12/10/2020).   12/12/2020, MD 09/13/2020

## 2020-09-17 ENCOUNTER — Ambulatory Visit (HOSPITAL_COMMUNITY): Payer: Self-pay

## 2020-09-23 ENCOUNTER — Encounter: Payer: Self-pay | Admitting: Physician Assistant

## 2020-09-23 ENCOUNTER — Other Ambulatory Visit: Payer: Self-pay

## 2020-09-23 ENCOUNTER — Ambulatory Visit: Payer: Self-pay | Attending: Physician Assistant | Admitting: Physician Assistant

## 2020-09-23 DIAGNOSIS — R6520 Severe sepsis without septic shock: Secondary | ICD-10-CM

## 2020-09-23 DIAGNOSIS — I39 Endocarditis and heart valve disorders in diseases classified elsewhere: Secondary | ICD-10-CM

## 2020-09-23 DIAGNOSIS — E871 Hypo-osmolality and hyponatremia: Secondary | ICD-10-CM

## 2020-09-23 DIAGNOSIS — R0609 Other forms of dyspnea: Secondary | ICD-10-CM

## 2020-09-23 DIAGNOSIS — D72829 Elevated white blood cell count, unspecified: Secondary | ICD-10-CM

## 2020-09-23 DIAGNOSIS — Z09 Encounter for follow-up examination after completed treatment for conditions other than malignant neoplasm: Secondary | ICD-10-CM

## 2020-09-23 DIAGNOSIS — R0600 Dyspnea, unspecified: Secondary | ICD-10-CM

## 2020-09-23 DIAGNOSIS — N179 Acute kidney failure, unspecified: Secondary | ICD-10-CM

## 2020-09-23 DIAGNOSIS — A419 Sepsis, unspecified organism: Secondary | ICD-10-CM

## 2020-09-23 NOTE — Progress Notes (Signed)
Patient ID: Edward Mueller, male   DOB: 1971-01-26, 50 y.o.   MRN: [id number] Virtual Visit via Telephone Note  I connected with Edward Mueller on 09/23/20 at  9:30 AM EDT by telephone and verified that I am speaking with the correct person using two identifiers.  Location: Patient: home Provider: [facility] office   I discussed the limitations, risks, security and privacy concerns of performing an evaluation and management service by telephone and the availability of in person appointments. I also discussed with the patient that there may be a patient responsible charge related to this service. The patient expressed understanding and agreed to proceed.   History of Present Illness: After hospitalization 4/3-4/19/2022 with septic emboli amd endocarditis with other complications(see below).  Being followed by ID (most recently 6/8)and pulmonology (7/20)post hospitalization.  Needs PCP.  He is doing well overall.  Still has some residual DOE that pulmonology feels will continue to improve.  Energy levels improving.  Appetite is good.    From discharge summary: Hospital Course: 50 year old gentleman with no reported medical problems who donates plasma 2 times a week, donated plasma on 05/22/20 and after about 24 hours he started not feeling well.  Reported symptoms included generalized malaise, nausea, vomiting, shortness of breath, pleuritic chest pain, cough with blood-tinged sputum.  Came to the emergency room on 05/24/20, was febrile, tachypneic with lactic acidosis.  Found to have multilobular pneumonia on chest x-ray, left upper extremity cellulitis and left acute superficial vein thrombosis involving the left cephalic vein on 09/29/2798.   Was started on IV antibiotics and IV fluids and admitted to ICU for closer monitoring then later transferred to progressive care unit.   Work-up revealed MSSA bacteremia for which infectious disease was consulted.  No vegetation was seen on TEE on 05/27/2020.  Sputum  culture from 05/29/2020 growing abundant gram-negative rods, abundant gram-positive cocci with few yeast, culture was reincubated for better growth.  He has been on cefazolin for MSSA bacteremia as recommended by infectious disease.  Repeated CT chest without contrast done on 05/30/2020 showed interval worsening of the aeration of the lungs.  IR was consulted for thoracentesis and chest tube placement due to loculated pleural effusion.   Post right thoracentesis by IR on 05/30/2020 with approximately 300 cc translucent, serosanguineous fluid removed and right chest tube placement.  Post left thoracentesis by IR on 05/31/2020 with 550 cc dark red fluid removed.  Chest tube was managed by PCCM.  Right-sided chest tube was removed on 06/02/2020 with no reaccumulation of fluid.   He remained stable with intermittent fevers which were expected in setting of his cavitating lung lesions.  He underwent antibiotic administration teaching prior to discharge with his mom present and ultimately felt comfortable discharging home with ongoing management.   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 No evidence of valvular vegetation on TEE. Appreciate infectious disease and PCCM assistance. He is currently on IV cefazolin, continue as recommended by infectious disease.   - Plan is for 6 weeks treatment, end date 07/06/2020 - recurrent fevers, BCx were repeated on 06/04/20: negative to date - BCx from 4/4 and 4/10 are negative -  fever curve stable and expected given cavitary lesions (will take weeks to resolve) -PICC line placed 06/04/2020 - continue IS - repeat CXR on 4/18 is stable; no reaccumulation.   Acute hypoxic respiratory failure 2/2 PNA Septic pulmonary emboli with cavitating lesions Loculated right parapneumonic effusion, simple left parapneumonic effusion On IV antibiotics as stated  above. Post left and right thoracentesis and right chest tube  placement by IR. -Right chest tube removed on 06/02/2020.  No significant reaccumulation on CXR obtained on 06/03/2020 and 06/05/20 - s/p right chest tube removed 06/02/20 Continue to maintain O2 saturation greater than 90%. Now on RA - continue IS   Severe protein calorie malnutrition due to severe critical illness Albumin 1.4 on 05/31/2020 BMI 19 Dietitian consulted for assessment of nutrition requirements Continue to encourage increase in oral protein calorie intake.    Resolved post repletion.  Hypocalcemia   Resolved Non anion gap metabolic acidosis   AKI on CKD 3B, improving. Presented with creatinine of 2.7 Creatinine downtrending.  Creatinine 1.01 at discharge   Hypovolemic hyponatremia Encourage increase in oral intake - stable     Hypokalemia Repleted Repeat BMP as needed   Hypophosphatemia - repleted   Resolved left upper extremity cellulitis Initially presented with left upper extremity cellulitis which has been treated and has now resolved   Left upper extremity acute superficial vein thrombosis Continue to monitor   THC/tobacco use UDS positive for THC on 05/24/2020 Denies use of cocaine or intravenous drug abuse Cessation counseling when hemodynamically stable.   Generalized weakness in the setting of severe, critical illness Out of bed to chair with every shift as tolerated Ambulate patient with every shift as tolerated Increase oral protein calorie intake as tolerated PT assessed with no further recommendations. Continue fall precautions.   Observations/Objective:  NAD.  A&Ox3   Assessment and Plan: 1. Hyponatremia - Comprehensive metabolic panel; Future  2. Leukocytosis, unspecified type - CBC with Differential/Platelet; Future  3. Severe sepsis (HCC) resolved - CBC with Differential/Platelet; Future  4. AKI (acute kidney injury) (HCC) Likely improved/resolved - Comprehensive metabolic panel; Future  5. Endocarditis and heart valve  disorders in diseases classified elsewhere - CBC with Differential/Platelet; Future - Comprehensive metabolic panel; Future  6. DOE (dyspnea on exertion) improving  7. Hospital discharge follow-up Doing well overall.  No meds    Follow Up Instructions: Assign PCP in about 2 months   I discussed the assessment and treatment plan with the patient. The patient was provided an opportunity to ask questions and all were answered. The patient agreed with the plan and demonstrated an understanding of the instructions.   The patient was advised to call back or seek an in-person evaluation if the symptoms worsen or if the condition fails to improve as anticipated.  I provided 13 minutes of non-face-to-face time during this encounter.   Georgian Co, PA-C

## 2020-10-12 ENCOUNTER — Telehealth: Payer: Self-pay | Admitting: *Deleted

## 2020-10-12 NOTE — Telephone Encounter (Signed)
-----   Message from Anders Simmonds, [place] sent at 09/23/2020  9:01 AM EDT ----- Assign PCP in about 2 months

## 2020-10-12 NOTE — Telephone Encounter (Addendum)
Pt has been assign to dr Laural Benes

## 2020-12-10 ENCOUNTER — Other Ambulatory Visit: Payer: Self-pay

## 2020-12-10 ENCOUNTER — Ambulatory Visit: Payer: Medicaid Other | Attending: Internal Medicine | Admitting: Internal Medicine

## 2020-12-10 ENCOUNTER — Encounter: Payer: Self-pay | Admitting: Internal Medicine

## 2020-12-10 VITALS — BP 124/78 | HR 60 | Resp 16 | Ht 75.0 in | Wt 151.0 lb

## 2020-12-10 DIAGNOSIS — R7881 Bacteremia: Secondary | ICD-10-CM | POA: Diagnosis not present

## 2020-12-10 DIAGNOSIS — Z87891 Personal history of nicotine dependence: Secondary | ICD-10-CM | POA: Insufficient documentation

## 2020-12-10 DIAGNOSIS — Z1211 Encounter for screening for malignant neoplasm of colon: Secondary | ICD-10-CM | POA: Insufficient documentation

## 2020-12-10 DIAGNOSIS — Z23 Encounter for immunization: Secondary | ICD-10-CM | POA: Diagnosis not present

## 2020-12-10 DIAGNOSIS — Z2821 Immunization not carried out because of patient refusal: Secondary | ICD-10-CM

## 2020-12-10 DIAGNOSIS — R0609 Other forms of dyspnea: Secondary | ICD-10-CM | POA: Diagnosis not present

## 2020-12-10 DIAGNOSIS — J849 Interstitial pulmonary disease, unspecified: Secondary | ICD-10-CM

## 2020-12-10 MED ORDER — ALBUTEROL SULFATE HFA 108 (90 BASE) MCG/ACT IN AERS: 2.0000 | INHALATION_SPRAY | Freq: Four times a day (QID) | RESPIRATORY_TRACT | 2 refills | Status: DC | PRN

## 2020-12-10 NOTE — Progress Notes (Signed)
Patient ID: Edward Mueller, male    DOB: 19-Sep-1970  MRN: [id number]  CC: DOE  Subjective: Edward Mueller is a 50 y.o. male who presents for est care and DOE His concerns today include:  History of left DVT, MSSA bacteremia, empyema, endocarditis, septic P.E   Patient had a telephone visit with our PA on 09/23/2020 to establish care posthospitalization in April for bacteremia with septic PE, empyema and endocarditis.  He has followed up with the infectious disease specialist Dr. Daiva Eves.  He also saw the pulmonologist Dr. Judeth Horn in July.  At that time he reported slow improvement in DOE.  The pulmonologist had ordered a repeat CAT scan of the chest.  However patient states on the day of the appointment he went to [facility] when in fact the study was to be done at [facility] so he never had it done.   He reports that his breathing is better compared to April but he still gets shortness of breath after walking about 10 to 15 minutes.  He has not had any chronic cough.  He sometimes notes a little wheezing. 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  Agrees for Tdap.  Needs colon CA screen.  No fhx of colon CA.  Has c-scope schedule  Patient Active Problem List   Diagnosis Date Noted   Severe sepsis (HCC) 06/09/2020   Protein-calorie malnutrition, severe 06/03/2020   AKI (acute kidney injury) (HCC)    Hypoxia    Pleural effusion on left    S/P thoracentesis    MSSA bacteremia    Endocarditis and heart valve disorders in diseases classified elsewhere 05/24/2020     Current Outpatient Medications on File Prior to Visit  Medication Sig Dispense Refill   Multiple Vitamin (MULTIVITAMIN WITH MINERALS) TABS tablet Take 1 tablet by mouth daily.     No current facility-administered medications on file prior to visit.    No Known Allergies  Social History   Socioeconomic History   Marital status: Single    Spouse name: Not on file   Number of  children: Not on file   Years of education: Not on file   Highest education level: Not on file  Occupational History   Not on file  Tobacco Use   Smoking status: Former    Packs/day: 0.50    Years: 20.00    Pack years: 10.00    Types: Cigarettes    Quit date: 05/22/2020    Years since quitting: 0.5   Smokeless tobacco: Never  Vaping Use   Vaping Use: Unknown  Substance and Sexual Activity   Alcohol use: Not Currently   Drug use: Never   Sexual activity: Yes  Other Topics Concern   Not on file  Social History Narrative   Not on file   Social Determinants of Health   Financial Resource Strain: Not on file  Food Insecurity: Not on file  Transportation Needs: Not on file  Physical Activity: Not on file  Stress: Not on file  Social Connections: Not on file  Intimate Partner Violence: Not on file    No family history on file.  Past Surgical History:  Procedure Laterality Date   TEE WITHOUT CARDIOVERSION N/A 05/27/2020   Procedure: TRANSESOPHAGEAL ECHOCARDIOGRAM (TEE);  Surgeon: Thurmon Fair, MD;  Location: [facility] ENDOSCOPY;  Service: Cardiovascular;  Laterality: N/A;    ROS: Review of Systems Negative except as stated above  PHYSICAL EXAM: BP  124/78   Pulse 60   Resp 16   Ht 6\' 3"  (1.905 m)   Wt 151 lb (68.5 kg)   SpO2 100%   BMI 18.87 kg/m   Wt Readings from Last 3 Encounters:  12/10/20 151 lb (68.5 kg)  09/09/20 152 lb 6.4 oz (69.1 kg)  07/29/20 151 lb (68.5 kg)    Physical Exam  General appearance - alert, middle-age African-American male who appears very slender and a little underweight for height  and in no distress Mental status - normal mood, behavior, speech, dress, motor activity, and thought processes Neck - supple, no significant adenopathy Chest - clear to auscultation, no wheezes, rales or rhonchi, symmetric air entry Heart - normal rate, regular rhythm, normal S1, S2, no murmurs, rubs, clicks or gallops.  No JVD. Extremities - peripheral pulses  normal, no pedal edema, no clubbing or cyanosis  CMP Latest Ref Rng & Units 06/08/2020 06/07/2020 06/06/2020  Glucose 70 - 99 mg/dL 98 98 99  BUN 6 - 20 mg/dL 10 9 11   Creatinine 0.61 - 1.24 mg/dL 06/08/2020 4.43  Sodium 135 - 145 mmol/L 132(L) 133(L) 134(L)  Potassium 3.5 - 5.1 mmol/L 4.4 4.4 4.4  Chloride 98 - 111 mmol/L 99 99 103  CO2 22 - 32 mmol/L 27 26 27   Calcium 8.9 - 10.3 mg/dL 8.0(L) 7.9(L) 7.5(L)  Total Protein 6.5 - 8.1 g/dL - - 4.7(L)  Total Bilirubin 0.3 - 1.2 mg/dL - - 0.5  Alkaline Phos 38 - 126 U/L - - 48  AST 15 - 41 U/L - - 48(H)  ALT 0 - 44 U/L - - 36   Lipid Panel  No results found for: CHOL, TRIG, HDL, CHOLHDL, VLDL, LDLCALC, LDLDIRECT  CBC    Component Value Date/Time   WBC 14.5 (H) 06/08/2020 0500   RBC 2.77 (L) 06/08/2020 0500   HGB 8.9 (L) 06/08/2020 0500   HCT 26.9 (L) 06/08/2020 0500   PLT 890 (H) 06/08/2020 0500   MCV 97.1 06/08/2020 0500   MCH 32.1 06/08/2020 0500   MCHC 33.1 06/08/2020 0500   RDW 13.0 06/08/2020 0500   LYMPHSABS 0.7 06/08/2020 0500   MONOABS 1.2 (H) 06/08/2020 0500   EOSABS 0.2 06/08/2020 0500   BASOSABS 0.1 06/08/2020 0500    ASSESSMENT AND PLAN: 1. DOE (dyspnea on exertion) Middle-aged African-American male with persistent DOE after experiencing severe bacteremia with empyema and septic PE in April of this year.  He had seen the pulmonologist in July and plan was for repeat CAT scan of the chest.  He missed that appointment.  I will go ahead and have my CMA reschedule for him.  I have given him an albuterol inhaler to try.  Instructed to use prior to exertion. - CBC - Comprehensive metabolic panel - Brain natriuretic peptide - albuterol (VENTOLIN HFA) 108 (90 Base) MCG/ACT inhaler; Inhale 2 puffs into the lungs every 6 (six) hours as needed for wheezing or shortness of breath.  Dispense: 8 g; Refill: 2  2. Influenza vaccination declined   3. Need for Tdap vaccination   4. Screening for colon cancer Patient reports he  has an appointment tomorrow for a colonoscopy but cannot tell me where or with home.    Patient was given the opportunity to ask questions.  Patient verbalized understanding of the plan and was able to repeat key elements of the plan.   No orders of the defined types were placed in this encounter.    Requested Prescriptions  No prescriptions requested or ordered in this encounter    No follow-ups on file.  Karle Plumber, MD, FACP

## 2020-12-11 LAB — COMPREHENSIVE METABOLIC PANEL
ALT: 14 IU/L (ref 0–44)
AST: 20 IU/L (ref 0–40)
Albumin/Globulin Ratio: 2.4 — ABNORMAL HIGH (ref 1.2–2.2)
Albumin: 4.7 g/dL (ref 3.8–4.9)
Alkaline Phosphatase: 83 IU/L (ref 44–121)
BUN/Creatinine Ratio: 11 (ref 9–20)
BUN: 13 mg/dL (ref 6–24)
Bilirubin Total: 0.4 mg/dL (ref 0.0–1.2)
CO2: 26 mmol/L (ref 20–29)
Calcium: 9.4 mg/dL (ref 8.7–10.2)
Chloride: 105 mmol/L (ref 96–106)
Creatinine, Ser: 1.17 mg/dL (ref 0.76–1.27)
Globulin, Total: 2 g/dL (ref 1.5–4.5)
Glucose: 81 mg/dL (ref 70–99)
Potassium: 4.3 mmol/L (ref 3.5–5.2)
Sodium: 143 mmol/L (ref 134–144)
Total Protein: 6.7 g/dL (ref 6.0–8.5)
eGFR: 75 mL/min/{1.73_m2} (ref >59)

## 2020-12-11 LAB — CBC
Hematocrit: 37.9 % (ref 37.5–51.0)
Hemoglobin: 13 g/dL (ref 13.0–17.7)
MCH: 32.4 pg (ref 26.6–33.0)
MCHC: 34.3 g/dL (ref 31.5–35.7)
MCV: 95 fL (ref 79–97)
Platelets: 321 10*3/uL (ref 150–450)
RBC: 4.01 x10E6/uL — ABNORMAL LOW (ref 4.14–5.80)
RDW: 14.1 % (ref 11.6–15.4)
WBC: 5.5 10*3/uL (ref 3.4–10.8)

## 2020-12-11 LAB — BRAIN NATRIURETIC PEPTIDE: BNP: 35.6 pg/mL (ref 0.0–100.0)

## 2020-12-17 ENCOUNTER — Telehealth: Payer: Self-pay

## 2020-12-17 NOTE — Telephone Encounter (Signed)
Contacted pt to go over lab results pt is aware and doesn't have any questions or concerns 

## 2020-12-23 ENCOUNTER — Ambulatory Visit (HOSPITAL_COMMUNITY): Payer: Medicaid Other

## 2020-12-24 ENCOUNTER — Encounter (HOSPITAL_COMMUNITY): Payer: Self-pay

## 2020-12-24 ENCOUNTER — Other Ambulatory Visit: Payer: Self-pay

## 2020-12-24 ENCOUNTER — Ambulatory Visit (HOSPITAL_COMMUNITY)
Admission: EM | Admit: 2020-12-24 | Discharge: 2020-12-24 | Disposition: A | Discharge status: Home / Self Care | Payer: Medicaid Other | Attending: Emergency Medicine | Admitting: Emergency Medicine

## 2020-12-24 DIAGNOSIS — M25561 Pain in right knee: Secondary | ICD-10-CM

## 2020-12-24 MED ORDER — IBUPROFEN 800 MG PO TABS: 800.0000 mg | ORAL_TABLET | Freq: Three times a day (TID) | ORAL | 0 refills | Status: AC

## 2020-12-24 MED ORDER — PREDNISONE 10 MG (21) PO TBPK: ORAL_TABLET | Freq: Every day | ORAL | 0 refills | Status: AC

## 2020-12-24 NOTE — ED Triage Notes (Signed)
Pt presents with c/o R knee pain x 4 days. Pt states he woke up one morning and felt a sudden pain.

## 2020-12-24 NOTE — ED Provider Notes (Signed)
[facility]    CSN: [id number] Arrival date & time: 12/24/20  0810      History   Chief Complaint Chief Complaint  Patient presents with   Knee Pain    HPI Edward Mueller is a 50 y.o. male.   Rt knee pain and tenderness for the past 4 days now. Denies any injury. Pain is intermit throughout the day. Able to bear wt just painful. Denies any calf pain, no sob, no chest pain. Has not taken anything pta.    History reviewed. No pertinent past medical history.  Patient Active Problem List   Diagnosis Date Noted   Severe sepsis ([place]) 06/09/2020   Protein-calorie malnutrition, severe 06/03/2020   AKI (acute kidney injury) ([place])    Hypoxia    Pleural effusion on left    S/P thoracentesis    MSSA bacteremia    Endocarditis and heart valve disorders in diseases classified elsewhere 05/24/2020    Past Surgical History:  Procedure Laterality Date   TEE WITHOUT CARDIOVERSION N/A 05/27/2020   Procedure: TRANSESOPHAGEAL ECHOCARDIOGRAM (TEE);  Surgeon: Sanda Klein, MD;  Location: [facility] ENDOSCOPY;  Service: Cardiovascular;  Laterality: N/A;       Home Medications    Prior to Admission medications   Medication Sig Start Date End Date Taking? Authorizing Provider  ibuprofen (ADVIL) 800 MG tablet Take 1 tablet (800 mg total) by mouth 3 (three) times daily. 12/24/20  Yes Marney Setting, NP  predniSONE (STERAPRED UNI-PAK 21 TAB) 10 MG (21) TBPK tablet Take by mouth daily. Take 6 tabs by mouth daily  for 2 days, then 5 tabs for 2 days, then 4 tabs for 2 days, then 3 tabs for 2 days, 2 tabs for 2 days, then 1 tab by mouth daily for 2 days 12/24/20  Yes Marney Setting, NP  albuterol (VENTOLIN HFA) 108 (90 Base) MCG/ACT inhaler Inhale 2 puffs into the lungs every 6 (six) hours as needed for wheezing or shortness of breath. 12/10/20   Ladell Pier, MD  Multiple Vitamin (MULTIVITAMIN WITH MINERALS) TABS tablet Take 1 tablet by mouth daily. 06/10/20   Dwyane Dee, MD     Family History History reviewed. No pertinent family history.  Social History Social History   Tobacco Use   Smoking status: Former    Packs/day: 0.50    Years: 20.00    Pack years: 10.00    Types: Cigarettes    Quit date: 05/22/2020    Years since quitting: 0.5   Smokeless tobacco: Never  Vaping Use   Vaping Use: Unknown  Substance Use Topics   Alcohol use: Not Currently   Drug use: Never     Allergies   Patient has no known allergies.   Review of Systems Review of Systems  Constitutional:  Negative for chills and fever.  Respiratory: Negative.    Cardiovascular: Negative.   Gastrointestinal: Negative.   Genitourinary: Negative.   Musculoskeletal:  Positive for joint swelling.       Rt knee pain and tenderness to lt side   Skin: Negative.   Neurological: Negative.     Physical Exam Triage Vital Signs ED Triage Vitals  Enc Vitals Group     BP 12/24/20 0831 130/89     Pulse Rate 12/24/20 0829 93     Resp 12/24/20 0829 18     Temp 12/24/20 0829 98.3 F (36.8 C)     Temp Source 12/24/20 0829 Oral     SpO2 12/24/20 0829 90 %  Weight --      Height --      Head Circumference --      Peak Flow --      Pain Score 12/24/20 0829 8     Pain Loc --      Pain Edu? --      Excl. in GC? --    No data found.  Updated Vital Signs BP 130/89 (BP Location: Left Arm)   Pulse 93   Temp 98.3 F (36.8 C) (Oral)   Resp 18   SpO2 90%   Visual Acuity Right Eye Distance:   Left Eye Distance:   Bilateral Distance:    Right Eye Near:   Left Eye Near:    Bilateral Near:     Physical Exam Constitutional:      Appearance: Normal appearance.  Cardiovascular:     Rate and Rhythm: Normal rate.  Pulmonary:     Effort: Pulmonary effort is normal.  Abdominal:     General: Abdomen is flat.  Musculoskeletal:        General: Tenderness present. No swelling or signs of injury.     Comments: Decrease rom due to tenderness to lt lateral side of knee. No edema or  erythema noted. Strong pulses. Warm , - homans   Skin:    General: Skin is warm.     Capillary Refill: Capillary refill takes less than 2 seconds.     Findings: No erythema or rash.  Neurological:     General: No focal deficit present.     Mental Status: He is alert.     UC Treatments / Results  Labs (all labs ordered are listed, but only abnormal results are displayed) Labs Reviewed - No data to display  EKG   Radiology No results found.  Procedures Procedures (including critical care time)  Medications Ordered in UC Medications - No data to display  Initial Impression / Assessment and Plan / UC Course  I have reviewed the triage vital signs and the nursing notes.  Pertinent labs & imaging results that were available during my care of the patient were reviewed by me and considered in my medical decision making (see chart for details).     Warm compress  Keep elevated if notices edema  Could be arthritis or inflammation to area  Will need to see ortho for x rays or scans  Take nsaids as needed for pain    Final Clinical Impressions(s) / UC Diagnoses   Final diagnoses:  Acute pain of right knee   Discharge Instructions   None    ED Prescriptions     Medication Sig Dispense Auth. Provider   ibuprofen (ADVIL) 800 MG tablet Take 1 tablet (800 mg total) by mouth 3 (three) times daily. 21 tablet Maple Mirza L, NP   predniSONE (STERAPRED UNI-PAK 21 TAB) 10 MG (21) TBPK tablet Take by mouth daily. Take 6 tabs by mouth daily  for 2 days, then 5 tabs for 2 days, then 4 tabs for 2 days, then 3 tabs for 2 days, 2 tabs for 2 days, then 1 tab by mouth daily for 2 days 42 tablet Coralyn Mark, NP      PDMP not reviewed this encounter.   Coralyn Mark, NP 12/24/20 [phone]

## 2020-12-28 ENCOUNTER — Ambulatory Visit (HOSPITAL_COMMUNITY): Payer: Medicaid Other

## 2021-01-28 ENCOUNTER — Ambulatory Visit: Payer: Medicaid Other | Admitting: Family

## 2021-08-24 ENCOUNTER — Other Ambulatory Visit: Payer: Self-pay | Admitting: Internal Medicine

## 2021-08-24 DIAGNOSIS — R0609 Other forms of dyspnea: Secondary | ICD-10-CM

## 2021-08-25 NOTE — Telephone Encounter (Signed)
Requested Prescriptions  Pending Prescriptions Disp Refills  . VENTOLIN HFA 108 (90 Base) MCG/ACT inhaler [Pharmacy Med Name: VENTOLIN HFA 90 MCG INHALER] 18 each 2    Sig: INHALE 2 PUFFS BY MOUTH EVERY 6 HOURS AS NEEDED FOR WHEEZE OR SHORTNESS OF BREATH     Pulmonology:  Beta Agonists 2 Passed - 08/24/2021  2:56 PM      Passed - Last BP in normal range    BP Readings from Last 1 Encounters:  12/24/20 130/89         Passed - Last Heart Rate in normal range    Pulse Readings from Last 1 Encounters:  12/24/20 93         Passed - Valid encounter within last 12 months    Recent Outpatient Visits          8 months ago DOE (dyspnea on exertion)   [facility] Marcine Matar, MD   11 months ago Hyponatremia   [facility] [place], [place], [place]

## 2022-02-26 ENCOUNTER — Ambulatory Visit (INDEPENDENT_AMBULATORY_CARE_PROVIDER_SITE_OTHER): Payer: Medicaid Other

## 2022-02-26 ENCOUNTER — Emergency Department (HOSPITAL_COMMUNITY): Payer: Medicaid Other

## 2022-02-26 ENCOUNTER — Ambulatory Visit (HOSPITAL_COMMUNITY)
Admission: EM | Admit: 2022-02-26 | Discharge: 2022-02-26 | Disposition: A | Discharge status: Home / Self Care | Payer: Medicaid Other | Attending: Internal Medicine | Admitting: Internal Medicine

## 2022-02-26 ENCOUNTER — Emergency Department (HOSPITAL_COMMUNITY)
Admission: EM | Admit: 2022-02-26 | Discharge: 2022-02-26 | Disposition: A | Discharge status: Home / Self Care | Payer: Medicaid Other | Attending: Emergency Medicine | Admitting: Emergency Medicine

## 2022-02-26 ENCOUNTER — Other Ambulatory Visit: Payer: Self-pay

## 2022-02-26 ENCOUNTER — Encounter (HOSPITAL_COMMUNITY): Payer: Self-pay | Admitting: Pharmacy Technician

## 2022-02-26 ENCOUNTER — Encounter (HOSPITAL_COMMUNITY): Payer: Self-pay

## 2022-02-26 DIAGNOSIS — R109 Unspecified abdominal pain: Secondary | ICD-10-CM | POA: Diagnosis not present

## 2022-02-26 DIAGNOSIS — M545 Low back pain, unspecified: Secondary | ICD-10-CM | POA: Diagnosis present

## 2022-02-26 DIAGNOSIS — R918 Other nonspecific abnormal finding of lung field: Secondary | ICD-10-CM

## 2022-02-26 LAB — URINALYSIS, ROUTINE W REFLEX MICROSCOPIC
Bilirubin Urine: NEGATIVE
Glucose, UA: NEGATIVE mg/dL
Hgb urine dipstick: NEGATIVE
Ketones, ur: 20 mg/dL — AB
Leukocytes,Ua: NEGATIVE
Nitrite: NEGATIVE
Protein, ur: 30 mg/dL — AB
Specific Gravity, Urine: 1.036 — ABNORMAL HIGH (ref 1.005–1.030)
pH: 5 (ref 5.0–8.0)

## 2022-02-26 LAB — POCT URINALYSIS DIPSTICK, ED / UC
Glucose, UA: NEGATIVE mg/dL
Hgb urine dipstick: NEGATIVE
Ketones, ur: 40 mg/dL — AB
Leukocytes,Ua: NEGATIVE
Nitrite: NEGATIVE
Protein, ur: 30 mg/dL — AB
Specific Gravity, Urine: 1.025 (ref 1.005–1.030)
Urobilinogen, UA: 4 mg/dL — ABNORMAL HIGH (ref 0.0–1.0)
pH: 6.5 (ref 5.0–8.0)

## 2022-02-26 LAB — COMPREHENSIVE METABOLIC PANEL
ALT: 19 U/L (ref 0–44)
AST: 23 U/L (ref 15–41)
Albumin: 3.8 g/dL (ref 3.5–5.0)
Alkaline Phosphatase: 73 U/L (ref 38–126)
Anion gap: 9 (ref 5–15)
BUN: 14 mg/dL (ref 6–20)
CO2: 24 mmol/L (ref 22–32)
Calcium: 8.8 mg/dL — ABNORMAL LOW (ref 8.9–10.3)
Chloride: 102 mmol/L (ref 98–111)
Creatinine, Ser: 1.27 mg/dL — ABNORMAL HIGH (ref 0.61–1.24)
GFR, Estimated: >60 mL/min (ref >60)
Glucose, Bld: 94 mg/dL (ref 70–99)
Potassium: 3.9 mmol/L (ref 3.5–5.1)
Sodium: 135 mmol/L (ref 135–145)
Total Bilirubin: 1 mg/dL (ref 0.3–1.2)
Total Protein: 6.3 g/dL — ABNORMAL LOW (ref 6.5–8.1)

## 2022-02-26 LAB — CBC WITH DIFFERENTIAL/PLATELET
Abs Immature Granulocytes: 0.02 10*3/uL (ref 0.00–0.07)
Basophils Absolute: 0 10*3/uL (ref 0.0–0.1)
Basophils Relative: 0 %
Eosinophils Absolute: 0.1 10*3/uL (ref 0.0–0.5)
Eosinophils Relative: 2 %
HCT: 42.3 % (ref 39.0–52.0)
Hemoglobin: 14.8 g/dL (ref 13.0–17.0)
Immature Granulocytes: 0 %
Lymphocytes Relative: 11 %
Lymphs Abs: 0.9 10*3/uL (ref 0.7–4.0)
MCH: 32.5 pg (ref 26.0–34.0)
MCHC: 35 g/dL (ref 30.0–36.0)
MCV: 92.8 fL (ref 80.0–100.0)
Monocytes Absolute: 0.7 10*3/uL (ref 0.1–1.0)
Monocytes Relative: 8 %
Neutro Abs: 6.7 10*3/uL (ref 1.7–7.7)
Neutrophils Relative %: 79 %
Platelets: 371 10*3/uL (ref 150–400)
RBC: 4.56 MIL/uL (ref 4.22–5.81)
RDW: 12.7 % (ref 11.5–15.5)
WBC: 8.5 10*3/uL (ref 4.0–10.5)
nRBC: 0 % (ref 0.0–0.2)

## 2022-02-26 MED ORDER — KETOROLAC TROMETHAMINE 30 MG/ML IJ SOLN: 30.0000 mg | Freq: Once | INTRAMUSCULAR | Status: DC

## 2022-02-26 MED ORDER — KETOROLAC TROMETHAMINE 60 MG/2ML IM SOLN
INTRAMUSCULAR | Status: AC
  Filled 2022-02-26: qty 2

## 2022-02-26 MED ORDER — KETOROLAC TROMETHAMINE 60 MG/2ML IM SOLN
60.0000 mg | Freq: Once | INTRAMUSCULAR | Status: AC
  Administered 2022-02-26: 60 mg via INTRAMUSCULAR

## 2022-02-26 NOTE — ED Notes (Signed)
Patient is being discharged from the Urgent Care and sent to the Emergency Department via personal vehicle. Per Gwenette Greet NP, patient is in need of higher level of care due to abnormal chest x-ray findings. Patient is aware and verbalizes understanding of plan of care.  Vitals:   02/26/22 1224  BP: 129/83  Pulse: 88  Resp: 18  Temp: 98.3 F (36.8 C)  SpO2: 100%

## 2022-02-26 NOTE — ED Provider Triage Note (Signed)
Emergency Medicine Provider Triage Evaluation Note  Edward Mueller , a 52 y.o. male  was evaluated in triage.  Pt complains of right lower back and flank pain.  Started about 2 days ago when he went to get out of a car, twisted, and felt "as if something was left behind me".  Went to UC today.  X-ray did not show kidney stone.  Was recommended to come to the ED for further evaluation of possible kidney stone.  Denies dysuria, increased frequency, blood in the urine, or changes in bowel habits.  Denies abdominal pain, fevers, or N/V.  States after receiving a shot of an anti-inflammatory yesterday, his pain has somewhat improved.  Review of Systems  Positive:  Negative: See above  Physical Exam  There were no vitals taken for this visit. Gen:   Awake, no distress   Resp:  Normal effort  MSK:   Moves extremities without difficulty  Other:  Mild right lower thoracic tenderness.  Abdomen soft, nondistended.  Sitting comfortably.  Medical Decision Making  Medically screening exam initiated at 3:03 PM.  Appropriate orders placed.  Edward Mueller was informed that the remainder of the evaluation will be completed by another provider, this initial triage assessment does not replace that evaluation, and the importance of remaining in the ED until their evaluation is complete.  Labs from earlier today results are not available at this time.  Reordered labs.   Prince Rome, PA-C 71/24/58 [id number]

## 2022-02-26 NOTE — ED Provider Notes (Signed)
[facility] EMERGENCY DEPARTMENT Provider Note   CSN: [id number] Arrival date & time: 02/26/22  1413     History  Chief Complaint  Patient presents with   Back Pain    Edward Mueller is a 52 y.o. male.  HPI Patient presents with back pain.  He is here with his daughter who assists with history.  Pain began yesterday as he was exiting a car.  Initially the pain was severe, worse with ambulation or motion.  Today with persistence of the pain went to urgent care.  There he was evaluated, encouraged to come here for evaluation after x-ray was performed.  He denies any urinary complaints, any systemic complaints including fever, nausea, vomiting.  Pain is focally in the right lower axilla, possibly mildly worse in the posterior.  He has no history of kidney stones, kidney disease.  He was well prior to the onset of symptoms. He denies history of asthma, bronchitis, COPD did have pneumonia about 1 year ago.    Home Medications Prior to Admission medications   Medication Sig Start Date End Date Taking? Authorizing Provider  ibuprofen (ADVIL) 800 MG tablet Take 1 tablet (800 mg total) by mouth 3 (three) times daily. 12/24/20   Marney Setting, NP  Multiple Vitamin (MULTIVITAMIN WITH MINERALS) TABS tablet Take 1 tablet by mouth daily. 06/10/20   Dwyane Dee, MD  predniSONE (STERAPRED UNI-PAK 21 TAB) 10 MG (21) TBPK tablet Take by mouth daily. Take 6 tabs by mouth daily  for 2 days, then 5 tabs for 2 days, then 4 tabs for 2 days, then 3 tabs for 2 days, 2 tabs for 2 days, then 1 tab by mouth daily for 2 days 12/24/20   Marney Setting, NP  VENTOLIN HFA 108 (90 Base) MCG/ACT inhaler INHALE 2 PUFFS BY MOUTH EVERY 6 HOURS AS NEEDED FOR WHEEZE OR SHORTNESS OF BREATH 08/25/21   Glean Hess, MD      Allergies    Patient has no known allergies.    Review of Systems   Review of Systems  All other systems reviewed and are negative.   Physical Exam Updated Vital  Signs BP 119/83 (BP Location: Right Arm)   Pulse 74   Temp 99.1 F (37.3 C) (Oral)   Resp 17   SpO2 100%  Physical Exam Vitals and nursing note reviewed.  Constitutional:      General: He is not in acute distress.    Appearance: He is well-developed.  HENT:     Head: Normocephalic and atraumatic.  Eyes:     Conjunctiva/sclera: Conjunctivae normal.  Cardiovascular:     Rate and Rhythm: Normal rate and regular rhythm.  Pulmonary:     Effort: Pulmonary effort is normal. No respiratory distress.     Breath sounds: No stridor.  Abdominal:     General: There is no distension.     Tenderness: There is no abdominal tenderness. There is no guarding.  Skin:    General: Skin is warm and dry.     Comments: Denies rash  Neurological:     Mental Status: He is alert and oriented to person, place, and time.     ED Results / Procedures / Treatments   Labs (all labs ordered are listed, but only abnormal results are displayed) Labs Reviewed  COMPREHENSIVE METABOLIC PANEL - Abnormal; Notable for the following components:      Result Value   Creatinine, Ser 1.27 (*)    Calcium 8.8 (*)  Total Protein 6.3 (*)    All other components within normal limits  URINALYSIS, ROUTINE W REFLEX MICROSCOPIC - Abnormal; Notable for the following components:   Color, Urine AMBER (*)    APPearance HAZY (*)    Specific Gravity, Urine 1.036 (*)    Ketones, ur 20 (*)    Protein, ur 30 (*)    Bacteria, UA RARE (*)    All other components within normal limits  CBC WITH DIFFERENTIAL/PLATELET    EKG None  Radiology CT Renal Stone Study  Result Date: 02/26/2022 CLINICAL DATA:  Right flank pain, abdominal pain EXAM: CT ABDOMEN AND PELVIS WITHOUT CONTRAST TECHNIQUE: Multidetector CT imaging of the abdomen and pelvis was performed following the standard protocol without IV contrast. Unenhanced CT was performed per clinician order. Lack of IV contrast limits sensitivity and specificity, especially for  evaluation of abdominal/pelvic solid viscera. RADIATION DOSE REDUCTION: This exam was performed according to the departmental dose-optimization program which includes automated exposure control, adjustment of the mA and/or kV according to patient size and/or use of iterative reconstruction technique. COMPARISON:  02/26/2022 FINDINGS: Imaging was performed with the patient in the prone position. Lower chest: No acute pleural or parenchymal lung disease. Hepatobiliary: Unremarkable unenhanced appearance of the liver and gallbladder. Pancreas: Unremarkable unenhanced appearance. Spleen: Unremarkable unenhanced appearance. Adrenals/Urinary Tract: No urinary tract calculi or obstructive uropathy within either kidney. Bladder is only minimally distended, limiting its evaluation. No gross abnormality. The adrenals are unremarkable. Stomach/Bowel: No bowel obstruction or ileus. Scattered diverticulosis of the colon. There is mild wall thickening involving the distal descending and proximal sigmoid colon, reference image 70/3. There are no significant inflammatory changes surrounding the colon however. Findings could reflect scarring from previous bouts of diverticulitis, versus mild colitis or diverticulitis in the acute setting. The appendix is not definitively identified, but there are no inflammatory changes in the right lower quadrant to suggest appendicitis. Vascular/Lymphatic: No significant vascular findings on this unenhanced exam. Multiple phleboliths are seen within the left lower pelvis. No pathologic adenopathy. Reproductive: Mild enlargement of the prostate. Other: No free fluid or free intraperitoneal gas. No abdominal wall hernia. Musculoskeletal: No acute or destructive bony lesions. Reconstructed images demonstrate no additional findings. IMPRESSION: 1. No urinary tract calculi or obstructive uropathy. 2. Segmental wall thickening of the distal descending and proximal sigmoid colon, without surrounding  inflammatory changes. Differential would include mild acute diverticulitis or colitis, versus scarring from previous bouts of diverticulitis. Please correlate with clinical presentation. 3. Mild enlargement of the prostate. Electronically Signed   By: Sharlet Salina M.D.   On: 02/26/2022 15:41   DG Abd Acute W/Chest  Result Date: 02/26/2022 CLINICAL DATA:  Right flank pain.  Possible kidney stone. EXAM: DG ABDOMEN ACUTE WITH 1 VIEW CHEST COMPARISON:  Chest x-ray 06/08/2020.  Chest CT 05/31/2020. FINDINGS: Lungs are hyperexpanded. Cavitary nodule identified peripheral right upper lobe. Numerous bilateral cavitary lesions are seen in both lungs on the previous CT scan. Insert no consolidation blunting of the costophrenic angles bilaterally may be related to scarring or small effusions. Upright film shows no evidence for intraperitoneal free air. There is no evidence for gaseous bowel dilation to suggest obstruction. No definite radiopaque stone over the expected location of either kidney or ureter. Bladder is largely obscured by gas and stool in the rectum. Multiple phleboliths are seen in the left anatomic pelvis. IMPRESSION: 1. Cavitary nodule in the peripheral right upper lobe. This is presumably residual from the widespread cavitary lung disease seen on the  previous chest CT. Follow-up CT chest may be warranted to exclude new disease. 2. No evidence for intraperitoneal free air or bowel obstruction. 3. No definite urinary stone disease. Electronically Signed   By: Kennith Center M.D.   On: 02/26/2022 13:39    Procedures Procedures    Medications Ordered in ED Medications - No data to display  ED Course/ Medical Decision Making/ A&P                           Medical Decision Making Well-appearing adult male presents with 1 day of flank pain.  Differential including musculoskeletal versus urologic less likely GI considered.  Labs CT urinalysis ordered from triage.  Patient received Toradol prior to my  evaluation and symptoms had resolved entirely at that point.  Amount and/or Complexity of Data Reviewed Independent Historian:     Details: Adult child External Data Reviewed: notes.    Details: Urgent care notes reviewed including recommendation for ED evaluation Labs:  Decision-making details documented in ED Course. Radiology:  Decision-making details documented in ED Course.   5:52 PM Patient awake, alert, ambulatory.  There was a delay in obtaining urine, but this result was not available.  Urinalysis notable for mild dehydration, otherwise no infection, no hematuria.  Labs similarly reassuring, patient appropriate for discharge with close outpatient follow-up.        Final Clinical Impression(s) / ED Diagnoses Final diagnoses:  Acute right flank pain    Rx / DC Orders ED Discharge Orders     None         Gerhard Munch, MD 02/26/22 1753

## 2022-02-26 NOTE — ED Triage Notes (Signed)
Pt here with reports of R sided back pain onset yesterday when he was getting out of the car. Pt seen at [facility] and sent here. Denies any urinary symptoms.

## 2022-02-26 NOTE — ED Triage Notes (Signed)
2-day h/o lower back pain. Pt reports getting out the car yesterday when he started with back pain. Denies any falls or trauma to his back.

## 2022-02-26 NOTE — Discharge Instructions (Addendum)
We gave you a shot of ketorolac (pain medicine) in the clinic to help with your pain.  Your x-rays show suspicious "cavity" to your right upper lung field and the radiologist recommends a CT scan of your chest due to your history of similar "cavity" to the lung.   I believe this is the cause of your discomfort.  You may also have a kidney stone that we are unable to see on the x-ray.   I would like for you to go to the ER immediately to have this worked up further due to your significant pain.

## 2022-02-26 NOTE — Discharge Instructions (Signed)
As discussed, your evaluation today has been largely reassuring.  But, it is important that you monitor your condition carefully, and do not hesitate to return to the ED if you develop new, or concerning changes in your condition.  Please use ibuprofen, 400 mg, taken with food 3 times daily for the next 2 day.  If you continue to have pain, please use Tylenol, 500 mg, up to 3 times daily for additional pain control.

## 2022-02-26 NOTE — ED Provider Notes (Signed)
[facility]    CSN: [id number] Arrival date & time: 02/26/22  1205      History   Chief Complaint Chief Complaint  Patient presents with   Back Pain    HPI Edward Mueller is a 52 y.o. male.   Patient presents urgent care with his daughter for evaluation of right flank pain/upper back pain that has since moved to the right mid/lower back that started 2 days ago as the patient was getting out of his car.  Pain has worsened over the last 2 days and states the pain has moved downward progressively.  No recent injuries or trauma to the spine/back.  No numbness or tingling to the bilateral lower extremities, saddle anesthesia symptoms, or urinary symptoms.  He denies gross hematuria, dysuria, urinary frequency, urgency, and hesitancy.  Denies nausea, vomiting, diarrhea, and chest discomfort.  Feels the pain slightly to the right lower abdomen but the pain is mostly to the back.   Last normal bowel movement was today and without blood/mucus to the stool.  States he felt a slight chills over the last 2 days without fever.  No recent viral URI symptoms.Denies personal/family history of kidney stone in the past.  He applied a muscle massage device to his chest wall/upper back and states this helped slightly but not much.  The pain has been worsening over the last 24 hours to the point where it is constant but described as a spasm sensation.  He has been taking Tylenol 1000 mg for the pain without relief (he has had 2 doses, 1 last night and 1 this morning before coming to urgent care).   Back Pain   History reviewed. No pertinent past medical history.  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 Authorizing Provider  ibuprofen (ADVIL) 800 MG tablet Take 1 tablet (800 mg total) by mouth 3 (three) times daily. 12/24/20   Marney Setting, NP  Multiple Vitamin (MULTIVITAMIN WITH MINERALS) TABS tablet Take 1 tablet by mouth daily. 06/10/20   Dwyane Dee, MD  predniSONE (STERAPRED UNI-PAK 21 TAB) 10 MG (21) TBPK tablet Take by mouth daily. Take 6 tabs by mouth daily  for 2 days, then 5 tabs for 2 days, then 4 tabs for 2 days, then 3 tabs for 2 days, 2 tabs for 2 days, then 1 tab by mouth daily for 2 days 12/24/20   Marney Setting, NP  VENTOLIN HFA 108 (90 Base) MCG/ACT inhaler INHALE 2 PUFFS BY MOUTH EVERY 6 HOURS AS NEEDED FOR WHEEZE OR SHORTNESS OF BREATH 08/25/21   Glean Hess, MD    Family History History reviewed. No pertinent family history.  Social History Social History   Tobacco Use   Smoking status: Former    Packs/day: 0.50    Years: 20.00  Total pack years: 10.00    Types: Cigarettes    Quit date: 05/22/2020    Years since quitting: 1.7   Smokeless tobacco: Never  Vaping Use   Vaping Use: Unknown  Substance Use Topics   Alcohol use: Not Currently   Drug use: Never     Allergies   Patient has no known allergies.   Review of Systems Review of Systems  Musculoskeletal:  Positive for back pain.  Per HPI   Physical Exam Triage Vital Signs ED Triage Vitals [02/26/22 1224]  Enc Vitals Group     BP 129/83     Pulse Rate 88     Resp 18     Temp 98.3 F (36.8 C)     Temp Source Oral     SpO2 100 %     Weight      Height      Head Circumference      Peak Flow      Pain Score      Pain Loc       Pain Edu?      Excl. in [place]?    No data found.  Updated Vital Signs BP 129/83 (BP Location: Left Arm)   Pulse 88   Temp 98.3 F (36.8 C) (Oral)   Resp 18   SpO2 100%   Visual Acuity Right Eye Distance:   Left Eye Distance:   Bilateral Distance:    Right Eye Near:   Left Eye Near:    Bilateral Near:     Physical Exam Vitals and nursing note reviewed.  Constitutional:      Appearance: He is ill-appearing. He is not toxic-appearing.  HENT:     Head: Normocephalic and atraumatic.     Right Ear: Hearing and external ear normal.     Left Ear: Hearing and external ear normal.     Nose: Nose normal.     Mouth/Throat:     Lips: Pink.     Mouth: Mucous membranes are moist.     Pharynx: No posterior oropharyngeal erythema.  Eyes:     General: Lids are normal. Vision grossly intact. Gaze aligned appropriately.        Right eye: No discharge.        Left eye: No discharge.     Extraocular Movements: Extraocular movements intact.     Conjunctiva/sclera: Conjunctivae normal.     Pupils: Pupils are equal, round, and reactive to light.  Cardiovascular:     Rate and Rhythm: Normal rate and regular rhythm.     Heart sounds: Normal heart sounds, S1 normal and S2 normal.  Pulmonary:     Effort: Pulmonary effort is normal. No respiratory distress.     Breath sounds: Normal breath sounds and air entry. No wheezing, rhonchi or rales.  Chest:     Chest wall: No tenderness.  Abdominal:     General: Abdomen is flat. Bowel sounds are normal.     Palpations: Abdomen is soft.     Tenderness: There is no abdominal tenderness. There is right CVA tenderness. There is no left CVA tenderness or guarding.  Musculoskeletal:     Cervical back: Normal and neck supple.     Thoracic back: Swelling and tenderness present. No deformity, signs of trauma, lacerations, spasms or bony tenderness. Decreased range of motion.     Lumbar back: Tenderness present. No swelling, edema, deformity, signs of  trauma, lacerations, spasms or bony tenderness. No scoliosis.  Back:     Right lower leg: No edema.     Left lower leg: No edema.     Comments: Slight swelling to physical exam to the right flank as compared to the left flank.  Decreased range of motion due to tenderness present upon initial assessment.  Difficult to perform full assessment as patient is seated in wheelchair due to discomfort.  He is tender to palpation along the right thoracic paraspinals and right lumbar paraspinals.  No midline tenderness to the C, T, or L-spine.  No warmth or erythema.  Lymphadenopathy:     Cervical: No cervical adenopathy.  Skin:    General: Skin is warm and dry.     Capillary Refill: Capillary refill takes less than 2 seconds.     Findings: No rash.  Neurological:     General: No focal deficit present.     Mental Status: He is alert and oriented to person, place, and time. Mental status is at baseline.     Cranial Nerves: No dysarthria or facial asymmetry.  Psychiatric:        Mood and Affect: Mood normal.        Speech: Speech normal.        Behavior: Behavior normal.        Thought Content: Thought content normal.        Judgment: Judgment normal.      UC Treatments / Results  Labs (all labs ordered are listed, but only abnormal results are displayed) Labs Reviewed  POCT URINALYSIS DIPSTICK, ED / UC - Abnormal; Notable for the following components:      Result Value   Bilirubin Urine SMALL (*)    Ketones, ur 40 (*)    Protein, ur 30 (*)    Urobilinogen, UA 4.0 (*)    All other components within normal limits  CBC  COMPREHENSIVE METABOLIC PANEL    EKG   Radiology DG Abd Acute W/Chest  Result Date: 02/26/2022 CLINICAL DATA:  Right flank pain.  Possible kidney stone. EXAM: DG ABDOMEN ACUTE WITH 1 VIEW CHEST COMPARISON:  Chest x-ray 06/08/2020.  Chest CT 05/31/2020. FINDINGS: Lungs are hyperexpanded. Cavitary nodule identified peripheral right upper lobe. Numerous bilateral  cavitary lesions are seen in both lungs on the previous CT scan. Insert no consolidation blunting of the costophrenic angles bilaterally may be related to scarring or small effusions. Upright film shows no evidence for intraperitoneal free air. There is no evidence for gaseous bowel dilation to suggest obstruction. No definite radiopaque stone over the expected location of either kidney or ureter. Bladder is largely obscured by gas and stool in the rectum. Multiple phleboliths are seen in the left anatomic pelvis. IMPRESSION: 1. Cavitary nodule in the peripheral right upper lobe. This is presumably residual from the widespread cavitary lung disease seen on the previous chest CT. Follow-up CT chest may be warranted to exclude new disease. 2. No evidence for intraperitoneal free air or bowel obstruction. 3. No definite urinary stone disease. Electronically Signed   By: Kennith Center M.D.   On: 02/26/2022 13:39    Procedures Procedures (including critical care time)  Medications Ordered in UC Medications  ketorolac (TORADOL) injection 60 mg (60 mg Intramuscular Given 02/26/22 1244)    Initial Impression / Assessment and Plan / UC Course  I have reviewed the triage vital signs and the nursing notes.  Pertinent labs & imaging results that were available during my care of the patient were reviewed by me and considered in  my medical decision making (see chart for details).   1.  Right flank pain, abnormal lung on chest x-ray Presentation is concerning for possible nephrolithiasis based on descriptors and clinical appearance.  Patient given ketorolac IM 60 mg which significantly improved his discomfort on recheck after approximately 30 to 45 minutes.  When patient first arrived to urgent care, he was unable to walk due to discomfort.  Upon leaving, he is ambulatory with normal gait without assistance and reports significant improvement in right flank pain.  Urine is unremarkable for signs of urinary tract  infection but does show protein, bilirubin, ketones, and urobilinogen without hematuria.  CBC and CMP drawn to assess electrolyte levels and blood levels prior to x-ray resulted.   Acute abdomen with chest x-ray shows cavitary nodule in the peripheral right upper lobe similar to previous finding on chest CT.  Radiologist recommends follow-up chest CT to exclude new disease.  No visible urinary stone disease on abdominal imaging.  It is reassuring that patient's subjective discomfort has improved significantly, however recommend further workup in the ER immediately due to findings on abdominal chest series as well as to rule out obstructing urinary stone based on presentation prior to administration of ketorolac.  Discussed risks of deferring emergency department visit, patient voices understanding and agreement with plan.  Daughter to transport patient to the nearest emergency department vital signs are stable.  Discharged in stable/ambulatory condition from urgent care to the nearest emergency room.  Final Clinical Impressions(s) / UC Diagnoses   Final diagnoses:  Right flank pain  Abnormality of lung on chest x-ray     Discharge Instructions      We gave you a shot of ketorolac (pain medicine) in the clinic to help with your pain.  Your x-rays show suspicious "cavity" to your right upper lung field and the radiologist recommends a CT scan of your chest due to your history of similar "cavity" to the lung.   I believe this is the cause of your discomfort.  You may also have a kidney stone that we are unable to see on the x-ray.   I would like for you to go to the ER immediately to have this worked up further due to your significant pain.     ED Prescriptions   None    PDMP not reviewed this encounter.   Talbot Grumbling, [place] 02/26/22 1438

## 2022-05-08 IMAGING — CT CT CHEST W/O CM
2 of 3 series · 15 of 36 positions shown, 18 images · non-contrast
Comparison: None.

CLINICAL DATA: Chest pain and shortness of breath.

EXAM:
CT CHEST WITHOUT CONTRAST
TECHNIQUE: Multidetector CT imaging of the chest was performed following the
standard protocol without IV contrast.

[Series 3: chest w/o 2mm st · axial · non-contrast · 0.67mm/px · z∈[+1230,+1514]mm · 12 of 168 slices shown, 15 images]
[im 13/168  mediastinal]
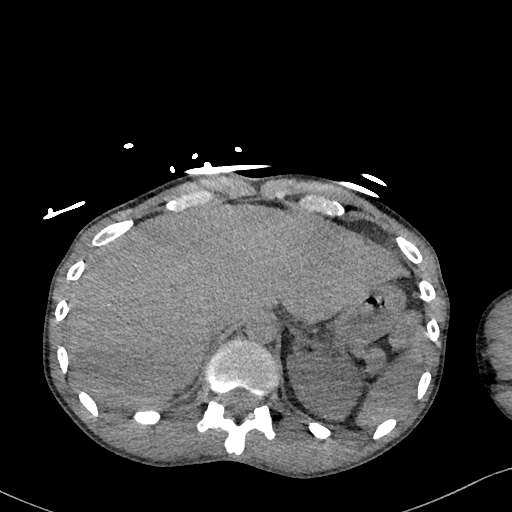
[im 13/168  lung]
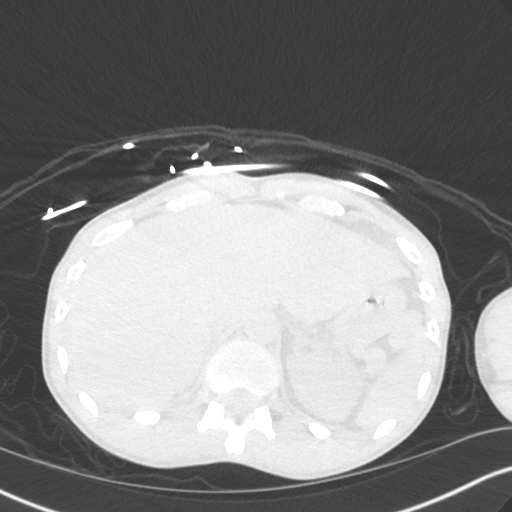
[im 25/168  lung]
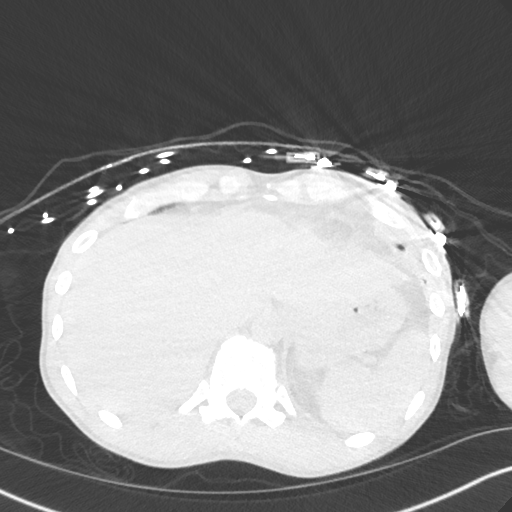
[im 38/168  lung]
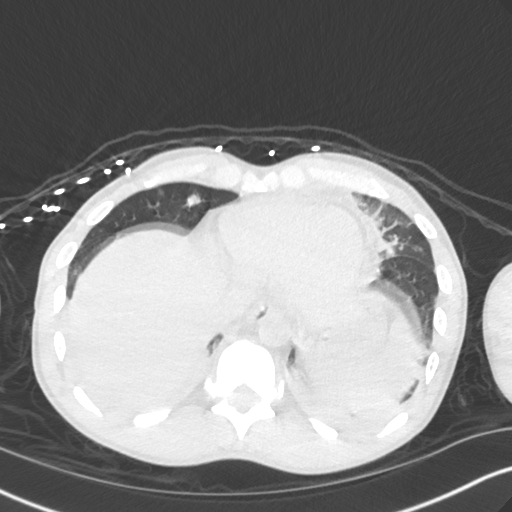
[im 50/168  lung]
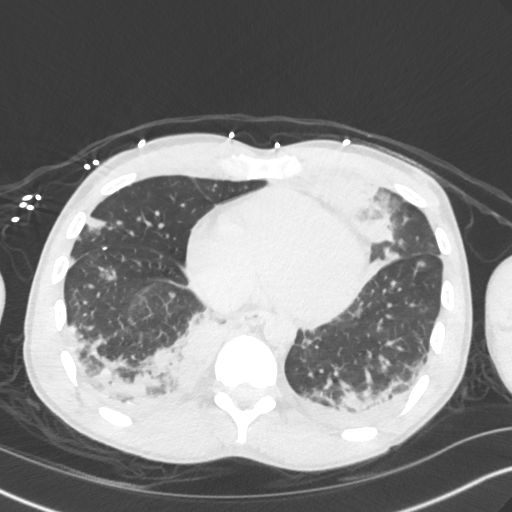
[im 62/168  mediastinal]
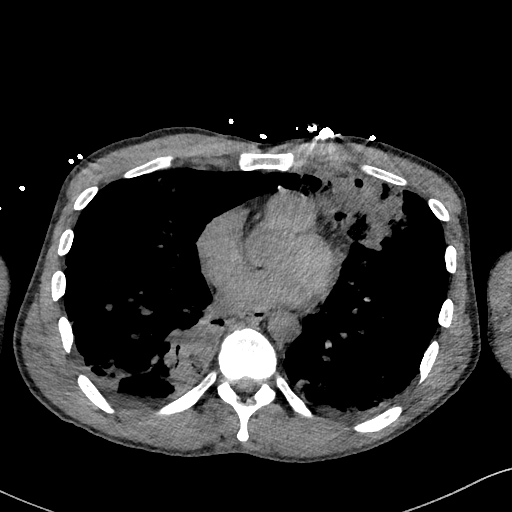
[im 62/168  lung]
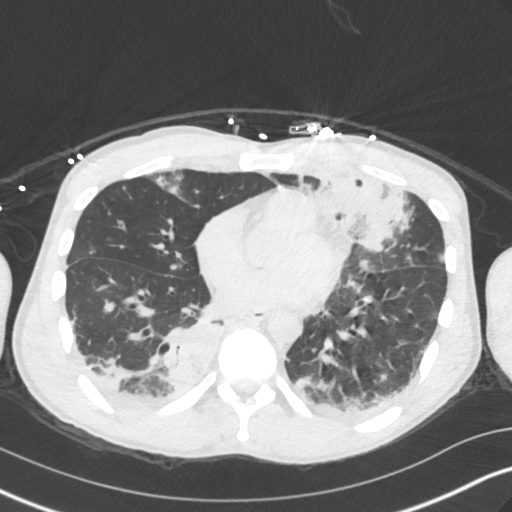
[im 75/168  lung]
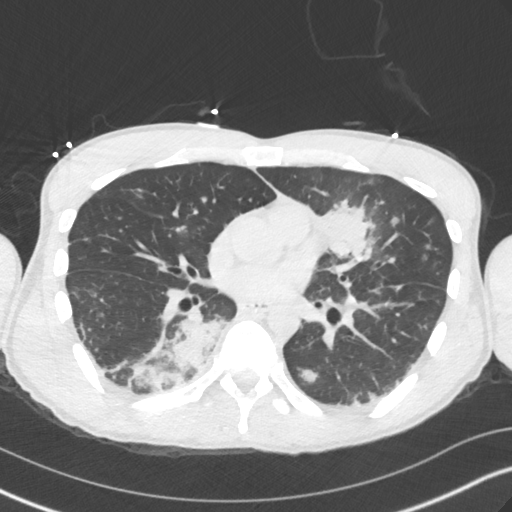
[im 93/168  lung]
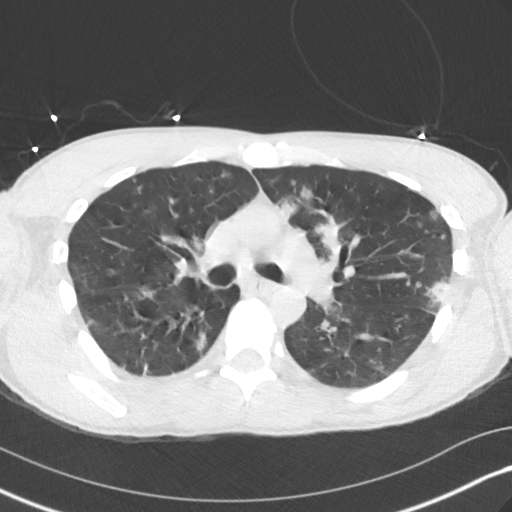
[im 106/168  lung]
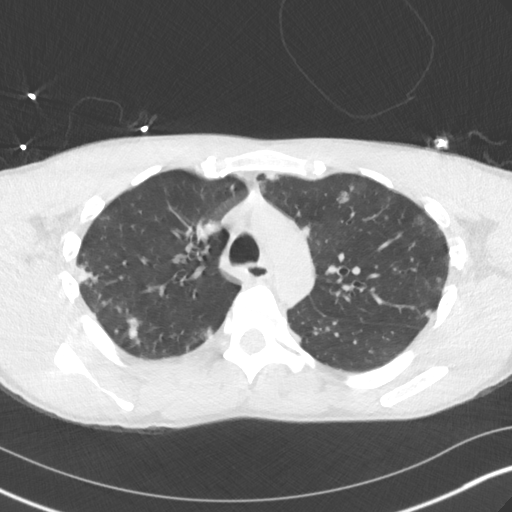
[im 118/168  mediastinal]
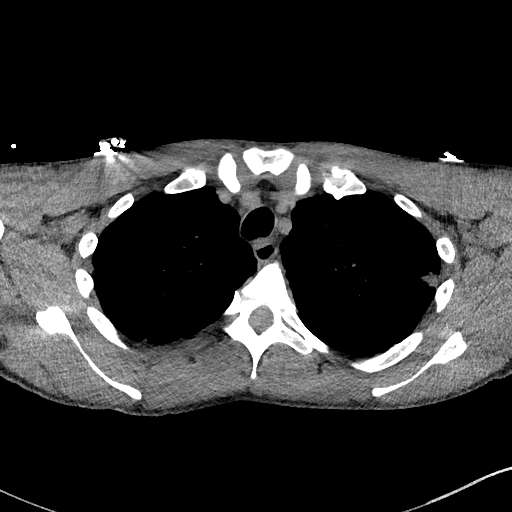
[im 118/168  lung]
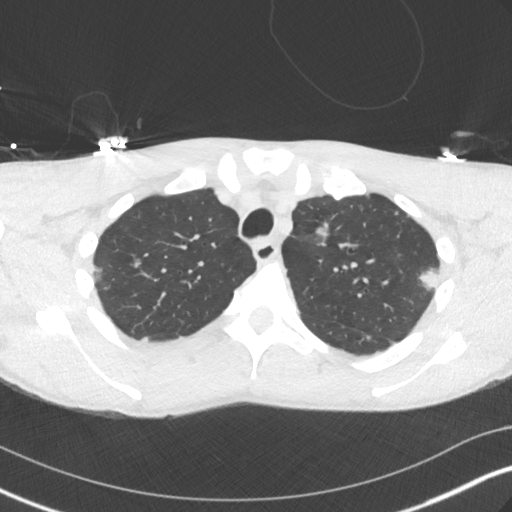
[im 130/168  lung]
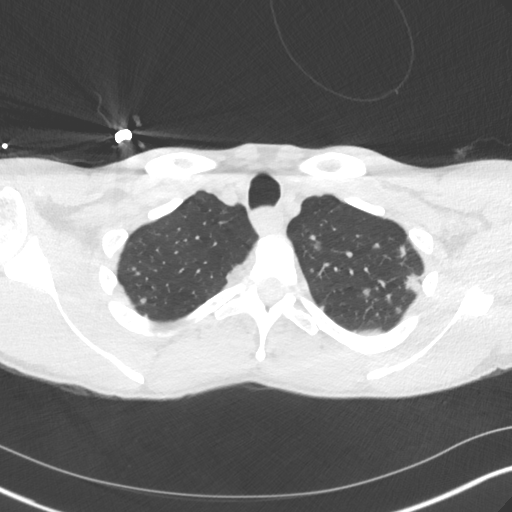
[im 143/168  lung]
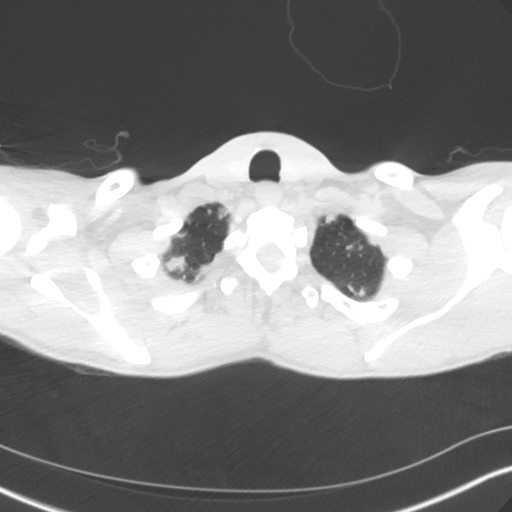
[im 155/168  lung]
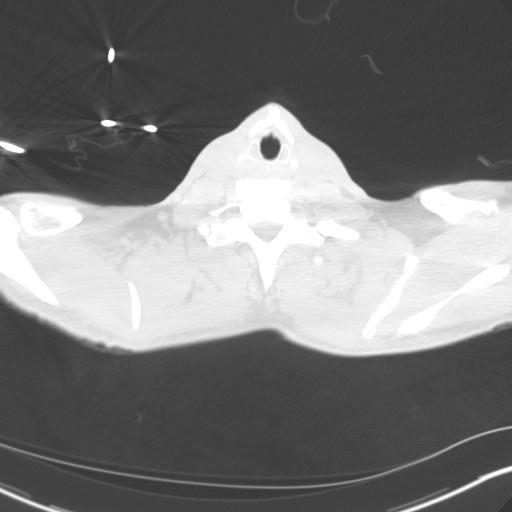

[Series 5: chest w/o 2mm st cor · coronal · non-contrast · 0.65mm/px · 3 of 114 slices shown]
[im 23/114  lung]
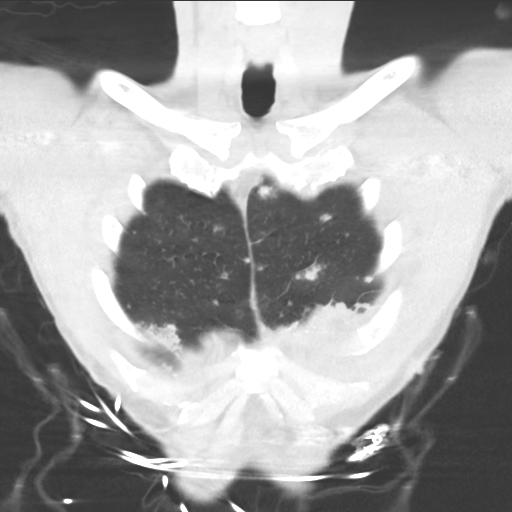
[im 46/114  lung]
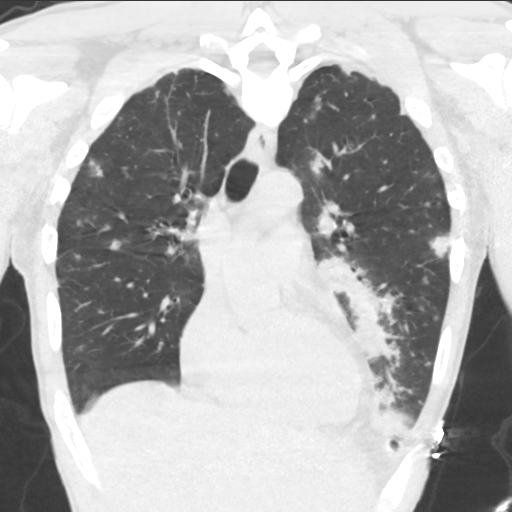
[im 68/114  lung]
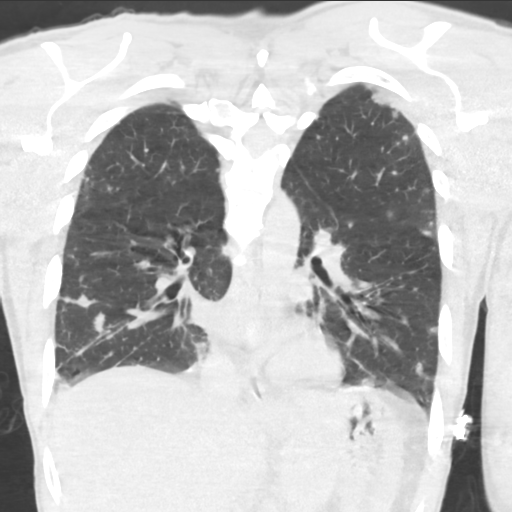

[15 of 36 positions shown; findings below may reference images not displayed]

FINDINGS: Cardiovascular: No significant vascular findings. Normal heart size.
No pericardial effusion.

Mediastinum/Nodes: No enlarged mediastinal or axillary lymph nodes.
Thyroid gland, trachea, and esophagus demonstrate no significant
findings.

Lungs/Pleura: Innumerable ill-defined bilateral noncalcified nodular
appearing areas are seen throughout both lungs.

Marked severity, patchy anteromedial left upper lobe and bilateral
lower lobe infiltrates are present.

A very small left pleural effusion is seen.

No pneumothorax is identified.

Upper Abdomen: No acute abnormality.

Musculoskeletal: No chest wall mass or suspicious bone lesions
identified.
IMPRESSION: 1. Marked severity left upper lobe and bilateral lower lobe
infiltrates with innumerable bilateral nodular appearing areas, as
described above. While this may be, in part, infectious in etiology,
sequelae associated with pulmonary metastasis cannot be excluded.
2. Very small left pleural effusion.
